# Patient Record
Sex: Female | Born: 1955 | Race: White | Hispanic: No | Marital: Married | Smoking: Never smoker
Health system: Southern US, Community
[De-identification: ages and names within clinical notes are randomized; demographics above are authoritative.]

## PROBLEM LIST (undated history)

## (undated) DIAGNOSIS — Z460 Encounter for fitting and adjustment of spectacles and contact lenses: Secondary | ICD-10-CM

## (undated) DIAGNOSIS — E785 Hyperlipidemia, unspecified: Secondary | ICD-10-CM

## (undated) DIAGNOSIS — H3530 Unspecified macular degeneration: Secondary | ICD-10-CM

## (undated) HISTORY — PX: COLONOSCOPY: SHX174

## (undated) HISTORY — DX: Unspecified macular degeneration: H35.30

## (undated) HISTORY — DX: Hyperlipidemia, unspecified: E78.5

---

## 1997-06-16 HISTORY — PX: GANGLION CYST EXCISION: SHX1691

## 1997-06-30 ENCOUNTER — Other Ambulatory Visit: Admission: RE | Admit: 1997-06-30 | Discharge: 1997-06-30 | Payer: Self-pay | Admitting: Obstetrics and Gynecology

## 1997-07-01 ENCOUNTER — Other Ambulatory Visit: Admission: RE | Admit: 1997-07-01 | Discharge: 1997-07-01 | Payer: Self-pay | Admitting: Orthopedic Surgery

## 1999-02-22 ENCOUNTER — Other Ambulatory Visit: Admission: RE | Admit: 1999-02-22 | Discharge: 1999-02-22 | Payer: Self-pay | Admitting: Obstetrics and Gynecology

## 2000-03-07 ENCOUNTER — Other Ambulatory Visit: Admission: RE | Admit: 2000-03-07 | Discharge: 2000-03-07 | Payer: Self-pay | Admitting: Obstetrics and Gynecology

## 2001-05-01 ENCOUNTER — Other Ambulatory Visit: Admission: RE | Admit: 2001-05-01 | Discharge: 2001-05-01 | Payer: Self-pay | Admitting: Obstetrics and Gynecology

## 2002-05-13 ENCOUNTER — Other Ambulatory Visit: Admission: RE | Admit: 2002-05-13 | Discharge: 2002-05-13 | Payer: Self-pay | Admitting: Obstetrics and Gynecology

## 2003-05-18 ENCOUNTER — Other Ambulatory Visit: Admission: RE | Admit: 2003-05-18 | Discharge: 2003-05-18 | Payer: Self-pay | Admitting: Obstetrics and Gynecology

## 2004-04-22 ENCOUNTER — Ambulatory Visit (HOSPITAL_COMMUNITY): Admission: RE | Admit: 2004-04-22 | Discharge: 2004-04-22 | Payer: Self-pay | Admitting: Gastroenterology

## 2004-04-22 ENCOUNTER — Encounter (INDEPENDENT_AMBULATORY_CARE_PROVIDER_SITE_OTHER): Payer: Self-pay | Admitting: Specialist

## 2004-05-18 ENCOUNTER — Other Ambulatory Visit: Admission: RE | Admit: 2004-05-18 | Discharge: 2004-05-18 | Payer: Self-pay | Admitting: Obstetrics and Gynecology

## 2004-09-13 ENCOUNTER — Ambulatory Visit: Payer: Self-pay | Admitting: Family Medicine

## 2006-08-14 ENCOUNTER — Ambulatory Visit: Payer: Self-pay | Admitting: Family Medicine

## 2006-08-14 DIAGNOSIS — L258 Unspecified contact dermatitis due to other agents: Secondary | ICD-10-CM | POA: Insufficient documentation

## 2006-08-24 ENCOUNTER — Ambulatory Visit: Payer: Self-pay | Admitting: Family Medicine

## 2006-08-28 ENCOUNTER — Telehealth (INDEPENDENT_AMBULATORY_CARE_PROVIDER_SITE_OTHER): Payer: Self-pay | Admitting: *Deleted

## 2006-08-31 ENCOUNTER — Ambulatory Visit: Payer: Self-pay | Admitting: Family Medicine

## 2006-08-31 DIAGNOSIS — R21 Rash and other nonspecific skin eruption: Secondary | ICD-10-CM | POA: Insufficient documentation

## 2006-09-03 ENCOUNTER — Telehealth (INDEPENDENT_AMBULATORY_CARE_PROVIDER_SITE_OTHER): Payer: Self-pay | Admitting: *Deleted

## 2006-09-05 LAB — CONVERTED CEMR LAB: Anti Nuclear Antibody(ANA): NEGATIVE

## 2006-09-07 LAB — CONVERTED CEMR LAB
AST: 19 units/L (ref 0–37)
Albumin: 4.1 g/dL (ref 3.5–5.2)
BUN: 7 mg/dL (ref 6–23)
Basophils Absolute: 0 10*3/uL (ref 0.0–0.1)
Chloride: 111 meq/L (ref 96–112)
Eosinophils Absolute: 0.2 10*3/uL (ref 0.0–0.6)
GFR calc non Af Amer: 93 mL/min
HCT: 42 % (ref 36.0–46.0)
MCHC: 35.6 g/dL (ref 30.0–36.0)
MCV: 94 fL (ref 78.0–100.0)
Monocytes Relative: 7.2 % (ref 3.0–11.0)
Neutrophils Relative %: 71.4 % (ref 43.0–77.0)
Platelets: 292 10*3/uL (ref 150–400)
Potassium: 4 meq/L (ref 3.5–5.1)
RBC: 4.47 M/uL (ref 3.87–5.11)
Sed Rate: 8 mm/hr (ref 0–25)
Sodium: 144 meq/L (ref 135–145)

## 2006-09-24 ENCOUNTER — Encounter: Payer: Self-pay | Admitting: Family Medicine

## 2007-07-17 ENCOUNTER — Emergency Department (HOSPITAL_COMMUNITY): Admission: EM | Admit: 2007-07-17 | Discharge: 2007-07-17 | Payer: Self-pay | Admitting: Family Medicine

## 2007-09-24 ENCOUNTER — Encounter: Payer: Self-pay | Admitting: Family Medicine

## 2007-10-09 ENCOUNTER — Encounter: Payer: Self-pay | Admitting: Family Medicine

## 2007-12-09 ENCOUNTER — Ambulatory Visit: Payer: Self-pay | Admitting: Family Medicine

## 2007-12-09 DIAGNOSIS — E785 Hyperlipidemia, unspecified: Secondary | ICD-10-CM | POA: Insufficient documentation

## 2008-01-01 ENCOUNTER — Telehealth: Payer: Self-pay | Admitting: Family Medicine

## 2008-01-01 ENCOUNTER — Emergency Department (HOSPITAL_COMMUNITY): Admission: EM | Admit: 2008-01-01 | Discharge: 2008-01-01 | Payer: Self-pay | Admitting: Emergency Medicine

## 2008-01-02 ENCOUNTER — Encounter (INDEPENDENT_AMBULATORY_CARE_PROVIDER_SITE_OTHER): Payer: Self-pay | Admitting: *Deleted

## 2008-01-03 ENCOUNTER — Encounter (INDEPENDENT_AMBULATORY_CARE_PROVIDER_SITE_OTHER): Payer: Self-pay | Admitting: *Deleted

## 2008-01-13 ENCOUNTER — Encounter: Payer: Self-pay | Admitting: Family Medicine

## 2008-03-06 ENCOUNTER — Encounter: Payer: Self-pay | Admitting: Family Medicine

## 2008-05-27 LAB — HM MAMMOGRAPHY: HM Mammogram: NORMAL

## 2008-09-30 ENCOUNTER — Ambulatory Visit (HOSPITAL_COMMUNITY): Admission: RE | Admit: 2008-09-30 | Discharge: 2008-09-30 | Payer: Self-pay | Admitting: Obstetrics and Gynecology

## 2009-03-31 ENCOUNTER — Encounter (INDEPENDENT_AMBULATORY_CARE_PROVIDER_SITE_OTHER): Payer: Self-pay | Admitting: *Deleted

## 2009-03-31 ENCOUNTER — Ambulatory Visit: Payer: Self-pay | Admitting: Family Medicine

## 2009-03-31 DIAGNOSIS — M722 Plantar fascial fibromatosis: Secondary | ICD-10-CM | POA: Insufficient documentation

## 2009-03-31 LAB — HM COLONOSCOPY

## 2009-04-09 ENCOUNTER — Ambulatory Visit: Payer: Self-pay | Admitting: Family Medicine

## 2009-04-12 ENCOUNTER — Telehealth (INDEPENDENT_AMBULATORY_CARE_PROVIDER_SITE_OTHER): Payer: Self-pay | Admitting: *Deleted

## 2009-04-12 LAB — CONVERTED CEMR LAB: Fecal Occult Bld: NEGATIVE

## 2009-07-05 ENCOUNTER — Ambulatory Visit: Payer: Self-pay | Admitting: Family Medicine

## 2009-07-06 LAB — CONVERTED CEMR LAB
ALT: 23 units/L (ref 0–35)
AST: 24 units/L (ref 0–37)
Alkaline Phosphatase: 65 units/L (ref 39–117)
Bilirubin, Direct: 0.1 mg/dL (ref 0.0–0.3)
Cholesterol: 170 mg/dL (ref 0–200)
Total Protein: 7.2 g/dL (ref 6.0–8.3)

## 2009-07-26 ENCOUNTER — Telehealth: Payer: Self-pay | Admitting: Family Medicine

## 2009-07-26 ENCOUNTER — Ambulatory Visit: Payer: Self-pay | Admitting: Family Medicine

## 2009-07-26 LAB — CONVERTED CEMR LAB
Ketones, urine, test strip: NEGATIVE
Nitrite: NEGATIVE
Specific Gravity, Urine: 1.015
Urobilinogen, UA: 0.2
pH: 6

## 2009-07-27 ENCOUNTER — Encounter: Payer: Self-pay | Admitting: Family Medicine

## 2009-08-09 ENCOUNTER — Encounter: Payer: Self-pay | Admitting: Family Medicine

## 2009-10-05 ENCOUNTER — Ambulatory Visit: Payer: Self-pay | Admitting: Family Medicine

## 2009-10-06 LAB — CONVERTED CEMR LAB
ALT: 21 units/L (ref 0–35)
Alkaline Phosphatase: 60 units/L (ref 39–117)
Bilirubin, Direct: 0.1 mg/dL (ref 0.0–0.3)
Total Bilirubin: 0.5 mg/dL (ref 0.3–1.2)
VLDL: 24.4 mg/dL (ref 0.0–40.0)

## 2009-10-21 ENCOUNTER — Telehealth (INDEPENDENT_AMBULATORY_CARE_PROVIDER_SITE_OTHER): Payer: Self-pay | Admitting: *Deleted

## 2010-01-04 ENCOUNTER — Ambulatory Visit: Payer: Self-pay | Admitting: Family Medicine

## 2010-01-07 LAB — CONVERTED CEMR LAB
Albumin: 4.2 g/dL (ref 3.5–5.2)
Alkaline Phosphatase: 58 units/L (ref 39–117)
LDL Cholesterol: 80 mg/dL (ref 0–99)
Total CHOL/HDL Ratio: 3
Triglycerides: 73 mg/dL (ref 0.0–149.0)

## 2010-02-13 LAB — CONVERTED CEMR LAB
AST: 34 units/L (ref 0–37)
Albumin: 4.3 g/dL (ref 3.5–5.2)
Albumin: 4.3 g/dL (ref 3.5–5.2)
Alkaline Phosphatase: 57 units/L (ref 39–117)
BUN: 11 mg/dL (ref 6–23)
Basophils Absolute: 0 10*3/uL (ref 0.0–0.1)
Chloride: 105 meq/L (ref 96–112)
Chloride: 106 meq/L (ref 96–112)
Cholesterol: 207 mg/dL — ABNORMAL HIGH (ref 0–200)
Cholesterol: 219 mg/dL (ref 0–200)
Creatinine, Ser: 0.7 mg/dL (ref 0.4–1.2)
Direct LDL: 149.7 mg/dL
Eosinophils Absolute: 0.2 10*3/uL (ref 0.0–0.7)
GFR calc non Af Amer: 92 mL/min (ref >60)
Glucose, Bld: 107 mg/dL — ABNORMAL HIGH (ref 70–99)
HCT: 43.1 % (ref 36.0–46.0)
HCT: 43.8 % (ref 36.0–46.0)
Hemoglobin: 14.3 g/dL (ref 12.0–15.0)
Lymphs Abs: 1.2 10*3/uL (ref 0.7–4.0)
MCHC: 33.2 g/dL (ref 30.0–36.0)
MCV: 96 fL (ref 78.0–100.0)
Neutro Abs: 3.2 10*3/uL (ref 1.4–7.7)
Platelets: 246 10*3/uL (ref 150.0–400.0)
Platelets: 246 10*3/uL (ref 150–400)
Potassium: 4 meq/L (ref 3.5–5.1)
Potassium: 4.2 meq/L (ref 3.5–5.1)
RDW: 11.9 % (ref 11.5–14.6)
RDW: 12 % (ref 11.5–14.6)
TSH: 2.21 microintl units/mL (ref 0.35–5.50)
Total Bilirubin: 0.5 mg/dL (ref 0.3–1.2)
Total CHOL/HDL Ratio: 4
Total Protein: 7.2 g/dL (ref 6.0–8.3)
Triglycerides: 98 mg/dL (ref 0–149)
VLDL: 17.6 mg/dL (ref 0.0–40.0)

## 2010-02-17 NOTE — Progress Notes (Signed)
Summary: Triage: ? Side effect to med  Phone Note Call from Patient Call back at [phone]   Caller: Patient Summary of Call: Message left on Triage VM: Patient with high fever (99.4-102.3), abdominal pain, back pain, dizziness and weakness. Patient was recently started on Trilipix and wonders if sympotoms related to med. Please call patient states she really feels bad.    Chrae Malloy  July 26, 2009 9:23 AM   Follow-up for Phone Call        SPOKE WITH PT ADVISE PT TO D/C MED. OFFER PT OV TODAY  WITH ANOTHER PHYSICIAN PT DECLINE, PT COMING IN AM PT ADVISE MONITOR symptoms AND IF WORSEN ADVISE ED PT OK..............Marland KitchenFelecia Deloach CMA  July 26, 2009 9:39 AM

## 2010-02-17 NOTE — Assessment & Plan Note (Signed)
Summary: temp - body ache/cbs   Vital Signs:  Patient profile:   55 year old female Weight:      152 pounds Temp:     97.6 degrees F oral BP sitting:   130 / 74  (left arm)  Vitals Entered By: Doristine Devoid (July 26, 2009 12:02 PM) CC: fever up to 102 some lower back pain also- no nausea vomiting    History of Present Illness: Rachel Kline here today w/ fever to 102.3.  sxs started Friday afternoon w/ back aches.  fever started Sat and persisted through Sunday.  no fever this AM.  + lightheaded, anorexia, fatigue.  'i just don't feel good'.  no N/V/D.  no abd pain.  no URI sxs.  denies dysuria, frequency, urgency.  Current Medications (verified): 1)  Ambien 10 Mg  Tabs (Zolpidem Tartrate) .... As Needed 2)  Glycopyrrolate 0.2 Mg/ml Soln (Glycopyrrolate) .... As Needed. 3)  Mvi 4)  Vision Formula  Tabs (Multiple Vitamins-Minerals) .... Two Times A Day 5)  Red Yeast Rice Extract  Powd (Red Yeast Rice Extract) .... Two Times A Day 6)  Coq10 30 Mg Caps (Coenzyme Q10) .... Two Times A Day 7)  Omega-3 350 Mg Caps (Omega-3 Fatty Acids) 8)  Aspir-Low 81 Mg Tbec (Aspirin) .... Two Times A Day 9)  Cetirizine Hcl 10 Mg Tabs (Cetirizine Hcl) .... Daily 10)  Miralax  Powd (Polyethylene Glycol 3350) .... As Needed 11)  Pravachol 40 Mg Tabs (Pravastatin Sodium) .Marland Kitchen.. 1 By Mouth At Bedtime. 12)  Trilipix 135 Mg Cpdr (Choline Fenofibrate) .Marland Kitchen.. 1 By Mouth Once Daily.  Allergies (verified): No Known Drug Allergies  Review of Systems      See HPI  Physical Exam  General:  Well-developed,well-nourished,in no acute distress; alert,appropriate and cooperative throughout examination Head:  Normocephalic and atraumatic without obvious abnormalities. No apparent alopecia or balding.  no TTP over sinuses Eyes:  no injxn or inflammation Ears:  External ear exam shows no significant lesions or deformities.  Otoscopic examination reveals clear canals, tympanic membranes are intact bilaterally without  bulging, retraction, inflammation or discharge. Hearing is grossly normal bilaterally. Neck:  No deformities, masses, or tenderness noted Lungs:  Normal respiratory effort, chest expands symmetrically. Lungs are clear to auscultation, no crackles or wheezes. Heart:  Normal rate and regular rhythm. S1 and S2 normal without gallop, murmur, click, rub or other extra sounds. Abdomen:  soft, NT/ND, +BS.  + CVA tenderness   Impression & Recommendations:  Problem # 1:  FEVER (ICD-780.60) Assessment New apparent source is urine given + leuks.  also w/ CVA tenderness.  start cipro.  send urine for cx.  reviewed supportive care and red flags that should prompt return.  Pt expresses understanding and is in agreement w/ this plan. Orders: Specimen Handling (04540) T-Culture, Urine ([id number]) UA Dipstick w/o Micro (manual) (81002)  Complete Medication List: 1)  Ambien 10 Mg Tabs (Zolpidem tartrate) .... As needed 2)  Glycopyrrolate 0.2 Mg/ml Soln (Glycopyrrolate) .... As needed. 3)  Mvi  4)  Vision Formula Tabs (Multiple vitamins-minerals) .... Two times a day 5)  Red Yeast Rice Extract Powd (Red yeast rice extract) .... Two times a day 6)  Coq10 30 Mg Caps (Coenzyme q10) .... Two times a day 7)  Omega-3 350 Mg Caps (Omega-3 fatty acids) 8)  Aspir-low 81 Mg Tbec (Aspirin) .... Two times a day 9)  Cetirizine Hcl 10 Mg Tabs (Cetirizine hcl) .... Daily 10)  Miralax Powd (Polyethylene glycol 3350) .Marland KitchenMarland KitchenMarland Kitchen  As needed 11)  Pravachol 40 Mg Tabs (Pravastatin sodium) .Marland Kitchen.. 1 by mouth at bedtime. 12)  Trilipix 135 Mg Cpdr (Choline fenofibrate) .Marland Kitchen.. 1 by mouth once daily. 13)  Cipro 500 Mg Tabs (Ciprofloxacin hcl) .Marland Kitchen.. 1 by mouth 2 times daily x5 days  Patient Instructions: 1)  Please call if symptoms continue or are worsening 2)  Start the Cipro two times a day as directed- take w/ food. 3)  Drink plenty of fluids 4)  Tylenol/Ibuprofen as needed for pain or fever 5)  Stop the Trilipix as this may be  causing muscle aches 6)  We'll notify you of your urine culture 7)  Call with any questions or concerns 8)  Hang in there! Prescriptions: CIPRO 500 MG TABS (CIPROFLOXACIN HCL) 1 by mouth 2 times daily x5 days  #10 x 0   Entered and Authorized by:   Neena Rhymes MD   Signed by:   Neena Rhymes MD on 07/26/2009   Method used:   Electronically to        [facility]* (retail)       [address]       [address] Shipping/mailing       [place], [place]  [address]       Ph: [phone]       Fax: [phone]   RxID:   [id number]   Laboratory Results   Urine Tests    Routine Urinalysis   Glucose: negative   (Normal Range: Negative) Bilirubin: negative   (Normal Range: Negative) Ketone: negative   (Normal Range: Negative) Spec. Gravity: 1.015   (Normal Range: 1.003-1.035) Blood: negative   (Normal Range: Negative) pH: 6.0   (Normal Range: 5.0-8.0) Protein: negative   (Normal Range: Negative) Urobilinogen: 0.2   (Normal Range: 0-1) Nitrite: negative   (Normal Range: Negative) Leukocyte Esterace: small   (Normal Range: Negative)

## 2010-02-17 NOTE — Assessment & Plan Note (Signed)
Summary: CPX,FASTING,UMR INS/RH....   Vital Signs:  Patient profile:   55 year old female Height:      65 inches Weight:      160 pounds BMI:     26.72 Temp:     97.7 degrees F oral Pulse rate:   82 / minute Pulse rhythm:   regular BP sitting:   122 / 84  (left arm) Cuff size:   large  Vitals Entered By: Army Fossa CMA (March 31, 2009 8:34 AM) CC: cpx, no pap.    History of Present Illness: Pt here for cpe, labs.  No pap--pt has gyn.    Preventive Screening-Counseling & Management  Alcohol-Tobacco     Alcohol drinks/day: <1     Smoking Status: never     Passive Smoke Exposure: no  Caffeine-Diet-Exercise     Caffeine use/day: 1     Does Patient Exercise: yes     Type of exercise: treadmill     Exercise (avg: min/session): 30-60     Times/week: <3     Exercise Counseling: to improve exercise regimen  Hep-HIV-STD-Contraception     HIV Risk: no     Dental Visit-last 6 months yes     Dental Care Counseling: not indicated; dental care within six months     SBE monthly: yes  Safety-Violence-Falls     Seat Belt Use: 100      Sexual History:  currently monogamous.    Current Medications (verified): 1)  Ambien 10 Mg  Tabs (Zolpidem Tartrate) .... As Needed 2)  Glycopyrrolate 0.2 Mg/ml Soln (Glycopyrrolate) .... As Needed. 3)  Mvi 4)  Vision Formula  Tabs (Multiple Vitamins-Minerals) .... Two Times A Day 5)  Red Yeast Rice Extract  Powd (Red Yeast Rice Extract) .... Two Times A Day 6)  Coq10 30 Mg Caps (Coenzyme Q10) .... Two Times A Day 7)  Omega-3 350 Mg Caps (Omega-3 Fatty Acids) 8)  Aspir-Low 81 Mg Tbec (Aspirin) .... Two Times A Day 9)  Cetirizine Hcl 10 Mg Tabs (Cetirizine Hcl) .... Daily 10)  Miralax  Powd (Polyethylene Glycol 3350) .... 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 Family History of CAD Female 1st degree relative <50 Family History Diabetes 1st degree relative Family History High cholesterol Family History Hypertension M=pancreatic cancer F--CHF, end stage renal failure  Social History: Reviewed history from 12/09/2007 and no changes required. 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 Eyes:  Denies blurring, discharge, double vision, eye irritation, eye pain, halos, itching,  light sensitivity, red eye, vision loss-1 eye, and vision loss-both eyes; optho--q1y. ENT:  Denies decreased hearing, difficulty swallowing, ear discharge, earache, hoarseness, nasal congestion, nosebleeds, postnasal drainage, ringing in ears, sinus pressure, and sore throat. CV:  Denies bluish discoloration of lips or nails, chest pain or discomfort, difficulty breathing at night, difficulty breathing while lying down, fainting, fatigue, leg cramps with exertion,  lightheadness, near fainting, palpitations, shortness of breath with exertion, swelling of feet, swelling of hands, and weight gain. Resp:  Denies chest discomfort, chest pain with inspiration, cough, coughing up blood, excessive snoring, hypersomnolence, morning headaches, pleuritic, shortness of breath, sputum productive, and wheezing. GI:  Denies abdominal pain, bloody stools, change in bowel habits, constipation, dark tarry stools, diarrhea, excessive appetite, gas, hemorrhoids, indigestion, loss of appetite, nausea, vomiting, vomiting blood, and yellowish skin color. GU:  Denies abnormal vaginal bleeding, decreased libido, discharge, dysuria, genital sores, hematuria, incontinence, nocturia, urinary frequency, and urinary hesitancy. MS:  Denies joint pain, joint redness, joint swelling, loss of strength, low back pain, mid back pain, muscle aches, muscle , cramps, muscle weakness, stiffness, and thoracic pain. Derm:  Denies changes in color of skin, changes in nail beds, dryness, excessive perspiration, flushing, hair loss, insect bite(s), itching, lesion(s), poor wound healing, and rash. Neuro:  Denies brief paralysis, difficulty with concentration, disturbances in coordination, falling down, headaches, inability to speak, memory loss, numbness, poor balance, seizures, sensation of room spinning, tingling, tremors, visual disturbances, and weakness. Psych:  Denies alternate hallucination ( auditory/visual), anxiety, depression, easily angered, easily tearful, irritability, mental problems, panic attacks, sense of great danger, suicidal thoughts/plans, thoughts of violence, unusual visions or sounds, and thoughts /plans of harming others. Endo:  Denies cold intolerance, excessive hunger, excessive thirst, excessive urination, heat intolerance, polyuria, and weight change. Heme:  Denies abnormal bruising, bleeding, enlarge lymph nodes, fevers, pallor, and skin discoloration. Allergy:  Denies hives or  rash, itching eyes, persistent infections, seasonal allergies, and sneezing.  Physical Exam  General:  Well-developed,well-nourished,in no acute distress; alert,appropriate and cooperative throughout examination Head:  Normocephalic and atraumatic without obvious abnormalities. No apparent alopecia or balding. Eyes:  pupils equal, pupils round, pupils reactive to light, and no injection.   Ears:  External ear exam shows no significant lesions or deformities.  Otoscopic examination reveals clear canals, tympanic membranes are intact bilaterally without bulging, retraction, inflammation or discharge. Hearing is grossly normal bilaterally. Nose:  External nasal examination shows no deformity or inflammation. Nasal mucosa are pink and moist without lesions or exudates. Mouth:  Oral mucosa and oropharynx without lesions or exudates.  Teeth in good repair. Neck:  No deformities, masses, or tenderness noted.no carotid bruits.   Chest Wall:  No deformities, masses, or tenderness noted. Breasts:  gyn Lungs:  Normal respiratory effort, chest expands symmetrically. Lungs are clear to auscultation, no crackles or wheezes. Heart:  Normal rate and regular rhythm. S1 and S2 normal without gallop, murmur, click, rub or other extra sounds. Abdomen:  Bowel sounds positive,abdomen soft and non-tender without masses, organomegaly or hernias noted. Rectal:  gyn Genitalia:  gyn Msk:  No deformity or scoliosis noted of thoracic or lumbar spine.   Pulses:  R posterior tibial normal, R dorsalis pedis normal, R carotid normal, L posterior tibial normal, L dorsalis pedis normal, and L carotid normal.   Extremities:  No clubbing, cyanosis, edema, or deformity noted with normal full range of motion of all joints.   Neurologic:  No cranial nerve deficits noted. Station and gait are normal. Plantar reflexes  are down-going bilaterally. DTRs are symmetrical throughout. Sensory, motor and coordinative functions appear  intact. Skin:  Intact without suspicious lesions or rashes Cervical Nodes:  No lymphadenopathy noted Axillary Nodes:  No palpable lymphadenopathy Psych:  Cognition and judgment appear intact. Alert and cooperative with normal attention span and concentration. No apparent delusions, illusions, hallucinations   Impression & Recommendations:  Problem # 1:  PREVENTIVE HEALTH CARE (ICD-V70.0)  Orders: Venipuncture ([address]) TLB-Lipid Panel (80061-LIPID) TLB-BMP (Basic Metabolic Panel-BMET) (80048-METABOL) TLB-CBC Platelet - w/Differential (85025-CBCD) TLB-Hepatic/Liver Function Pnl (80076-HEPATIC) TLB-TSH (Thyroid Stimulating Hormone) (84443-TSH) EKG w/ Interpretation (93000)  Problem # 2:  HYPERLIPIDEMIA (ICD-272.4)  Orders: Venipuncture ([address]) TLB-Lipid Panel (80061-LIPID) TLB-BMP (Basic Metabolic Panel-BMET) (80048-METABOL) TLB-CBC Platelet - w/Differential (85025-CBCD) TLB-Hepatic/Liver Function Pnl (80076-HEPATIC) TLB-TSH (Thyroid Stimulating Hormone) (84443-TSH) EKG w/ Interpretation (93000)  Labs Reviewed: SGOT: 34 (12/09/2007)   SGPT: 29 (12/09/2007)   HDL:47.2 (12/09/2007)  LDL:DEL (12/09/2007)  Chol:219 (12/09/2007)  Trig:98 (12/09/2007)  Problem # 3:  FASCIITIS, PLANTAR (ICD-728.71)  Discussed use of gel inserts, ice massage, and stretching exercises.   Orders: EKG w/ Interpretation (93000)  Complete Medication List: 1)  Ambien 10 Mg Tabs (Zolpidem tartrate) .... As needed 2)  Glycopyrrolate 0.2 Mg/ml Soln (Glycopyrrolate) .... As needed. 3)  Mvi  4)  Vision Formula Tabs (Multiple vitamins-minerals) .... Two times a day 5)  Red Yeast Rice Extract Powd (Red yeast rice extract) .... Two times a day 6)  Coq10 30 Mg Caps (Coenzyme q10) .... Two times a day 7)  Omega-3 350 Mg Caps (Omega-3 fatty acids) 8)  Aspir-low 81 Mg Tbec (Aspirin) .... Two times a day 9)  Cetirizine Hcl 10 Mg Tabs (Cetirizine hcl) .... Daily 10)  Miralax Powd (Polyethylene glycol 3350) ....  As needed   EKG  Procedure date:  03/31/2009  Findings:      Normal sinus rhythm with rate of:  69 bpm    Last Flu Vaccine:  Fluvax 3+ (10/30/2007 9:59:56 AM) Flu Vaccine Result Date:  10/28/2008 Flu Vaccine Result:  given Flu Vaccine Next Due:  1 yr Last PAP:  Normal (06/05/2007 9:57:56 AM) PAP Result Date:  05/26/2008 PAP Result:  normal PAP Next Due:  1 yr Last Mammogram:  Normal Bilateral (05/29/2007 9:57:56 AM) Mammogram Result Date:  05/27/2008 Mammogram Result:  normal Mammogram Next Due:  1 yr  Appended Document: CPX,FASTING,UMR INS/RH....  Laboratory Results   Urine Tests   Date/Time Reported: March 31, 2009 10:52 AM   Routine Urinalysis   Color: yellow Appearance: Clear Glucose: negative   (Normal Range: Negative) Bilirubin: negative   (Normal Range: Negative) Ketone: negative   (Normal Range: Negative) Spec. Gravity: <1.005   (Normal Range: 1.003-1.035) Blood: negative   (Normal Range: Negative) pH: 6.0   (Normal Range: 5.0-8.0) Protein: negative   (Normal Range: Negative) Urobilinogen: negative   (Normal Range: 0-1) Nitrite: negative   (Normal Range: Negative) Leukocyte Esterace: negative   (Normal Range: Negative)    Comments: Floydene Flock  March 31, 2009 10:52 AM

## 2010-02-17 NOTE — Procedures (Signed)
Summary: Colonoscopy/[facility]  Colonoscopy/[facility]   Imported By: Lanelle Bal 08/25/2009 10:03:45  _____________________________________________________________________  External Attachment:    Type:   Image     Comment:   External Document

## 2010-02-17 NOTE — Letter (Signed)
Summary: [facility]: Immunoassay Fecal Occult Blood (iFOB) Order Form  [facility] [place]  [address] [place], [place] [address]   Phone: [phone]  Fax: [phone]      [facility]: Immunoassay Fecal Occult Blood (iFOB) Order Form   March 31, 2009 MRN: [id number]   [name] 11-15-1955   Physicican Name:____Yvonne Lowne,DO_____________________  Diagnosis Code:_____v76.51_____________________      Army Fossa CMA

## 2010-02-17 NOTE — Progress Notes (Signed)
Summary: Med Concerns  Phone Note Call from Patient Call back at [phone]   Summary of Call: Message left on triage VM: Patient would like to know if she is to continue crestor and if yes she needs a rx   Shonna Chock CMA  October 21, 2009 2:19 PM   Follow-up for Phone Call        I spoke with patient and informed her that based on labs from 10/05/09 she is to continue Crestor for 3 months then recheck labs. Patient aware rx sent to pharmacy and schdeuled appointment for 12/2009 to recheck labs Follow-up by: Shonna Chock CMA,  October 21, 2009 3:46 PM    Prescriptions: CRESTOR 10 MG TABS (ROSUVASTATIN CALCIUM) take one tablet at bedtime  #90 x 0   Entered by:   Shonna Chock CMA   Authorized by:   Loreen Freud DO   Signed by:   Shonna Chock CMA on 10/21/2009   Method used:   Electronically to        [facility]* (retail)       [address]       [address] Shipping/mailing       [place], [place]  [address]       Ph: [phone]       Fax: [phone]   RxID:   [id number]

## 2010-02-17 NOTE — Progress Notes (Signed)
Summary: Lab Results   Phone Note Outgoing Call   Call placed by: Army Fossa CMA,  April 12, 2009 11:16 AM Reason for Call: Discuss lab or test results Summary of Call: Regarding lab results, LMTCB:  Ideally your LDL (bad cholesterol) should be <_100___, your HDL (good cholesterol) should be >__40_ and your triglycerides should be< 150.  Diet and exercise will increase HDL and decrease the LDL and triglycerides. Read Dr. Danice Goltz book--Eat Drink and Be Healthy.  Start pravachol 40mg  #30,  1 by mouth at bedtime.2  refills We will recheck labs in __3_ months.  lipid, hep 272.4 Signed by Loreen Freud DO on 04/10/2009 at 1:58 PM  Follow-up for Phone Call        Pt is aware. Army Fossa CMA  April 13, 2009 10:32 AM     New/Updated Medications: PRAVACHOL 40 MG TABS (PRAVASTATIN SODIUM) 1 by mouth at bedtime. Prescriptions: PRAVACHOL 40 MG TABS (PRAVASTATIN SODIUM) 1 by mouth at bedtime.  #30 x 2   Entered by:   Army Fossa CMA   Authorized by:   Loreen Freud DO   Signed by:   Army Fossa CMA on 04/13/2009   Method used:   Electronically to        [facility]* (retail)       [address]       [address] Shipping/mailing       [place], [place]  [address]       Ph: [phone]       Fax: [phone]   RxID:   [id number]

## 2010-02-24 ENCOUNTER — Encounter: Payer: Self-pay | Admitting: Family Medicine

## 2010-03-09 NOTE — Consult Note (Signed)
Summary: [facility]  [facility]   Imported By: Maryln Gottron 03/01/2010 10:44:46  _____________________________________________________________________  External Attachment:    Type:   Image     Comment:   External Document

## 2010-03-26 ENCOUNTER — Encounter: Payer: Self-pay | Admitting: Family Medicine

## 2010-04-06 ENCOUNTER — Encounter: Payer: Self-pay | Admitting: Family Medicine

## 2010-05-17 DEATH — deceased

## 2010-05-30 ENCOUNTER — Other Ambulatory Visit: Payer: Self-pay | Admitting: Obstetrics and Gynecology

## 2010-06-03 NOTE — Op Note (Signed)
Kline, Rachel              ACCOUNT NO.:  [id number]   MEDICAL RECORD NO.:  [id number]          PATIENT TYPE:  AMB   LOCATION:  ENDO                         FACILITY:  [facility]   PHYSICIAN:  Petra Kuba, M.D.    DATE OF BIRTH:  1955/08/05   DATE OF PROCEDURE:  04/22/2004  DATE OF DISCHARGE:                                 OPERATIVE REPORT   PROCEDURE:  Colonoscopy with polypectomy.   INDICATION:  Family history of colon polyps, due for colonic screening.  Consent was signed after risks, benefits, methods, options thoroughly  discussed in the office.   MEDICINES USED:  1.  Demerol 100.  2.  Versed 10.   PROCEDURE:  Rectal inspection is pertinent for external hemorrhoids.  Digital exam was negative.  Video pediatric adjustable colonoscope was  inserted and with abdominal pressure, advanced to the cecum.  No obvious  abnormality was seen on insertion.  The cecum was identified by the  appendiceal orifice and the ileocecal valve.  Prep was adequate.  There was  some liquid stool that required washing and suctioning.  The scope was  slowly withdrawn.  The cecum was normal.  In the more proximal ascending,  questionable small sessile polyp was seen and was hot biopsied x 1.  The  scope was slowly withdrawn.  In the mid transverse, a moderate size,  pedunculated polyp was seen, snared, electrocautery applied, and the polyp  was suctioned onto the head of the scope after removal, and the scope was  removed and recovered put in a second container.  The scope was reinserted.  Again with some mild difficulty due to some looping and tortuosity, the  polypectomy site was brought into view.  It had a nice white coagulum  without any residual polypoid tissue.  The scope was further withdrawn.  No  additional findings were seen as we slowly withdrew back to the rectum.  Anorectal pull-through and retroflexion confirmed some small hemorrhoids.  The scope was straightened and readvanced a  short ways up the left side of  the colon.  Air was suctioned, scope removed.  The patient tolerated the  procedure well.  There was no obvious immediate complication.   ENDOSCOPIC DIAGNOSES:  1.  Internal-external small hemorrhoids.  2.  Tortuous looping colon.  3.  Medium pedunculated mid transverse polyp, snared.  4.  Ascending proximal questionable small polyp, hot biopsied.  5.  Otherwise within normal limits to the cecum.   PLAN:  Await pathology to determine future colonic screening.  Happy to see  back p.r.n.  Otherwise, return care to Drs. Dareen Piano and Shumway for the  customary health care maintenance to include yearly rectals and guaiacs.      MEM/MEDQ  D:  04/22/2004  T:  04/22/2004  Job:  [id number]   cc:   Malva Limes, M.D.  [address], [address]  [place]  [place] [address]  Fax: [phone]   Titus Dubin. Alwyn Ren, M.D. [facility]

## 2010-06-25 ENCOUNTER — Encounter: Payer: Self-pay | Admitting: Family Medicine

## 2010-06-29 ENCOUNTER — Encounter: Payer: Self-pay | Admitting: Family Medicine

## 2010-06-29 ENCOUNTER — Ambulatory Visit (INDEPENDENT_AMBULATORY_CARE_PROVIDER_SITE_OTHER): Payer: 59 | Admitting: Family Medicine

## 2010-06-29 VITALS — BP 128/80 | HR 62 | Temp 97.0°F | Ht 64.0 in | Wt 150.6 lb

## 2010-06-29 DIAGNOSIS — Z0000 Encounter for general adult medical examination without abnormal findings: Secondary | ICD-10-CM

## 2010-06-29 DIAGNOSIS — E785 Hyperlipidemia, unspecified: Secondary | ICD-10-CM

## 2010-06-29 LAB — HEPATIC FUNCTION PANEL
AST: 31 U/L (ref 0–37)
Albumin: 4.7 g/dL (ref 3.5–5.2)
Alkaline Phosphatase: 63 U/L (ref 39–117)
Total Protein: 7.3 g/dL (ref 6.0–8.3)

## 2010-06-29 LAB — BASIC METABOLIC PANEL
CO2: 28 mEq/L (ref 19–32)
Calcium: 9.3 mg/dL (ref 8.4–10.5)
GFR: 109.43 mL/min (ref >60.00)
Sodium: 139 mEq/L (ref 135–145)

## 2010-06-29 LAB — CBC WITH DIFFERENTIAL/PLATELET
Basophils Relative: 1 % (ref 0.0–3.0)
Hemoglobin: 14.1 g/dL (ref 12.0–15.0)
Lymphocytes Relative: 29.4 % (ref 12.0–46.0)
Monocytes Relative: 7 % (ref 3.0–12.0)
Neutro Abs: 3 10*3/uL (ref 1.4–7.7)
RBC: 4.3 Mil/uL (ref 3.87–5.11)

## 2010-06-29 LAB — POCT URINALYSIS DIPSTICK
Bilirubin, UA: NEGATIVE
Ketones, UA: NEGATIVE
Leukocytes, UA: NEGATIVE
Nitrite, UA: NEGATIVE
Protein, UA: NEGATIVE

## 2010-06-29 LAB — TSH: TSH: 1.29 u[IU]/mL (ref 0.35–5.50)

## 2010-06-29 LAB — LIPID PANEL: VLDL: 12.6 mg/dL (ref 0.0–40.0)

## 2010-06-29 NOTE — Patient Instructions (Signed)

## 2010-06-29 NOTE — Progress Notes (Signed)
  Subjective:     Rachel Kline is a 55 y.o. female and is here for a comprehensive physical exam. The patient reports no problems.  History   Social History  . Marital Status: Married    Spouse Name: N/A    Number of Children: N/A  . 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 Smoking status: Never Smoker   . Smokeless tobacco: Not on file  . Alcohol Use: Yes     rare  . Drug Use: No  . Sexually Active: Yes -- Female partner(s)   Other Topics Concern  . Not on file   Social History Narrative  . No narrative on file   Health Maintenance  Topic Date Due  . Mammogram  05/28/2010  . Influenza Vaccine  10/17/2010  . Pap Smear  05/29/2013  . Tetanus/tdap  02/22/2016  . Colonoscopy  04/01/2019    The following portions of the patient's history were reviewed and updated as appropriate: allergies, current medications, past family history, past medical history, past social history, past surgical history and problem list.  Review of Systems Review of Systems  Constitutional: Negative for activity change, appetite change and fatigue.  HENT: Negative for hearing loss, congestion, tinnitus and ear discharge.  dentist q53m Eyes: Negative for visual disturbance (see optho q1y -- vision corrected to 20/20 with glasses).  Respiratory: Negative for cough, chest tightness and shortness of breath.   Cardiovascular: Negative for chest pain, palpitations and leg swelling.  Gastrointestinal: Negative for abdominal pain, diarrhea, constipation and abdominal distention.  Genitourinary: Negative for urgency, frequency, decreased urine volume and difficulty urinating.  Musculoskeletal: Negative for back pain, arthralgias and gait problem.  Skin: Negative for color change, pallor and rash.  Neurological: Negative for dizziness, light-headedness, numbness and headaches.  Hematological: Negative for  adenopathy. Does not bruise/bleed easily.  Psychiatric/Behavioral: Negative for suicidal ideas, confusion, sleep disturbance, self-injury, dysphoric mood, decreased concentration and agitation.       Objective:   BP 128/80  Pulse 62  Temp(Src) 97 F (36.1 C) (Oral)  Ht 5\' 4"  (1.626 m)  Wt 150 lb 9.6 oz (68.312 kg)  BMI 25.85 kg/m2  SpO2 97% General appearance: alert, cooperative, appears stated age and no distress Head: Normocephalic, without obvious abnormality, atraumatic Eyes: conjunctivae/corneas clear. PERRL, EOM's intact. Fundi benign. Ears: normal TM's and external ear canals both ears Nose: Nares normal. Septum midline. Mucosa normal. No drainage or sinus tenderness. Throat: lips, mucosa, and tongue normal; teeth and gums normal Neck: no adenopathy, no carotid bruit, no JVD, supple, symmetrical, trachea midline and thyroid not enlarged, symmetric, no tenderness/mass/nodules Lungs: clear to auscultation bilaterally Breasts: gyn Heart: regular rate and rhythm, S1, S2 normal, no murmur, click, rub or gallop Abdomen: soft, non-tender; bowel sounds normal; no masses,  no organomegaly Pelvic: gyn Extremities: extremities normal, atraumatic, no cyanosis or edema Pulses: 2+ and symmetric Skin: Skin color, texture, turgor normal. No rashes or lesions Lymph nodes: Cervical, supraclavicular, and axillary nodes normal. Neurologic: Grossly normal psych--no depression, anxiety, not suicidal    Assessment:    Healthy female exam.     Hyperlipidemia Plan:    ghm utd Pap and mammo per gyn Check fasting labs See After Visit Summary for Counseling Recommendations

## 2010-07-15 ENCOUNTER — Other Ambulatory Visit: Payer: Self-pay | Admitting: Family Medicine

## 2010-12-21 ENCOUNTER — Other Ambulatory Visit: Payer: Self-pay | Admitting: Family Medicine

## 2010-12-21 DIAGNOSIS — E785 Hyperlipidemia, unspecified: Secondary | ICD-10-CM

## 2010-12-22 ENCOUNTER — Other Ambulatory Visit (INDEPENDENT_AMBULATORY_CARE_PROVIDER_SITE_OTHER): Payer: 59

## 2010-12-22 DIAGNOSIS — E785 Hyperlipidemia, unspecified: Secondary | ICD-10-CM

## 2010-12-22 LAB — LIPID PANEL
LDL Cholesterol: 77 mg/dL (ref 0–99)
Total CHOL/HDL Ratio: 3
Triglycerides: 87 mg/dL (ref 0.0–149.0)

## 2010-12-22 LAB — BASIC METABOLIC PANEL
CO2: 28 mEq/L (ref 19–32)
Calcium: 9.3 mg/dL (ref 8.4–10.5)
Chloride: 102 mEq/L (ref 96–112)
Creatinine, Ser: 0.6 mg/dL (ref 0.4–1.2)
Glucose, Bld: 93 mg/dL (ref 70–99)

## 2010-12-22 LAB — HEPATIC FUNCTION PANEL
AST: 40 U/L — ABNORMAL HIGH (ref 0–37)
Albumin: 4.5 g/dL (ref 3.5–5.2)
Alkaline Phosphatase: 65 U/L (ref 39–117)
Bilirubin, Direct: 0 mg/dL (ref 0.0–0.3)
Total Bilirubin: 0.4 mg/dL (ref 0.3–1.2)

## 2010-12-22 NOTE — Progress Notes (Signed)
2

## 2011-01-18 ENCOUNTER — Other Ambulatory Visit: Payer: Self-pay | Admitting: Family Medicine

## 2011-03-17 ENCOUNTER — Telehealth: Payer: Self-pay | Admitting: Family Medicine

## 2011-03-17 NOTE — Telephone Encounter (Signed)
Order with codes was on last labs 272.4  Lipid, hep

## 2011-03-17 NOTE — Telephone Encounter (Signed)
Patient called & would like a lab appointment to get her cholesterol checked. Is that ok & can you give me a code please? Thanks [employer]

## 2011-03-17 NOTE — Telephone Encounter (Signed)
272.4 lipid, hep 

## 2011-03-30 ENCOUNTER — Other Ambulatory Visit (INDEPENDENT_AMBULATORY_CARE_PROVIDER_SITE_OTHER): Payer: 59

## 2011-03-30 DIAGNOSIS — E785 Hyperlipidemia, unspecified: Secondary | ICD-10-CM

## 2011-03-30 LAB — CBC WITH DIFFERENTIAL/PLATELET
Basophils Absolute: 0 10*3/uL (ref 0.0–0.1)
Eosinophils Relative: 3.4 % (ref 0.0–5.0)
Hemoglobin: 13.4 g/dL (ref 12.0–15.0)
Lymphocytes Relative: 35.3 % (ref 12.0–46.0)
Monocytes Relative: 7.8 % (ref 3.0–12.0)
Neutro Abs: 2.1 10*3/uL (ref 1.4–7.7)
Platelets: 249 10*3/uL (ref 150.0–400.0)
RDW: 12.3 % (ref 11.5–14.6)
WBC: 4.1 10*3/uL — ABNORMAL LOW (ref 4.5–10.5)

## 2011-03-30 LAB — LIPID PANEL
LDL Cholesterol: 76 mg/dL (ref 0–99)
Total CHOL/HDL Ratio: 2
Triglycerides: 68 mg/dL (ref 0.0–149.0)

## 2011-03-30 LAB — IBC PANEL
Iron: 64 ug/dL (ref 42–145)
Saturation Ratios: 14.6 % — ABNORMAL LOW (ref 20.0–50.0)

## 2011-03-30 LAB — VITAMIN B12: Vitamin B-12: 857 pg/mL (ref 211–911)

## 2011-04-19 ENCOUNTER — Other Ambulatory Visit (INDEPENDENT_AMBULATORY_CARE_PROVIDER_SITE_OTHER): Payer: 59

## 2011-04-19 DIAGNOSIS — E785 Hyperlipidemia, unspecified: Secondary | ICD-10-CM

## 2011-04-19 LAB — HEPATIC FUNCTION PANEL
Albumin: 4.2 g/dL (ref 3.5–5.2)
Total Bilirubin: 0.5 mg/dL (ref 0.3–1.2)

## 2011-06-30 ENCOUNTER — Ambulatory Visit (INDEPENDENT_AMBULATORY_CARE_PROVIDER_SITE_OTHER): Payer: 59 | Admitting: Family Medicine

## 2011-06-30 ENCOUNTER — Encounter: Payer: Self-pay | Admitting: Family Medicine

## 2011-06-30 VITALS — BP 118/66 | HR 77 | Temp 98.3°F | Ht 64.0 in | Wt 155.0 lb

## 2011-06-30 DIAGNOSIS — Z780 Asymptomatic menopausal state: Secondary | ICD-10-CM

## 2011-06-30 DIAGNOSIS — E785 Hyperlipidemia, unspecified: Secondary | ICD-10-CM

## 2011-06-30 DIAGNOSIS — N390 Urinary tract infection, site not specified: Secondary | ICD-10-CM

## 2011-06-30 DIAGNOSIS — Z0000 Encounter for general adult medical examination without abnormal findings: Secondary | ICD-10-CM

## 2011-06-30 LAB — BASIC METABOLIC PANEL
BUN: 12 mg/dL (ref 6–23)
CO2: 28 mEq/L (ref 19–32)
Calcium: 9.4 mg/dL (ref 8.4–10.5)
Creatinine, Ser: 0.7 mg/dL (ref 0.4–1.2)

## 2011-06-30 LAB — LIPID PANEL
HDL: 59.9 mg/dL (ref >39.00)
LDL Cholesterol: 68 mg/dL (ref 0–99)
Total CHOL/HDL Ratio: 2
Triglycerides: 60 mg/dL (ref 0.0–149.0)
VLDL: 12 mg/dL (ref 0.0–40.0)

## 2011-06-30 LAB — CBC WITH DIFFERENTIAL/PLATELET
Basophils Absolute: 0 10*3/uL (ref 0.0–0.1)
Basophils Relative: 0.9 % (ref 0.0–3.0)
Eosinophils Absolute: 0.1 10*3/uL (ref 0.0–0.7)
Lymphocytes Relative: 31.6 % (ref 12.0–46.0)
MCHC: 33.2 g/dL (ref 30.0–36.0)
Monocytes Absolute: 0.4 10*3/uL (ref 0.1–1.0)
Neutrophils Relative %: 57.6 % (ref 43.0–77.0)
Platelets: 238 10*3/uL (ref 150.0–400.0)
RBC: 4.64 Mil/uL (ref 3.87–5.11)

## 2011-06-30 LAB — POCT URINALYSIS DIPSTICK
Blood, UA: NEGATIVE
Nitrite, UA: NEGATIVE
Protein, UA: NEGATIVE
pH, UA: 7.5

## 2011-06-30 LAB — HEPATIC FUNCTION PANEL
Alkaline Phosphatase: 57 U/L (ref 39–117)
Bilirubin, Direct: 0.1 mg/dL (ref 0.0–0.3)
Total Bilirubin: 0.5 mg/dL (ref 0.3–1.2)

## 2011-06-30 MED ORDER — TAB-A-VITE/IRON PO TABS: 1.0000 | ORAL_TABLET | Freq: Every day | ORAL | Status: DC

## 2011-06-30 MED ORDER — CALCIUM-VITAMIN D-VITAMIN K 500-500-40 MG-UNT-MCG PO CHEW: CHEWABLE_TABLET | ORAL | Status: DC

## 2011-06-30 NOTE — Assessment & Plan Note (Signed)
Check labs con't meds 

## 2011-06-30 NOTE — Addendum Note (Signed)
Addended by: Silvio Pate D on: 06/30/2011 10:42 AM   Modules accepted: Orders

## 2011-06-30 NOTE — Progress Notes (Signed)
  Subjective:     Rachel Kline is a 56 y.o. female and is here for a comprehensive physical exam. The patient reports no problems.  History   Social History  . Marital Status: Married    Spouse Name: N/A    Number of Children: N/A  . 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 Smoking status: Never Smoker   . Smokeless tobacco: Not on file  . Alcohol Use: Yes     rare  . Drug Use: No  . Sexually Active: Yes -- Female partner(s)   Other Topics Concern  . Not on file   Social History Narrative   Exercise-- no   Health Maintenance  Topic Date Due  . Influenza Vaccine  10/17/2011  . Mammogram  06/15/2013  . Pap Smear  06/16/2014  . Tetanus/tdap  02/22/2016  . Colonoscopy  04/01/2019    The following portions of the patient's history were reviewed and updated as appropriate: allergies, current medications, past family history, past medical history, past social history, past surgical history and problem list.  Review of Systems Review of Systems  Constitutional: Negative for activity change, appetite change and fatigue.  HENT: Negative for hearing loss, congestion, tinnitus and ear discharge.  dentist q52m Eyes: Negative for visual disturbance (see optho q1y -- vision corrected to 20/20 with glasses).  Respiratory: Negative for cough, chest tightness and shortness of breath.   Cardiovascular: Negative for chest pain, palpitations and leg swelling.  Gastrointestinal: Negative for abdominal pain, diarrhea, constipation and abdominal distention.  Genitourinary: Negative for urgency, frequency, decreased urine volume and difficulty urinating.  Musculoskeletal: Negative for back pain, arthralgias and gait problem.  Skin: Negative for color change, pallor and rash.  Neurological: Negative for dizziness, light-headedness, numbness and headaches.  Hematological: Negative for  adenopathy. Does not bruise/bleed easily.  Psychiatric/Behavioral: Negative for suicidal ideas, confusion, sleep disturbance, self-injury, dysphoric mood, decreased concentration and agitation.       Objective:    BP 118/66  Pulse 77  Temp 98.3 F (36.8 C) (Oral)  Ht 5\' 4"  (1.626 m)  Wt 155 lb (70.308 kg)  BMI 26.61 kg/m2  SpO2 96% General appearance: alert, cooperative, appears stated age and no distress Head: Normocephalic, without obvious abnormality, atraumatic Eyes: conjunctivae/corneas clear. PERRL, EOM's intact. Fundi benign. Ears: normal TM's and external ear canals both ears Nose: Nares normal. Septum midline. Mucosa normal. No drainage or sinus tenderness. Throat: lips, mucosa, and tongue normal; teeth and gums normal Neck: no adenopathy, no carotid bruit, no JVD, supple, symmetrical, trachea midline and thyroid not enlarged, symmetric, no tenderness/mass/nodules Back: symmetric, no curvature. ROM normal. No CVA tenderness. Lungs: clear to auscultation bilaterally Breasts: gyn Heart: regular rate and rhythm, S1, S2 normal, no murmur, click, rub or gallop Abdomen: soft, non-tender; bowel sounds normal; no masses,  no organomegaly Pelvic: gyn Extremities: extremities normal, atraumatic, no cyanosis or edema Pulses: 2+ and symmetric Skin: Skin color, texture, turgor normal. No rashes or lesions Lymph nodes: Cervical, supraclavicular, and axillary nodes normal. Neurologic: Alert and oriented X 3, normal strength and tone. Normal symmetric reflexes. Normal coordination and gait psych-- no depression, no anxiety    Assessment:    Healthy female exam.      Plan:  ghm utd Check labs   See After Visit Summary for Counseling Recommendations

## 2011-06-30 NOTE — Patient Instructions (Addendum)
Preventive Care for Adults, Female A healthy lifestyle and preventive care can promote health and wellness. Preventive health guidelines for women include the following key practices.  A routine yearly physical is a good way to check with your caregiver about your health and preventive screening. It is a chance to share any concerns and updates on your health, and to receive a thorough exam.   Visit your dentist for a routine exam and preventive care every 6 months. Brush your teeth twice a day and floss once a day. Good oral hygiene prevents tooth decay and gum disease.   The frequency of eye exams is based on your age, health, family medical history, use of contact lenses, and other factors. Follow your caregiver's recommendations for frequency of eye exams.   Eat a healthy diet. Foods like vegetables, fruits, whole grains, low-fat dairy products, and lean protein foods contain the nutrients you need without too many calories. Decrease your intake of foods high in solid fats, added sugars, and salt. Eat the right amount of calories for you.Get information about a proper diet from your caregiver, if necessary.   Regular physical exercise is one of the most important things you can do for your health. Most adults should get at least 150 minutes of moderate-intensity exercise (any activity that increases your heart rate and causes you to sweat) each week. In addition, most adults need muscle-strengthening exercises on 2 or more days a week.   Maintain a healthy weight. The body mass index (BMI) is a screening tool to identify possible weight problems. It provides an estimate of body fat based on height and weight. Your caregiver can help determine your BMI, and can help you achieve or maintain a healthy weight.For adults 20 years and older:   A BMI below 18.5 is considered underweight.   A BMI of 18.5 to 24.9 is normal.   A BMI of 25 to 29.9 is considered overweight.   A BMI of 30 and above is  considered obese.   Maintain normal blood lipids and cholesterol levels by exercising and minimizing your intake of saturated fat. Eat a balanced diet with plenty of fruit and vegetables. Blood tests for lipids and cholesterol should begin at age 20 and be repeated every 5 years. If your lipid or cholesterol levels are high, you are over 50, or you are at high risk for heart disease, you may need your cholesterol levels checked more frequently.Ongoing high lipid and cholesterol levels should be treated with medicines if diet and exercise are not effective.   If you smoke, find out from your caregiver how to quit. If you do not use tobacco, do not start.   If you are pregnant, do not drink alcohol. If you are breastfeeding, be very cautious about drinking alcohol. If you are not pregnant and choose to drink alcohol, do not exceed 1 drink per day. One drink is considered to be 12 ounces (355 mL) of beer, 5 ounces (148 mL) of wine, or 1.5 ounces (44 mL) of liquor.   Avoid use of street drugs. Do not share needles with anyone. Ask for help if you need support or instructions about stopping the use of drugs.   High blood pressure causes heart disease and increases the risk of stroke. Your blood pressure should be checked at least every 1 to 2 years. Ongoing high blood pressure should be treated with medicines if weight loss and exercise are not effective.   If you are 55 to 58   years old, ask your caregiver if you should take aspirin to prevent strokes.   Diabetes screening involves taking a blood sample to check your fasting blood sugar level. This should be done once every 3 years, after age 45, if you are within normal weight and without risk factors for diabetes. Testing should be considered at a younger age or be carried out more frequently if you are overweight and have at least 1 risk factor for diabetes.   Breast cancer screening is essential preventive care for women. You should practice "breast  self-awareness." This means understanding the normal appearance and feel of your breasts and may include breast self-examination. Any changes detected, no matter how small, should be reported to a caregiver. Women in their 20s and 30s should have a clinical breast exam (CBE) by a caregiver as part of a regular health exam every 1 to 3 years. After age 40, women should have a CBE every year. Starting at age 40, women should consider having a mammography (breast X-ray test) every year. Women who have a family history of breast cancer should talk to their caregiver about genetic screening. Women at a high risk of breast cancer should talk to their caregivers about having magnetic resonance imaging (MRI) and a mammography every year.   The Pap test is a screening test for cervical cancer. A Pap test can show cell changes on the cervix that might become cervical cancer if left untreated. A Pap test is a procedure in which cells are obtained and examined from the lower end of the uterus (cervix).   Women should have a Pap test starting at age 21.   Between ages 21 and 29, Pap tests should be repeated every 2 years.   Beginning at age 30, you should have a Pap test every 3 years as long as the past 3 Pap tests have been normal.   Some women have medical problems that increase the chance of getting cervical cancer. Talk to your caregiver about these problems. It is especially important to talk to your caregiver if a new problem develops soon after your last Pap test. In these cases, your caregiver may recommend more frequent screening and Pap tests.   The above recommendations are the same for women who have or have not gotten the vaccine for human papillomavirus (HPV).   If you had a hysterectomy for a problem that was not cancer or a condition that could lead to cancer, then you no longer need Pap tests. Even if you no longer need a Pap test, a regular exam is a good idea to make sure no other problems are  starting.   If you are between ages 65 and 70, and you have had normal Pap tests going back 10 years, you no longer need Pap tests. Even if you no longer need a Pap test, a regular exam is a good idea to make sure no other problems are starting.   If you have had past treatment for cervical cancer or a condition that could lead to cancer, you need Pap tests and screening for cancer for at least 20 years after your treatment.   If Pap tests have been discontinued, risk factors (such as a new sexual partner) need to be reassessed to determine if screening should be resumed.   The HPV test is an additional test that may be used for cervical cancer screening. The HPV test looks for the virus that can cause the cell changes on the cervix.   The cells collected during the Pap test can be tested for HPV. The HPV test could be used to screen women aged 30 years and older, and should be used in women of any age who have unclear Pap test results. After the age of 30, women should have HPV testing at the same frequency as a Pap test.   Colorectal cancer can be detected and often prevented. Most routine colorectal cancer screening begins at the age of 50 and continues through age 75. However, your caregiver may recommend screening at an earlier age if you have risk factors for colon cancer. On a yearly basis, your caregiver may provide home test kits to check for hidden blood in the stool. Use of a small camera at the end of a tube, to directly examine the colon (sigmoidoscopy or colonoscopy), can detect the earliest forms of colorectal cancer. Talk to your caregiver about this at age 50, when routine screening begins. Direct examination of the colon should be repeated every 5 to 10 years through age 75, unless early forms of pre-cancerous polyps or small growths are found.   Hepatitis C blood testing is recommended for all people born from 1945 through 1965 and any individual with known risks for hepatitis C.    Practice safe sex. Use condoms and avoid high-risk sexual practices to reduce the spread of sexually transmitted infections (STIs). STIs include gonorrhea, chlamydia, syphilis, trichomonas, herpes, HPV, and human immunodeficiency virus (HIV). Herpes, HIV, and HPV are viral illnesses that have no cure. They can result in disability, cancer, and death. Sexually active women aged 25 and younger should be checked for chlamydia. Older women with new or multiple partners should also be tested for chlamydia. Testing for other STIs is recommended if you are sexually active and at increased risk.   Osteoporosis is a disease in which the bones lose minerals and strength with aging. This can result in serious bone fractures. The risk of osteoporosis can be identified using a bone density scan. Women ages 65 and over and women at risk for fractures or osteoporosis should discuss screening with their caregivers. Ask your caregiver whether you should take a calcium supplement or vitamin D to reduce the rate of osteoporosis.   Menopause can be associated with physical symptoms and risks. Hormone replacement therapy is available to decrease symptoms and risks. You should talk to your caregiver about whether hormone replacement therapy is right for you.   Use sunscreen with sun protection factor (SPF) of 30 or more. Apply sunscreen liberally and repeatedly throughout the day. You should seek shade when your shadow is shorter than you. Protect yourself by wearing long sleeves, pants, a wide-brimmed hat, and sunglasses year round, whenever you are outdoors.   Once a month, do a whole body skin exam, using a mirror to look at the skin on your back. Notify your caregiver of new moles, moles that have irregular borders, moles that are larger than a pencil eraser, or moles that have changed in shape or color.   Stay current with required immunizations.   Influenza. You need a dose every fall (or winter). The composition of  the flu vaccine changes each year, so being vaccinated once is not enough.   Pneumococcal polysaccharide. You need 1 to 2 doses if you smoke cigarettes or if you have certain chronic medical conditions. You need 1 dose at age 65 (or older) if you have never been vaccinated.   Tetanus, diphtheria, pertussis (Tdap, Td). Get 1 dose of   Tdap vaccine if you are younger than age 65, are over 65 and have contact with an infant, are a healthcare worker, are pregnant, or simply want to be protected from whooping cough. After that, you need a Td booster dose every 10 years. Consult your caregiver if you have not had at least 3 tetanus and diphtheria-containing shots sometime in your life or have a deep or dirty wound.   HPV. You need this vaccine if you are a woman age 26 or younger. The vaccine is given in 3 doses over 6 months.   Measles, mumps, rubella (MMR). You need at least 1 dose of MMR if you were born in 1957 or later. You may also need a second dose.   Meningococcal. If you are age 19 to 21 and a first-year college student living in a residence hall, or have one of several medical conditions, you need to get vaccinated against meningococcal disease. You may also need additional booster doses.   Zoster (shingles). If you are age 60 or older, you should get this vaccine.   Varicella (chickenpox). If you have never had chickenpox or you were vaccinated but received only 1 dose, talk to your caregiver to find out if you need this vaccine.   Hepatitis A. You need this vaccine if you have a specific risk factor for hepatitis A virus infection or you simply wish to be protected from this disease. The vaccine is usually given as 2 doses, 6 to 18 months apart.   Hepatitis B. You need this vaccine if you have a specific risk factor for hepatitis B virus infection or you simply wish to be protected from this disease. The vaccine is given in 3 doses, usually over 6 months.  Preventive Services /  Frequency Ages 19 to 39  Blood pressure check.** / Every 1 to 2 years.   Lipid and cholesterol check.** / Every 5 years beginning at age 20.   Clinical breast exam.** / Every 3 years for women in their 20s and 30s.   Pap test.** / Every 2 years from ages 21 through 29. Every 3 years starting at age 30 through age 65 or 70 with a history of 3 consecutive normal Pap tests.   HPV screening.** / Every 3 years from ages 30 through ages 65 to 70 with a history of 3 consecutive normal Pap tests.   Hepatitis C blood test.** / For any individual with known risks for hepatitis C.   Skin self-exam. / Monthly.   Influenza immunization.** / Every year.   Pneumococcal polysaccharide immunization.** / 1 to 2 doses if you smoke cigarettes or if you have certain chronic medical conditions.   Tetanus, diphtheria, pertussis (Tdap, Td) immunization. / A one-time dose of Tdap vaccine. After that, you need a Td booster dose every 10 years.   HPV immunization. / 3 doses over 6 months, if you are 26 and younger.   Measles, mumps, rubella (MMR) immunization. / You need at least 1 dose of MMR if you were born in 1957 or later. You may also need a second dose.   Meningococcal immunization. / 1 dose if you are age 19 to 21 and a first-year college student living in a residence hall, or have one of several medical conditions, you need to get vaccinated against meningococcal disease. You may also need additional booster doses.   Varicella immunization.** / Consult your caregiver.   Hepatitis A immunization.** / Consult your caregiver. 2 doses, 6 to 18 months   apart.   Hepatitis B immunization.** / Consult your caregiver. 3 doses usually over 6 months.  Ages 40 to 64  Blood pressure check.** / Every 1 to 2 years.   Lipid and cholesterol check.** / Every 5 years beginning at age 20.   Clinical breast exam.** / Every year after age 40.   Mammogram.** / Every year beginning at age 40 and continuing for as  long as you are in good health. Consult with your caregiver.   Pap test.** / Every 3 years starting at age 30 through age 65 or 70 with a history of 3 consecutive normal Pap tests.   HPV screening.** / Every 3 years from ages 30 through ages 65 to 70 with a history of 3 consecutive normal Pap tests.   Fecal occult blood test (FOBT) of stool. / Every year beginning at age 50 and continuing until age 75. You may not need to do this test if you get a colonoscopy every 10 years.   Flexible sigmoidoscopy or colonoscopy.** / Every 5 years for a flexible sigmoidoscopy or every 10 years for a colonoscopy beginning at age 50 and continuing until age 75.   Hepatitis C blood test.** / For all people born from 1945 through 1965 and any individual with known risks for hepatitis C.   Skin self-exam. / Monthly.   Influenza immunization.** / Every year.   Pneumococcal polysaccharide immunization.** / 1 to 2 doses if you smoke cigarettes or if you have certain chronic medical conditions.   Tetanus, diphtheria, pertussis (Tdap, Td) immunization.** / A one-time dose of Tdap vaccine. After that, you need a Td booster dose every 10 years.   Measles, mumps, rubella (MMR) immunization. / You need at least 1 dose of MMR if you were born in 1957 or later. You may also need a second dose.   Varicella immunization.** / Consult your caregiver.   Meningococcal immunization.** / Consult your caregiver.   Hepatitis A immunization.** / Consult your caregiver. 2 doses, 6 to 18 months apart.   Hepatitis B immunization.** / Consult your caregiver. 3 doses, usually over 6 months.  Ages 65 and over  Blood pressure check.** / Every 1 to 2 years.   Lipid and cholesterol check.** / Every 5 years beginning at age 20.   Clinical breast exam.** / Every year after age 40.   Mammogram.** / Every year beginning at age 40 and continuing for as long as you are in good health. Consult with your caregiver.   Pap test.** /  Every 3 years starting at age 30 through age 65 or 70 with a 3 consecutive normal Pap tests. Testing can be stopped between 65 and 70 with 3 consecutive normal Pap tests and no abnormal Pap or HPV tests in the past 10 years.   HPV screening.** / Every 3 years from ages 30 through ages 65 or 70 with a history of 3 consecutive normal Pap tests. Testing can be stopped between 65 and 70 with 3 consecutive normal Pap tests and no abnormal Pap or HPV tests in the past 10 years.   Fecal occult blood test (FOBT) of stool. / Every year beginning at age 50 and continuing until age 75. You may not need to do this test if you get a colonoscopy every 10 years.   Flexible sigmoidoscopy or colonoscopy.** / Every 5 years for a flexible sigmoidoscopy or every 10 years for a colonoscopy beginning at age 50 and continuing until age 75.   Hepatitis   C blood test.** / For all people born from 1945 through 1965 and any individual with known risks for hepatitis C.   Osteoporosis screening.** / A one-time screening for women ages 65 and over and women at risk for fractures or osteoporosis.   Skin self-exam. / Monthly.   Influenza immunization.** / Every year.   Pneumococcal polysaccharide immunization.** / 1 dose at age 65 (or older) if you have never been vaccinated.   Tetanus, diphtheria, pertussis (Tdap, Td) immunization. / A one-time dose of Tdap vaccine if you are over 65 and have contact with an infant, are a healthcare worker, or simply want to be protected from whooping cough. After that, you need a Td booster dose every 10 years.   Varicella immunization.** / Consult your caregiver.   Meningococcal immunization.** / Consult your caregiver.   Hepatitis A immunization.** / Consult your caregiver. 2 doses, 6 to 18 months apart.   Hepatitis B immunization.** / Check with your caregiver. 3 doses, usually over 6 months.  ** Family history and personal history of risk and conditions may change your caregiver's  recommendations. Document Released: 02/28/2001 Document Revised: 12/22/2010 Document Reviewed: 05/30/2010 [employer] Patient Information 2012 [employer]. 

## 2011-07-17 ENCOUNTER — Other Ambulatory Visit: Payer: Self-pay | Admitting: Family Medicine

## 2012-01-01 ENCOUNTER — Ambulatory Visit: Payer: 59

## 2012-01-01 DIAGNOSIS — E785 Hyperlipidemia, unspecified: Secondary | ICD-10-CM

## 2012-01-01 LAB — LIPID PANEL
Cholesterol: 149 mg/dL (ref 0–200)
VLDL: 13.2 mg/dL (ref 0.0–40.0)

## 2012-01-01 LAB — HEPATIC FUNCTION PANEL
ALT: 20 U/L (ref 0–35)
AST: 26 U/L (ref 0–37)
Albumin: 4.4 g/dL (ref 3.5–5.2)
Alkaline Phosphatase: 52 U/L (ref 39–117)
Total Protein: 7.2 g/dL (ref 6.0–8.3)

## 2012-01-12 ENCOUNTER — Other Ambulatory Visit: Payer: Self-pay | Admitting: Family Medicine

## 2012-01-12 DIAGNOSIS — E7800 Pure hypercholesterolemia, unspecified: Secondary | ICD-10-CM

## 2012-01-12 NOTE — Telephone Encounter (Signed)
Refill for crestor sent to [facility]

## 2012-03-02 ENCOUNTER — Other Ambulatory Visit: Payer: Self-pay

## 2012-03-05 ENCOUNTER — Encounter: Payer: Self-pay | Admitting: Family Medicine

## 2012-06-24 ENCOUNTER — Other Ambulatory Visit: Payer: Self-pay | Admitting: Obstetrics and Gynecology

## 2012-07-02 DIAGNOSIS — H3530 Unspecified macular degeneration: Secondary | ICD-10-CM

## 2012-07-02 HISTORY — DX: Unspecified macular degeneration: H35.30

## 2012-07-15 ENCOUNTER — Encounter: Payer: Self-pay | Admitting: Family Medicine

## 2012-07-15 ENCOUNTER — Ambulatory Visit (INDEPENDENT_AMBULATORY_CARE_PROVIDER_SITE_OTHER): Payer: 59 | Admitting: Family Medicine

## 2012-07-15 VITALS — BP 120/68 | HR 66 | Temp 98.5°F | Ht 64.5 in | Wt 164.2 lb

## 2012-07-15 DIAGNOSIS — E785 Hyperlipidemia, unspecified: Secondary | ICD-10-CM

## 2012-07-15 DIAGNOSIS — Z0000 Encounter for general adult medical examination without abnormal findings: Secondary | ICD-10-CM

## 2012-07-15 DIAGNOSIS — H3530 Unspecified macular degeneration: Secondary | ICD-10-CM

## 2012-07-15 LAB — CBC WITH DIFFERENTIAL/PLATELET
Basophils Absolute: 0 10*3/uL (ref 0.0–0.1)
Eosinophils Relative: 3 % (ref 0.0–5.0)
HCT: 43.8 % (ref 36.0–46.0)
Hemoglobin: 14.6 g/dL (ref 12.0–15.0)
Lymphs Abs: 1.4 10*3/uL (ref 0.7–4.0)
MCV: 95.8 fl (ref 78.0–100.0)
Monocytes Absolute: 0.4 10*3/uL (ref 0.1–1.0)
Monocytes Relative: 6.9 % (ref 3.0–12.0)
Neutro Abs: 3.6 10*3/uL (ref 1.4–7.7)
Platelets: 238 10*3/uL (ref 150.0–400.0)
RDW: 13.1 % (ref 11.5–14.6)

## 2012-07-15 LAB — POCT URINALYSIS DIPSTICK
Blood, UA: NEGATIVE
Glucose, UA: NEGATIVE
Ketones, UA: NEGATIVE
Spec Grav, UA: <=1.005

## 2012-07-15 LAB — TSH: TSH: 1.51 u[IU]/mL (ref 0.35–5.50)

## 2012-07-15 LAB — BASIC METABOLIC PANEL
BUN: 11 mg/dL (ref 6–23)
Chloride: 102 mEq/L (ref 96–112)
GFR: 112.98 mL/min (ref >60.00)
Glucose, Bld: 99 mg/dL (ref 70–99)
Potassium: 4 mEq/L (ref 3.5–5.1)
Sodium: 137 mEq/L (ref 135–145)

## 2012-07-15 LAB — LIPID PANEL
Cholesterol: 154 mg/dL (ref 0–200)
Triglycerides: 101 mg/dL (ref 0.0–149.0)

## 2012-07-15 LAB — MICROALBUMIN / CREATININE URINE RATIO
Creatinine,U: 67 mg/dL
Microalb Creat Ratio: 0.3 mg/g (ref 0.0–30.0)
Microalb, Ur: 0.2 mg/dL (ref 0.0–1.9)

## 2012-07-15 LAB — HEPATIC FUNCTION PANEL
ALT: 32 U/L (ref 0–35)
AST: 31 U/L (ref 0–37)
Albumin: 4.5 g/dL (ref 3.5–5.2)

## 2012-07-15 MED ORDER — ATORVASTATIN CALCIUM 20 MG PO TABS: 20.0000 mg | ORAL_TABLET | Freq: Every day | ORAL | Status: DC

## 2012-07-15 NOTE — Patient Instructions (Signed)
Preventive Care for Adults, Female A healthy lifestyle and preventive care can promote health and wellness. Preventive health guidelines for women include the following key practices.  A routine yearly physical is a good way to check with your caregiver about your health and preventive screening. It is a chance to share any concerns and updates on your health, and to receive a thorough exam.  Visit your dentist for a routine exam and preventive care every 6 months. Brush your teeth twice a day and floss once a day. Good oral hygiene prevents tooth decay and gum disease.  The frequency of eye exams is based on your age, health, family medical history, use of contact lenses, and other factors. Follow your caregiver's recommendations for frequency of eye exams.  Eat a healthy diet. Foods like vegetables, fruits, whole grains, low-fat dairy products, and lean protein foods contain the nutrients you need without too many calories. Decrease your intake of foods high in solid fats, added sugars, and salt. Eat the right amount of calories for you.Get information about a proper diet from your caregiver, if necessary.  Regular physical exercise is one of the most important things you can do for your health. Most adults should get at least 150 minutes of moderate-intensity exercise (any activity that increases your heart rate and causes you to sweat) each week. In addition, most adults need muscle-strengthening exercises on 2 or more days a week.  Maintain a healthy weight. The body mass index (BMI) is a screening tool to identify possible weight problems. It provides an estimate of body fat based on height and weight. Your caregiver can help determine your BMI, and can help you achieve or maintain a healthy weight.For adults 20 years and older:  A BMI below 18.5 is considered underweight.  A BMI of 18.5 to 24.9 is normal.  A BMI of 25 to 29.9 is considered overweight.  A BMI of 30 and above is  considered obese.  Maintain normal blood lipids and cholesterol levels by exercising and minimizing your intake of saturated fat. Eat a balanced diet with plenty of fruit and vegetables. Blood tests for lipids and cholesterol should begin at age 20 and be repeated every 5 years. If your lipid or cholesterol levels are high, you are over 50, or you are at high risk for heart disease, you may need your cholesterol levels checked more frequently.Ongoing high lipid and cholesterol levels should be treated with medicines if diet and exercise are not effective.  If you smoke, find out from your caregiver how to quit. If you do not use tobacco, do not start.  If you are pregnant, do not drink alcohol. If you are breastfeeding, be very cautious about drinking alcohol. If you are not pregnant and choose to drink alcohol, do not exceed 1 drink per day. One drink is considered to be 12 ounces (355 mL) of beer, 5 ounces (148 mL) of wine, or 1.5 ounces (44 mL) of liquor.  Avoid use of street drugs. Do not share needles with anyone. Ask for help if you need support or instructions about stopping the use of drugs.  High blood pressure causes heart disease and increases the risk of stroke. Your blood pressure should be checked at least every 1 to 2 years. Ongoing high blood pressure should be treated with medicines if weight loss and exercise are not effective.  If you are 55 to 59 years old, ask your caregiver if you should take aspirin to prevent strokes.  Diabetes   screening involves taking a blood sample to check your fasting blood sugar level. This should be done once every 3 years, after age 45, if you are within normal weight and without risk factors for diabetes. Testing should be considered at a younger age or be carried out more frequently if you are overweight and have at least 1 risk factor for diabetes.  Breast cancer screening is essential preventive care for women. You should practice "breast  self-awareness." This means understanding the normal appearance and feel of your breasts and may include breast self-examination. Any changes detected, no matter how small, should be reported to a caregiver. Women in their 20s and 30s should have a clinical breast exam (CBE) by a caregiver as part of a regular health exam every 1 to 3 years. After age 40, women should have a CBE every year. Starting at age 40, women should consider having a mammography (breast X-ray test) every year. Women who have a family history of breast cancer should talk to their caregiver about genetic screening. Women at a high risk of breast cancer should talk to their caregivers about having magnetic resonance imaging (MRI) and a mammography every year.  The Pap test is a screening test for cervical cancer. A Pap test can show cell changes on the cervix that might become cervical cancer if left untreated. A Pap test is a procedure in which cells are obtained and examined from the lower end of the uterus (cervix).  Women should have a Pap test starting at age 21.  Between ages 21 and 29, Pap tests should be repeated every 2 years.  Beginning at age 30, you should have a Pap test every 3 years as long as the past 3 Pap tests have been normal.  Some women have medical problems that increase the chance of getting cervical cancer. Talk to your caregiver about these problems. It is especially important to talk to your caregiver if a new problem develops soon after your last Pap test. In these cases, your caregiver may recommend more frequent screening and Pap tests.  The above recommendations are the same for women who have or have not gotten the vaccine for human papillomavirus (HPV).  If you had a hysterectomy for a problem that was not cancer or a condition that could lead to cancer, then you no longer need Pap tests. Even if you no longer need a Pap test, a regular exam is a good idea to make sure no other problems are  starting.  If you are between ages 65 and 70, and you have had normal Pap tests going back 10 years, you no longer need Pap tests. Even if you no longer need a Pap test, a regular exam is a good idea to make sure no other problems are starting.  If you have had past treatment for cervical cancer or a condition that could lead to cancer, you need Pap tests and screening for cancer for at least 20 years after your treatment.  If Pap tests have been discontinued, risk factors (such as a new sexual partner) need to be reassessed to determine if screening should be resumed.  The HPV test is an additional test that may be used for cervical cancer screening. The HPV test looks for the virus that can cause the cell changes on the cervix. The cells collected during the Pap test can be tested for HPV. The HPV test could be used to screen women aged 30 years and older, and should   be used in women of any age who have unclear Pap test results. After the age of 30, women should have HPV testing at the same frequency as a Pap test.  Colorectal cancer can be detected and often prevented. Most routine colorectal cancer screening begins at the age of 50 and continues through age 75. However, your caregiver may recommend screening at an earlier age if you have risk factors for colon cancer. On a yearly basis, your caregiver may provide home test kits to check for hidden blood in the stool. Use of a small camera at the end of a tube, to directly examine the colon (sigmoidoscopy or colonoscopy), can detect the earliest forms of colorectal cancer. Talk to your caregiver about this at age 50, when routine screening begins. Direct examination of the colon should be repeated every 5 to 10 years through age 75, unless early forms of pre-cancerous polyps or small growths are found.  Hepatitis C blood testing is recommended for all people born from 1945 through 1965 and any individual with known risks for hepatitis C.  Practice  safe sex. Use condoms and avoid high-risk sexual practices to reduce the spread of sexually transmitted infections (STIs). STIs include gonorrhea, chlamydia, syphilis, trichomonas, herpes, HPV, and human immunodeficiency virus (HIV). Herpes, HIV, and HPV are viral illnesses that have no cure. They can result in disability, cancer, and death. Sexually active women aged 25 and younger should be checked for chlamydia. Older women with new or multiple partners should also be tested for chlamydia. Testing for other STIs is recommended if you are sexually active and at increased risk.  Osteoporosis is a disease in which the bones lose minerals and strength with aging. This can result in serious bone fractures. The risk of osteoporosis can be identified using a bone density scan. Women ages 65 and over and women at risk for fractures or osteoporosis should discuss screening with their caregivers. Ask your caregiver whether you should take a calcium supplement or vitamin D to reduce the rate of osteoporosis.  Menopause can be associated with physical symptoms and risks. Hormone replacement therapy is available to decrease symptoms and risks. You should talk to your caregiver about whether hormone replacement therapy is right for you.  Use sunscreen with sun protection factor (SPF) of 30 or more. Apply sunscreen liberally and repeatedly throughout the day. You should seek shade when your shadow is shorter than you. Protect yourself by wearing long sleeves, pants, a wide-brimmed hat, and sunglasses year round, whenever you are outdoors.  Once a month, do a whole body skin exam, using a mirror to look at the skin on your back. Notify your caregiver of new moles, moles that have irregular borders, moles that are larger than a pencil eraser, or moles that have changed in shape or color.  Stay current with required immunizations.  Influenza. You need a dose every fall (or winter). The composition of the flu vaccine  changes each year, so being vaccinated once is not enough.  Pneumococcal polysaccharide. You need 1 to 2 doses if you smoke cigarettes or if you have certain chronic medical conditions. You need 1 dose at age 65 (or older) if you have never been vaccinated.  Tetanus, diphtheria, pertussis (Tdap, Td). Get 1 dose of Tdap vaccine if you are younger than age 65, are over 65 and have contact with an infant, are a healthcare worker, are pregnant, or simply want to be protected from whooping cough. After that, you need a Td   booster dose every 10 years. Consult your caregiver if you have not had at least 3 tetanus and diphtheria-containing shots sometime in your life or have a deep or dirty wound.  HPV. You need this vaccine if you are a woman age 26 or younger. The vaccine is given in 3 doses over 6 months.  Measles, mumps, rubella (MMR). You need at least 1 dose of MMR if you were born in 1957 or later. You may also need a second dose.  Meningococcal. If you are age 19 to 21 and a first-year college student living in a residence hall, or have one of several medical conditions, you need to get vaccinated against meningococcal disease. You may also need additional booster doses.  Zoster (shingles). If you are age 60 or older, you should get this vaccine.  Varicella (chickenpox). If you have never had chickenpox or you were vaccinated but received only 1 dose, talk to your caregiver to find out if you need this vaccine.  Hepatitis A. You need this vaccine if you have a specific risk factor for hepatitis A virus infection or you simply wish to be protected from this disease. The vaccine is usually given as 2 doses, 6 to 18 months apart.  Hepatitis B. You need this vaccine if you have a specific risk factor for hepatitis B virus infection or you simply wish to be protected from this disease. The vaccine is given in 3 doses, usually over 6 months. Preventive Services / Frequency Ages 19 to 39  Blood  pressure check.** / Every 1 to 2 years.  Lipid and cholesterol check.** / Every 5 years beginning at age 20.  Clinical breast exam.** / Every 3 years for women in their 20s and 30s.  Pap test.** / Every 2 years from ages 21 through 29. Every 3 years starting at age 30 through age 65 or 70 with a history of 3 consecutive normal Pap tests.  HPV screening.** / Every 3 years from ages 30 through ages 65 to 70 with a history of 3 consecutive normal Pap tests.  Hepatitis C blood test.** / For any individual with known risks for hepatitis C.  Skin self-exam. / Monthly.  Influenza immunization.** / Every year.  Pneumococcal polysaccharide immunization.** / 1 to 2 doses if you smoke cigarettes or if you have certain chronic medical conditions.  Tetanus, diphtheria, pertussis (Tdap, Td) immunization. / A one-time dose of Tdap vaccine. After that, you need a Td booster dose every 10 years.  HPV immunization. / 3 doses over 6 months, if you are 26 and younger.  Measles, mumps, rubella (MMR) immunization. / You need at least 1 dose of MMR if you were born in 1957 or later. You may also need a second dose.  Meningococcal immunization. / 1 dose if you are age 19 to 21 and a first-year college student living in a residence hall, or have one of several medical conditions, you need to get vaccinated against meningococcal disease. You may also need additional booster doses.  Varicella immunization.** / Consult your caregiver.  Hepatitis A immunization.** / Consult your caregiver. 2 doses, 6 to 18 months apart.  Hepatitis B immunization.** / Consult your caregiver. 3 doses usually over 6 months. Ages 40 to 64  Blood pressure check.** / Every 1 to 2 years.  Lipid and cholesterol check.** / Every 5 years beginning at age 20.  Clinical breast exam.** / Every year after age 40.  Mammogram.** / Every year beginning at age 40   and continuing for as long as you are in good health. Consult with your  caregiver.  Pap test.** / Every 3 years starting at age 30 through age 65 or 70 with a history of 3 consecutive normal Pap tests.  HPV screening.** / Every 3 years from ages 30 through ages 65 to 70 with a history of 3 consecutive normal Pap tests.  Fecal occult blood test (FOBT) of stool. / Every year beginning at age 50 and continuing until age 75. You may not need to do this test if you get a colonoscopy every 10 years.  Flexible sigmoidoscopy or colonoscopy.** / Every 5 years for a flexible sigmoidoscopy or every 10 years for a colonoscopy beginning at age 50 and continuing until age 75.  Hepatitis C blood test.** / For all people born from 1945 through 1965 and any individual with known risks for hepatitis C.  Skin self-exam. / Monthly.  Influenza immunization.** / Every year.  Pneumococcal polysaccharide immunization.** / 1 to 2 doses if you smoke cigarettes or if you have certain chronic medical conditions.  Tetanus, diphtheria, pertussis (Tdap, Td) immunization.** / A one-time dose of Tdap vaccine. After that, you need a Td booster dose every 10 years.  Measles, mumps, rubella (MMR) immunization. / You need at least 1 dose of MMR if you were born in 1957 or later. You may also need a second dose.  Varicella immunization.** / Consult your caregiver.  Meningococcal immunization.** / Consult your caregiver.  Hepatitis A immunization.** / Consult your caregiver. 2 doses, 6 to 18 months apart.  Hepatitis B immunization.** / Consult your caregiver. 3 doses, usually over 6 months. Ages 65 and over  Blood pressure check.** / Every 1 to 2 years.  Lipid and cholesterol check.** / Every 5 years beginning at age 20.  Clinical breast exam.** / Every year after age 40.  Mammogram.** / Every year beginning at age 40 and continuing for as long as you are in good health. Consult with your caregiver.  Pap test.** / Every 3 years starting at age 30 through age 65 or 70 with a 3  consecutive normal Pap tests. Testing can be stopped between 65 and 70 with 3 consecutive normal Pap tests and no abnormal Pap or HPV tests in the past 10 years.  HPV screening.** / Every 3 years from ages 30 through ages 65 or 70 with a history of 3 consecutive normal Pap tests. Testing can be stopped between 65 and 70 with 3 consecutive normal Pap tests and no abnormal Pap or HPV tests in the past 10 years.  Fecal occult blood test (FOBT) of stool. / Every year beginning at age 50 and continuing until age 75. You may not need to do this test if you get a colonoscopy every 10 years.  Flexible sigmoidoscopy or colonoscopy.** / Every 5 years for a flexible sigmoidoscopy or every 10 years for a colonoscopy beginning at age 50 and continuing until age 75.  Hepatitis C blood test.** / For all people born from 1945 through 1965 and any individual with known risks for hepatitis C.  Osteoporosis screening.** / A one-time screening for women ages 65 and over and women at risk for fractures or osteoporosis.  Skin self-exam. / Monthly.  Influenza immunization.** / Every year.  Pneumococcal polysaccharide immunization.** / 1 dose at age 65 (or older) if you have never been vaccinated.  Tetanus, diphtheria, pertussis (Tdap, Td) immunization. / A one-time dose of Tdap vaccine if you are over   65 and have contact with an infant, are a healthcare worker, or simply want to be protected from whooping cough. After that, you need a Td booster dose every 10 years.  Varicella immunization.** / Consult your caregiver.  Meningococcal immunization.** / Consult your caregiver.  Hepatitis A immunization.** / Consult your caregiver. 2 doses, 6 to 18 months apart.  Hepatitis B immunization.** / Check with your caregiver. 3 doses, usually over 6 months. ** Family history and personal history of risk and conditions may change your caregiver's recommendations. Document Released: 02/28/2001 Document Revised: 03/27/2011  Document Reviewed: 05/30/2010 [employer] Patient Information 2014 [employer].  

## 2012-07-15 NOTE — Progress Notes (Signed)
Subjective:     Rachel Kline is a 57 y.o. female and is here for a comprehensive physical exam. The patient reports no problems.  History   Social History  . Marital Status: Married    Spouse Name: N/A    Number of Children: N/A  . 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 Smoking status: Never Smoker   . Smokeless tobacco: Not on file  . Alcohol Use: Yes     Comment: rare  . Drug Use: No  . Sexually Active: Yes -- Female partner(s)   Other Topics Concern  . Not on file   Social History Narrative   Exercise-- very little   Health Maintenance  Topic Date Due  . Influenza Vaccine  09/16/2012  . Mammogram  06/15/2013  . Pap Smear  06/25/2015  . Tetanus/tdap  02/22/2016  . Colonoscopy  04/01/2019    The following portions of the patient's history were reviewed and updated as appropriate:  She  has a past medical history of Hyperlipidemia; Hypertension; and Macular degeneration (07/02/2012). She  does not have any pertinent problems on file. She  has no past surgical history on file. Her family history includes Coronary artery disease in an unspecified family member; Diabetes in her mother; Heart disease in her father; Heart failure in her father; Hyperlipidemia in her brother; Hypertension in her brother and father; Kidney failure in her father; and Pancreatic cancer in her mother. She  reports that she has never smoked. She does not have any smokeless tobacco history on file. She reports that  drinks alcohol. She reports that she does not use illicit drugs. She has a current medication list which includes the following prescription(s): aspirin, calcium-vitamin d-vitamin k, cetirizine, coq10, multivitamins with iron, omega-3, polyethylene glycol powder, zolpidem, and atorvastatin. Current Outpatient Prescriptions on File Prior to Visit  Medication Sig Dispense Refill   . aspirin 81 MG EC tablet Take 81 mg by mouth daily.       . Calcium-Vitamin D-Vitamin K 500-500-40 MG-UNT-MCG CHEW 1 po tid  60 tablet    . cetirizine (ZYRTEC) 10 MG tablet Take 10 mg by mouth daily.        . Coenzyme Q10 (COQ10) 30 MG CAPS Take by mouth 2 (two) times daily.        . Multiple Vitamins-Iron (MULTIVITAMINS WITH IRON) TABS Take 1 tablet by mouth daily.    0  . Omega-3 350 MG CAPS Take by mouth.        . polyethylene glycol (MIRALAX) powder Take 17 g by mouth as needed.        . zolpidem (AMBIEN) 10 MG tablet Take 5 mg by mouth as needed.        No current facility-administered medications on file prior to visit.   She has No Known Allergies..  Review of Systems Review of Systems  Constitutional: Negative for activity change, appetite change and fatigue.  HENT: Negative for hearing loss, congestion, tinnitus and ear discharge.  dentist q77m Eyes: Negative for visual disturbance (see optho q1y -- vision corrected to 20/20 with glasses).  Respiratory: Negative for cough, chest tightness and shortness of breath.   Cardiovascular: Negative for chest pain, palpitations and leg swelling.  Gastrointestinal: Negative for abdominal pain, diarrhea, constipation and abdominal distention.  Genitourinary: Negative for urgency, frequency, decreased urine volume and difficulty urinating.  Musculoskeletal: Negative for back pain, arthralgias and gait problem.  Skin: Negative for color change, pallor and rash.  Neurological: Negative for dizziness, light-headedness, numbness and headaches.  Hematological: Negative for adenopathy. Does not bruise/bleed easily.  Psychiatric/Behavioral: Negative for suicidal ideas, confusion, sleep disturbance, self-injury, dysphoric mood, decreased concentration and agitation.       Objective:    BP 120/68  Pulse 66  Temp(Src) 98.5 F (36.9 C) (Oral)  Ht 5' 4.5" (1.638 m)  Wt 164 lb 3.2 oz (74.481 kg)  BMI 27.76 kg/m2  SpO2 97% General  appearance: alert, cooperative, appears stated age and no distress Head: Normocephalic, without obvious abnormality, atraumatic Eyes: conjunctivae/corneas clear. PERRL, EOM's intact. Fundi benign. Ears: normal TM's and external ear canals both ears Nose: Nares normal. Septum midline. Mucosa normal. No drainage or sinus tenderness. Throat: lips, mucosa, and tongue normal; teeth and gums normal Neck: no adenopathy, no carotid bruit, no JVD, supple, symmetrical, trachea midline and thyroid not enlarged, symmetric, no tenderness/mass/nodules Back: symmetric, no curvature. ROM normal. No CVA tenderness. Lungs: clear to auscultation bilaterally Breasts: gyn Heart: regular rate and rhythm, S1, S2 normal, no murmur, click, rub or gallop Abdomen: soft, non-tender; bowel sounds normal; no masses,  no organomegaly Pelvic: deferred--gyn Extremities: extremities normal, atraumatic, no cyanosis or edema Pulses: 2+ and symmetric Skin: Skin color, texture, turgor normal. No rashes or lesions Lymph nodes: Cervical, supraclavicular, and axillary nodes normal. Neurologic: Alert and oriented X 3, normal strength and tone. Normal symmetric reflexes. Normal coordination and gait Psych-- no depression, no anxiety      Assessment:    Healthy female exam.      Plan:    ghm utd Check labs See After Visit Summary for Counseling Recommendations

## 2012-07-15 NOTE — Assessment & Plan Note (Signed)
Check labs con't meds 

## 2012-08-19 ENCOUNTER — Other Ambulatory Visit: Payer: Self-pay | Admitting: Gastroenterology

## 2012-10-16 ENCOUNTER — Other Ambulatory Visit (INDEPENDENT_AMBULATORY_CARE_PROVIDER_SITE_OTHER): Payer: 59

## 2012-10-16 DIAGNOSIS — E785 Hyperlipidemia, unspecified: Secondary | ICD-10-CM

## 2012-10-16 LAB — BASIC METABOLIC PANEL
CO2: 28 mEq/L (ref 19–32)
Calcium: 9.3 mg/dL (ref 8.4–10.5)
GFR: 100.76 mL/min (ref >60.00)
Glucose, Bld: 92 mg/dL (ref 70–99)
Potassium: 4.2 mEq/L (ref 3.5–5.1)
Sodium: 137 mEq/L (ref 135–145)

## 2012-10-16 LAB — HEPATIC FUNCTION PANEL
ALT: 25 U/L (ref 0–35)
AST: 27 U/L (ref 0–37)
Albumin: 4.3 g/dL (ref 3.5–5.2)
Total Bilirubin: 0.6 mg/dL (ref 0.3–1.2)

## 2012-10-16 LAB — LIPID PANEL
HDL: 53.1 mg/dL (ref >39.00)
Triglycerides: 89 mg/dL (ref 0.0–149.0)
VLDL: 17.8 mg/dL (ref 0.0–40.0)

## 2012-10-25 ENCOUNTER — Encounter: Payer: Self-pay | Admitting: Family Medicine

## 2012-11-18 ENCOUNTER — Encounter (HOSPITAL_BASED_OUTPATIENT_CLINIC_OR_DEPARTMENT_OTHER): Payer: Self-pay | Admitting: *Deleted

## 2012-11-18 NOTE — Progress Notes (Signed)
No labs needed

## 2012-11-21 NOTE — H&P (Signed)
PREOPERATIVE H&P  Chief Complaint: problems with tonsils  HPI: Rachel Kline is a 57 y.o. female who presents for evaluation of chronic tonsil infections. She frquently has tonsil stones and sore throats. She has large cryptic tonsils and long uvula. She's taken to the OR for tonsillectomy.  Past Medical History  Diagnosis Date  . Hyperlipidemia   . Hypertension   . Macular degeneration 07/02/2012  . Contact lens/glasses fitting     wears contacts or glasses   Past Surgical History  Procedure Laterality Date  . Ganglion cyst excision  6/99    rt wrist  . Colonoscopy     History   Social History  . Marital Status: Married    Spouse Name: N/A    Number of Children: N/A  . 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 Smoking status: Never Smoker   . Smokeless tobacco: None  . Alcohol Use: Yes     Comment: rare  . Drug Use: No  . Sexual Activity: Yes    Partners: Male   Other Topics Concern  . None   Social History Narrative   Exercise-- very little   Family History  Problem Relation Age of Onset  . Coronary artery disease      female 1st degree relative <50  . Pancreatic cancer Mother   . Diabetes Mother   . Heart failure Father   . Kidney failure Father     end stage  . Heart disease Father     chf, cabg  . Hypertension Father   . Hypertension Brother   . Hyperlipidemia Brother    No Known Allergies Prior to Admission medications   Medication Sig Start Date End Date Taking? Authorizing Provider  aspirin 81 MG EC tablet Take 81 mg by mouth daily.     Historical Provider, MD  atorvastatin (LIPITOR) 20 MG tablet Take 1 tablet (20 mg total) by mouth daily. 07/15/12   Lelon Perla, DO  Calcium-Vitamin D-Vitamin K 500-500-40 MG-UNT-MCG CHEW 1 po tid 06/30/11   Lelon Perla, DO  cetirizine (ZYRTEC) 10 MG tablet Take 10 mg by mouth daily.      Historical  Provider, MD  Coenzyme Q10 (COQ10) 30 MG CAPS Take by mouth 2 (two) times daily.      Historical Provider, MD  Multiple Vitamins-Iron (MULTIVITAMINS WITH IRON) TABS Take 1 tablet by mouth daily. 06/30/11   Lelon Perla, DO  Omega-3 350 MG CAPS Take by mouth.      Historical Provider, MD  polyethylene glycol (MIRALAX) powder Take 17 g by mouth as needed.      Historical Provider, MD  zolpidem (AMBIEN) 10 MG tablet Take 5 mg by mouth as needed.     Historical Provider, MD     Positive ROS: sore throats  All other systems have been reviewed and were otherwise negative with the exception of those mentioned in the HPI and as above.  Physical Exam: There were no vitals filed for this visit.  General: Alert, no acute distress Oral: Normal oral mucosa and 2+ cryptic tonsils, elongated uvula Nasal: Clear nasal passages Neck: No palpable adenopathy or thyroid nodules Ear: Ear canal is clear with normal appearing TMs Cardiovascular: Regular rate and rhythm, no murmur.  Respiratory: Clear to auscultation Neurologic: Alert and oriented x 3   Assessment/Plan: Chronic Tonsilitis Plan for  Procedure(s): TONSILLECTOMY   Dillard Cannon, MD 11/21/2012 3:56 PM

## 2012-11-22 ENCOUNTER — Encounter (HOSPITAL_BASED_OUTPATIENT_CLINIC_OR_DEPARTMENT_OTHER): Payer: Self-pay | Admitting: *Deleted

## 2012-11-22 ENCOUNTER — Ambulatory Visit (HOSPITAL_BASED_OUTPATIENT_CLINIC_OR_DEPARTMENT_OTHER)
Admission: RE | Admit: 2012-11-22 | Discharge: 2012-11-22 | Disposition: A | Discharge status: Home / Self Care | Payer: 59 | Source: Ambulatory Visit | Attending: Otolaryngology | Admitting: Otolaryngology

## 2012-11-22 ENCOUNTER — Ambulatory Visit (HOSPITAL_BASED_OUTPATIENT_CLINIC_OR_DEPARTMENT_OTHER): Payer: 59 | Admitting: Anesthesiology

## 2012-11-22 ENCOUNTER — Encounter (HOSPITAL_BASED_OUTPATIENT_CLINIC_OR_DEPARTMENT_OTHER): Payer: 59 | Admitting: Anesthesiology

## 2012-11-22 ENCOUNTER — Encounter (HOSPITAL_BASED_OUTPATIENT_CLINIC_OR_DEPARTMENT_OTHER)
Admission: RE | Disposition: A | Discharge status: Home / Self Care | Payer: Self-pay | Source: Ambulatory Visit | Attending: Otolaryngology

## 2012-11-22 DIAGNOSIS — H3530 Unspecified macular degeneration: Secondary | ICD-10-CM | POA: Insufficient documentation

## 2012-11-22 DIAGNOSIS — I10 Essential (primary) hypertension: Secondary | ICD-10-CM | POA: Insufficient documentation

## 2012-11-22 DIAGNOSIS — R0609 Other forms of dyspnea: Secondary | ICD-10-CM | POA: Insufficient documentation

## 2012-11-22 DIAGNOSIS — R599 Enlarged lymph nodes, unspecified: Secondary | ICD-10-CM | POA: Insufficient documentation

## 2012-11-22 DIAGNOSIS — E785 Hyperlipidemia, unspecified: Secondary | ICD-10-CM | POA: Insufficient documentation

## 2012-11-22 DIAGNOSIS — J3501 Chronic tonsillitis: Secondary | ICD-10-CM | POA: Insufficient documentation

## 2012-11-22 DIAGNOSIS — R0989 Other specified symptoms and signs involving the circulatory and respiratory systems: Secondary | ICD-10-CM | POA: Insufficient documentation

## 2012-11-22 HISTORY — DX: Encounter for fitting and adjustment of spectacles and contact lenses: Z46.0

## 2012-11-22 HISTORY — PX: TONSILLECTOMY: SHX5217

## 2012-11-22 LAB — POCT HEMOGLOBIN-HEMACUE: Hemoglobin: 14.3 g/dL (ref 12.0–15.0)

## 2012-11-22 SURGERY — TONSILLECTOMY
Anesthesia: General | Site: Throat | Wound class: Clean Contaminated

## 2012-11-22 MED ORDER — HYDROCODONE-ACETAMINOPHEN 7.5-325 MG/15ML PO SOLN: 10.0000 mL | Freq: Four times a day (QID) | ORAL | Status: DC | PRN

## 2012-11-22 MED ORDER — FENTANYL CITRATE 0.05 MG/ML IJ SOLN
INTRAMUSCULAR | Status: AC
  Filled 2012-11-22: qty 4

## 2012-11-22 MED ORDER — LIDOCAINE HCL (CARDIAC) 20 MG/ML IV SOLN
INTRAVENOUS | Status: DC | PRN
  Administered 2012-11-22 (×2): 50 mg via INTRAVENOUS

## 2012-11-22 MED ORDER — OXYMETAZOLINE HCL 0.05 % NA SOLN
NASAL | Status: AC
  Filled 2012-11-22: qty 15

## 2012-11-22 MED ORDER — SUCCINYLCHOLINE CHLORIDE 20 MG/ML IJ SOLN
INTRAMUSCULAR | Status: AC
  Filled 2012-11-22: qty 1

## 2012-11-22 MED ORDER — HYDROMORPHONE HCL PF 1 MG/ML IJ SOLN
0.2500 mg | INTRAMUSCULAR | Status: DC | PRN
  Administered 2012-11-22 (×2): 0.5 mg via INTRAVENOUS

## 2012-11-22 MED ORDER — OXYCODONE HCL 5 MG PO TABS: 5.0000 mg | ORAL_TABLET | Freq: Once | ORAL | Status: DC | PRN

## 2012-11-22 MED ORDER — LACTATED RINGERS IV SOLN
INTRAVENOUS | Status: DC
  Administered 2012-11-22: 08:00:00 via INTRAVENOUS

## 2012-11-22 MED ORDER — PROPOFOL 10 MG/ML IV BOLUS
INTRAVENOUS | Status: DC | PRN
  Administered 2012-11-22: 200 mg via INTRAVENOUS

## 2012-11-22 MED ORDER — AMOXICILLIN 250 MG/5ML PO SUSR: 500.0000 mg | Freq: Two times a day (BID) | ORAL | Status: AC

## 2012-11-22 MED ORDER — DEXAMETHASONE SODIUM PHOSPHATE 4 MG/ML IJ SOLN
INTRAMUSCULAR | Status: DC | PRN
  Administered 2012-11-22: 10 mg via INTRAVENOUS

## 2012-11-22 MED ORDER — CEFAZOLIN SODIUM-DEXTROSE 2-3 GM-% IV SOLR
INTRAVENOUS | Status: DC | PRN
  Administered 2012-11-22: 2 g via INTRAVENOUS

## 2012-11-22 MED ORDER — MIDAZOLAM HCL 5 MG/5ML IJ SOLN
INTRAMUSCULAR | Status: DC | PRN
  Administered 2012-11-22: 2 mg via INTRAVENOUS

## 2012-11-22 MED ORDER — ONDANSETRON HCL 4 MG/2ML IJ SOLN
INTRAMUSCULAR | Status: DC | PRN
  Administered 2012-11-22: 4 mg via INTRAVENOUS

## 2012-11-22 MED ORDER — SUCCINYLCHOLINE CHLORIDE 20 MG/ML IJ SOLN
INTRAMUSCULAR | Status: DC | PRN
  Administered 2012-11-22: 100 mg via INTRAVENOUS

## 2012-11-22 MED ORDER — PROPOFOL 10 MG/ML IV EMUL
INTRAVENOUS | Status: AC
  Filled 2012-11-22: qty 50

## 2012-11-22 MED ORDER — MIDAZOLAM HCL 2 MG/2ML IJ SOLN
INTRAMUSCULAR | Status: AC
  Filled 2012-11-22: qty 2

## 2012-11-22 MED ORDER — ACETAMINOPHEN 10 MG/ML IV SOLN
1000.0000 mg | Freq: Once | INTRAVENOUS | Status: AC
  Administered 2012-11-22: 1000 mg via INTRAVENOUS

## 2012-11-22 MED ORDER — ACETAMINOPHEN 10 MG/ML IV SOLN
INTRAVENOUS | Status: AC
  Filled 2012-11-22: qty 100

## 2012-11-22 MED ORDER — FENTANYL CITRATE 0.05 MG/ML IJ SOLN
INTRAMUSCULAR | Status: DC | PRN
  Administered 2012-11-22: 100 ug via INTRAVENOUS

## 2012-11-22 MED ORDER — HYDROMORPHONE HCL PF 1 MG/ML IJ SOLN
INTRAMUSCULAR | Status: AC
  Filled 2012-11-22: qty 1

## 2012-11-22 MED ORDER — OXYCODONE HCL 5 MG/5ML PO SOLN: 5.0000 mg | Freq: Once | ORAL | Status: DC | PRN

## 2012-11-22 MED ORDER — PROMETHAZINE HCL 25 MG/ML IJ SOLN: 6.2500 mg | INTRAMUSCULAR | Status: DC | PRN

## 2012-11-22 MED ORDER — ONDANSETRON HCL 4 MG/5ML PO SOLN: 4.0000 mg | Freq: Three times a day (TID) | ORAL | Status: DC | PRN

## 2012-11-22 SURGICAL SUPPLY — 26 items
BANDAGE COBAN STERILE 2 (GAUZE/BANDAGES/DRESSINGS) IMPLANT
CANISTER SUCT 1200ML W/VALVE (MISCELLANEOUS) ×2 IMPLANT
CATH ROBINSON RED A/P 12FR (CATHETERS) IMPLANT
CATH ROBINSON RED A/P 14FR (CATHETERS) ×1 IMPLANT
COAGULATOR SUCT SWTCH 10FR 6 (ELECTROSURGICAL) IMPLANT
COVER MAYO STAND STRL (DRAPES) ×2 IMPLANT
ELECT COATED BLADE 2.86 ST (ELECTRODE) ×2 IMPLANT
ELECT REM PT RETURN 9FT ADLT (ELECTROSURGICAL) ×2
ELECT REM PT RETURN 9FT PED (ELECTROSURGICAL)
ELECTRODE REM PT RETRN 9FT PED (ELECTROSURGICAL) IMPLANT
ELECTRODE REM PT RTRN 9FT ADLT (ELECTROSURGICAL) IMPLANT
GAUZE SPONGE 4X4 12PLY STRL LF (GAUZE/BANDAGES/DRESSINGS) ×2 IMPLANT
GLOVE SS BIOGEL STRL SZ 7.5 (GLOVE) ×1 IMPLANT
GLOVE SUPERSENSE BIOGEL SZ 7.5 (GLOVE) ×1
GLOVE SURG SS PI 7.0 STRL IVOR (GLOVE) ×1 IMPLANT
GOWN PREVENTION PLUS XLARGE (GOWN DISPOSABLE) ×1 IMPLANT
MARKER SKIN DUAL TIP RULER LAB (MISCELLANEOUS) IMPLANT
NS IRRIG 1000ML POUR BTL (IV SOLUTION) ×2 IMPLANT
PENCIL FOOT CONTROL (ELECTRODE) ×2 IMPLANT
SHEET MEDIUM DRAPE 40X70 STRL (DRAPES) ×2 IMPLANT
SOLUTION BUTLER CLEAR DIP (MISCELLANEOUS) IMPLANT
SPONGE TONSIL 1 RF SGL (DISPOSABLE) IMPLANT
SPONGE TONSIL 1.25 RF SGL STRG (GAUZE/BANDAGES/DRESSINGS) ×1 IMPLANT
SYR BULB 3OZ (MISCELLANEOUS) ×2 IMPLANT
TOWEL OR 17X24 6PK STRL BLUE (TOWEL DISPOSABLE) ×2 IMPLANT
TUBE CONNECTING 20X1/4 (TUBING) ×2 IMPLANT

## 2012-11-22 NOTE — Transfer of Care (Signed)
Immediate Anesthesia Transfer of Care Note  Patient: Rachel Kline  Procedure(s) Performed: Procedure(s): TONSILLECTOMY (N/A)  Patient Location: PACU  Anesthesia Type:General  Level of Consciousness: awake, alert  and oriented  Airway & Oxygen Therapy: Patient Spontanous Breathing and Patient connected to face mask oxygen  Post-op Assessment: Report given to PACU RN and Post -op Vital signs reviewed and stable  Post vital signs: Reviewed and stable  Complications: No apparent anesthesia complications

## 2012-11-22 NOTE — Brief Op Note (Signed)
11/22/2012  8:25 AM  PATIENT:  Kateri Mc  57 y.o. female  PRE-OPERATIVE DIAGNOSIS:  Chronic Tonsilitis  POST-OPERATIVE DIAGNOSIS:  Chronic Tonsilitis  PROCEDURE:  Procedure(s): TONSILLECTOMY (N/A)  SURGEON:  Surgeon(s) and Role:    * [name], MD - Primary  PHYSICIAN ASSISTANT:   ASSISTANTS: none   ANESTHESIA:   general  EBL:  Total I/O In: 500 [I.V.:500] Out: -   BLOOD ADMINISTERED:none  DRAINS: none   LOCAL MEDICATIONS USED:  NONE  SPECIMEN:  Source of Specimen:  tonsils  DISPOSITION OF SPECIMEN:  PATHOLOGY  COUNTS:  YES  TOURNIQUET:  * No tourniquets in log *  DICTATION: .Other Dictation: Dictation Number [id number]  PLAN OF CARE: Discharge to home after PACU  PATIENT DISPOSITION:  PACU - hemodynamically stable.   Delay start of Pharmacological VTE agent (>24hrs) due to surgical blood loss or risk of bleeding: yes

## 2012-11-22 NOTE — Anesthesia Procedure Notes (Signed)
Procedure Name: Intubation Date/Time: 11/22/2012 7:50 AM Performed by: Zenia Resides D Pre-anesthesia Checklist: Patient identified, Emergency Drugs available, Suction available and Patient being monitored Patient Re-evaluated:Patient Re-evaluated prior to inductionOxygen Delivery Method: Circle System Utilized Preoxygenation: Pre-oxygenation with 100% oxygen Intubation Type: IV induction Ventilation: Mask ventilation without difficulty Grade View: Grade II Tube type: Oral Tube size: 7.0 mm Number of attempts: 1 Airway Equipment and Method: stylet,  oral airway and Video-laryngoscopy Placement Confirmation: ETT inserted through vocal cords under direct vision,  positive ETCO2 and breath sounds checked- equal and bilateral Secured at: 22 cm Tube secured with: Tape Dental Injury: Teeth and Oropharynx as per pre-operative assessment  Difficulty Due To: Difficulty was anticipated, Difficult Airway- due to anterior larynx and Difficult Airway- due to limited oral opening

## 2012-11-22 NOTE — Interval H&P Note (Signed)
History and Physical Interval Note:  11/22/2012 7:34 AM  Rachel Kline  has presented today for surgery, with the diagnosis of Chronic Tonsilitis  The various methods of treatment have been discussed with the patient and family. After consideration of risks, benefits and other options for treatment, the patient has consented to  Procedure(s): TONSILLECTOMY (N/A) as a surgical intervention .  The patient's history has been reviewed, patient examined, no change in status, stable for surgery.  I have reviewed the patient's chart and labs.  Questions were answered to the patient's satisfaction.     NEWMAN, CHRISTOPHER

## 2012-11-22 NOTE — Anesthesia Postprocedure Evaluation (Signed)
  Anesthesia Post-op Note  Patient: Rachel Kline  Procedure(s) Performed: Procedure(s): TONSILLECTOMY (N/A)  Patient Location: PACU  Anesthesia Type:General  Level of Consciousness: awake  Airway and Oxygen Therapy: Patient Spontanous Breathing  Post-op Pain: mild  Post-op Assessment: Post-op Vital signs reviewed  Post-op Vital Signs: stable  Complications: No apparent anesthesia complications

## 2012-11-22 NOTE — Anesthesia Preprocedure Evaluation (Addendum)
Anesthesia Evaluation  Patient identified by MRN, date of birth, ID band Patient awake    Reviewed: Allergy & Precautions, H&P , NPO status , Patient's Chart, lab work & pertinent test results  History of Anesthesia Complications Negative for: history of anesthetic complications  Airway Mallampati: II  Neck ROM: full  Mouth opening: Limited Mouth Opening  Dental no notable dental hx. (+) Teeth Intact   Pulmonary neg pulmonary ROS,  breath sounds clear to auscultation  Pulmonary exam normal       Cardiovascular negative cardio ROS  IRhythm:regular Rate:Normal     Neuro/Psych negative neurological ROS  negative psych ROS   GI/Hepatic negative GI ROS, Neg liver ROS,   Endo/Other  negative endocrine ROS  Renal/GU negative Renal ROS  negative genitourinary   Musculoskeletal   Abdominal   Peds  Hematology negative hematology ROS (+)   Anesthesia Other Findings   Reproductive/Obstetrics negative OB ROS                          Anesthesia Physical Anesthesia Plan  ASA: I  Anesthesia Plan: General   Post-op Pain Management:    Induction: Intravenous  Airway Management Planned: Oral ETT  Additional Equipment:   Intra-op Plan:   Post-operative Plan: Extubation in OR  Informed Consent: I have reviewed the patients History and Physical, chart, labs and discussed the procedure including the risks, benefits and alternatives for the proposed anesthesia with the patient or authorized representative who has indicated his/her understanding and acceptance.     Plan Discussed with: CRNA and Surgeon  Anesthesia Plan Comments:         Anesthesia Quick Evaluation

## 2012-11-25 ENCOUNTER — Encounter (HOSPITAL_BASED_OUTPATIENT_CLINIC_OR_DEPARTMENT_OTHER): Payer: Self-pay | Admitting: Otolaryngology

## 2012-11-25 NOTE — Op Note (Signed)
NAMESHALANE, FLORENDO              ACCOUNT NO.:  [id number]  MEDICAL RECORD NO.:  [id number]  LOCATION:                               FACILITY:  [facility]  PHYSICIAN:  Kristine Garbe. Ezzard Standing, M.D.DATE OF BIRTH:  Nov 26, 1955  DATE OF PROCEDURE:  11/22/2012 DATE OF DISCHARGE:  11/22/2012                              OPERATIVE REPORT   PREOPERATIVE DIAGNOSIS:  Chronic cryptic tonsillitis.  POSTOPERATIVE DIAGNOSIS:  Chronic cryptic tonsillitis.  OPERATION:  Tonsillectomy.  SURGEON:  Kristine Garbe. Ezzard Standing, M.D.  ANESTHESIA:  General endotracheal.  COMPLICATIONS:  None.  BRIEF CLINICAL NOTE:  Rachel Kline is a 57 year old female who has had chronic recurrent sore throats as well as frequent tonsil stones.  On exam, she has moderate-sized cryptic tonsils bilaterally.  She does have history of snoring.  She is taken to operating room at this time for tonsillectomy.  DESCRIPTION OF PROCEDURE:  After adequate endotracheal anesthesia, the patient received 1 g Ancef IV preoperatively as well as 10 mg of Decadron.  A mouth gag was used to expose the oropharynx.  The left and right tonsils were resected from tonsillar fossa using the cautery. Hemostasis was obtained with cautery.  After obtaining adequate hemostasis, the oropharynx was irrigated with saline.  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  This completed the procedure.  Rachel Kline was awoken from anesthesia and transferred to the recovery room postop doing well.  DISPOSITION:  Rachel Kline was discharged home this morning on amoxicillin suspension 500 mg b.i.d. for 1 week, Tylenol, hydrocodone elixir 2-3 teaspoons q.4 hours p.r.n. pain, Zofran for nausea.  She will follow up in office in 10-14 days for recheck.          ______________________________ Kristine Garbe. Ezzard Standing, M.D.     CEN/MEDQ  D:  11/22/2012  T:  11/23/2012  Job:  [id number]

## 2013-03-28 ENCOUNTER — Other Ambulatory Visit (INDEPENDENT_AMBULATORY_CARE_PROVIDER_SITE_OTHER): Payer: 59

## 2013-03-28 DIAGNOSIS — E785 Hyperlipidemia, unspecified: Secondary | ICD-10-CM

## 2013-03-28 LAB — LIPID PANEL
Cholesterol: 159 mg/dL (ref 0–200)
HDL: 53.6 mg/dL (ref >39.00)
LDL Cholesterol: 84 mg/dL (ref 0–99)
Total CHOL/HDL Ratio: 3
Triglycerides: 108 mg/dL (ref 0.0–149.0)
VLDL: 21.6 mg/dL (ref 0.0–40.0)

## 2013-03-28 LAB — HEPATIC FUNCTION PANEL
ALT: 29 U/L (ref 0–35)
AST: 31 U/L (ref 0–37)
Albumin: 4.5 g/dL (ref 3.5–5.2)
Alkaline Phosphatase: 61 U/L (ref 39–117)
BILIRUBIN DIRECT: 0 mg/dL (ref 0.0–0.3)
BILIRUBIN TOTAL: 0.5 mg/dL (ref 0.3–1.2)
Total Protein: 7.1 g/dL (ref 6.0–8.3)

## 2013-06-18 ENCOUNTER — Telehealth: Payer: Self-pay | Admitting: Family Medicine

## 2013-06-18 DIAGNOSIS — E785 Hyperlipidemia, unspecified: Secondary | ICD-10-CM

## 2013-06-18 MED ORDER — ATORVASTATIN CALCIUM 20 MG PO TABS: 20.0000 mg | ORAL_TABLET | Freq: Every day | ORAL | Status: DC

## 2013-06-18 NOTE — Telephone Encounter (Signed)
Rx sent, MSG left making the patient aware.     KP

## 2013-06-18 NOTE — Telephone Encounter (Signed)
Caller name:Cheyeanne Tapanes Relation to [id number] Call back number: [phone] Pharmacy:[facility], [place].   Reason for call: patient called and stated that she was not sure if she needs an additional apt to have her Lipitor refilled. Patient does have an upcoming apt on 08/06/2013 for a physical. Please advise.

## 2013-07-08 ENCOUNTER — Other Ambulatory Visit: Payer: Self-pay | Admitting: Obstetrics and Gynecology

## 2013-07-09 LAB — CYTOLOGY - PAP

## 2013-08-06 ENCOUNTER — Encounter: Payer: 59 | Admitting: Family Medicine

## 2013-08-26 ENCOUNTER — Ambulatory Visit (INDEPENDENT_AMBULATORY_CARE_PROVIDER_SITE_OTHER): Payer: 59 | Admitting: Family Medicine

## 2013-08-26 ENCOUNTER — Encounter: Payer: Self-pay | Admitting: Family Medicine

## 2013-08-26 VITALS — BP 114/68 | HR 87 | Temp 98.0°F | Ht 64.0 in | Wt 170.8 lb

## 2013-08-26 DIAGNOSIS — E785 Hyperlipidemia, unspecified: Secondary | ICD-10-CM

## 2013-08-26 DIAGNOSIS — Z2911 Encounter for prophylactic immunotherapy for respiratory syncytial virus (RSV): Secondary | ICD-10-CM

## 2013-08-26 DIAGNOSIS — Z23 Encounter for immunization: Secondary | ICD-10-CM | POA: Diagnosis not present

## 2013-08-26 DIAGNOSIS — Z0000 Encounter for general adult medical examination without abnormal findings: Secondary | ICD-10-CM | POA: Diagnosis not present

## 2013-08-26 LAB — BASIC METABOLIC PANEL
BUN: 11 mg/dL (ref 6–23)
CALCIUM: 9.5 mg/dL (ref 8.4–10.5)
CHLORIDE: 99 meq/L (ref 96–112)
CO2: 25 mEq/L (ref 19–32)
CREATININE: 0.6 mg/dL (ref 0.4–1.2)
GFR: 102.31 mL/min (ref >60.00)
GLUCOSE: 89 mg/dL (ref 70–99)
Potassium: 4.4 mEq/L (ref 3.5–5.1)
SODIUM: 136 meq/L (ref 135–145)

## 2013-08-26 LAB — POCT URINALYSIS DIPSTICK
Bilirubin, UA: NEGATIVE
Blood, UA: NEGATIVE
Glucose, UA: NEGATIVE
KETONES UA: NEGATIVE
Leukocytes, UA: NEGATIVE
Nitrite, UA: NEGATIVE
PH UA: 7.5
Protein, UA: NEGATIVE
Spec Grav, UA: <=1.005
UROBILINOGEN UA: 0.2

## 2013-08-26 LAB — CBC WITH DIFFERENTIAL/PLATELET
Basophils Absolute: 0 10*3/uL (ref 0.0–0.1)
Basophils Relative: 0.6 % (ref 0.0–3.0)
Eosinophils Absolute: 0.1 10*3/uL (ref 0.0–0.7)
Eosinophils Relative: 2.9 % (ref 0.0–5.0)
HCT: 43.7 % (ref 36.0–46.0)
HEMOGLOBIN: 14.6 g/dL (ref 12.0–15.0)
LYMPHS PCT: 27 % (ref 12.0–46.0)
Lymphs Abs: 1.4 10*3/uL (ref 0.7–4.0)
MCHC: 33.3 g/dL (ref 30.0–36.0)
MCV: 95.8 fl (ref 78.0–100.0)
MONO ABS: 0.3 10*3/uL (ref 0.1–1.0)
Monocytes Relative: 6.6 % (ref 3.0–12.0)
NEUTROS ABS: 3.2 10*3/uL (ref 1.4–7.7)
Neutrophils Relative %: 62.9 % (ref 43.0–77.0)
Platelets: 253 10*3/uL (ref 150.0–400.0)
RBC: 4.56 Mil/uL (ref 3.87–5.11)
RDW: 12.9 % (ref 11.5–15.5)
WBC: 5.1 10*3/uL (ref 4.0–10.5)

## 2013-08-26 LAB — HEPATIC FUNCTION PANEL
ALBUMIN: 4.4 g/dL (ref 3.5–5.2)
ALT: 34 U/L (ref 0–35)
AST: 32 U/L (ref 0–37)
Alkaline Phosphatase: 67 U/L (ref 39–117)
Bilirubin, Direct: 0.1 mg/dL (ref 0.0–0.3)
TOTAL PROTEIN: 7.4 g/dL (ref 6.0–8.3)
Total Bilirubin: 0.9 mg/dL (ref 0.2–1.2)

## 2013-08-26 LAB — LIPID PANEL
CHOL/HDL RATIO: 3
CHOLESTEROL: 175 mg/dL (ref 0–200)
HDL: 54.2 mg/dL (ref >39.00)
LDL CALC: 99 mg/dL (ref 0–99)
NonHDL: 120.8
Triglycerides: 108 mg/dL (ref 0.0–149.0)
VLDL: 21.6 mg/dL (ref 0.0–40.0)

## 2013-08-26 LAB — TSH: TSH: 1.19 u[IU]/mL (ref 0.35–4.50)

## 2013-08-26 NOTE — Patient Instructions (Signed)
Preventive Care for Adults A healthy lifestyle and preventive care can promote health and wellness. Preventive health guidelines for women include the following key practices.  A routine yearly physical is a good way to check with your health care provider about your health and preventive screening. It is a chance to share any concerns and updates on your health and to receive a thorough exam.  Visit your dentist for a routine exam and preventive care every 6 months. Brush your teeth twice a day and floss once a day. Good oral hygiene prevents tooth decay and gum disease.  The frequency of eye exams is based on your age, health, family medical history, use of contact lenses, and other factors. Follow your health care provider's recommendations for frequency of eye exams.  Eat a healthy diet. Foods like vegetables, fruits, whole grains, low-fat dairy products, and lean protein foods contain the nutrients you need without too many calories. Decrease your intake of foods high in solid fats, added sugars, and salt. Eat the right amount of calories for you.Get information about a proper diet from your health care provider, if necessary.  Regular physical exercise is one of the most important things you can do for your health. Most adults should get at least 150 minutes of moderate-intensity exercise (any activity that increases your heart rate and causes you to sweat) each week. In addition, most adults need muscle-strengthening exercises on 2 or more days a week.  Maintain a healthy weight. The body mass index (BMI) is a screening tool to identify possible weight problems. It provides an estimate of body fat based on height and weight. Your health care provider can find your BMI and can help you achieve or maintain a healthy weight.For adults 20 years and older:  A BMI below 18.5 is considered underweight.  A BMI of 18.5 to 24.9 is normal.  A BMI of 25 to 29.9 is considered overweight.  A BMI of  30 and above is considered obese.  Maintain normal blood lipids and cholesterol levels by exercising and minimizing your intake of saturated fat. Eat a balanced diet with plenty of fruit and vegetables. Blood tests for lipids and cholesterol should begin at age 76 and be repeated every 5 years. If your lipid or cholesterol levels are high, you are over 50, or you are at high risk for heart disease, you may need your cholesterol levels checked more frequently.Ongoing high lipid and cholesterol levels should be treated with medicines if diet and exercise are not working.  If you smoke, find out from your health care provider how to quit. If you do not use tobacco, do not start.  Lung cancer screening is recommended for adults aged 22-80 years who are at high risk for developing lung cancer because of a history of smoking. A yearly low-dose CT scan of the lungs is recommended for people who have at least a 30-pack-year history of smoking and are a current smoker or have quit within the past 15 years. A pack year of smoking is smoking an average of 1 pack of cigarettes a day for 1 year (for example: 1 pack a day for 30 years or 2 packs a day for 15 years). Yearly screening should continue until the smoker has stopped smoking for at least 15 years. Yearly screening should be stopped for people who develop a health problem that would prevent them from having lung cancer treatment.  If you are pregnant, do not drink alcohol. If you are breastfeeding,  be very cautious about drinking alcohol. If you are not pregnant and choose to drink alcohol, do not have more than 1 drink per day. One drink is considered to be 12 ounces (355 mL) of beer, 5 ounces (148 mL) of wine, or 1.5 ounces (44 mL) of liquor.  Avoid use of street drugs. Do not share needles with anyone. Ask for help if you need support or instructions about stopping the use of drugs.  High blood pressure causes heart disease and increases the risk of  stroke. Your blood pressure should be checked at least every 1 to 2 years. Ongoing high blood pressure should be treated with medicines if weight loss and exercise do not work.  If you are 3-86 years old, ask your health care provider if you should take aspirin to prevent strokes.  Diabetes screening involves taking a blood sample to check your fasting blood sugar level. This should be done once every 3 years, after age 67, if you are within normal weight and without risk factors for diabetes. Testing should be considered at a younger age or be carried out more frequently if you are overweight and have at least 1 risk factor for diabetes.  Breast cancer screening is essential preventive care for women. You should practice "breast self-awareness." This means understanding the normal appearance and feel of your breasts and may include breast self-examination. Any changes detected, no matter how small, should be reported to a health care provider. Women in their 8s and 30s should have a clinical breast exam (CBE) by a health care provider as part of a regular health exam every 1 to 3 years. After age 70, women should have a CBE every year. Starting at age 25, women should consider having a mammogram (breast X-ray test) every year. Women who have a family history of breast cancer should talk to their health care provider about genetic screening. Women at a high risk of breast cancer should talk to their health care providers about having an MRI and a mammogram every year.  Breast cancer gene (BRCA)-related cancer risk assessment is recommended for women who have family members with BRCA-related cancers. BRCA-related cancers include breast, ovarian, tubal, and peritoneal cancers. Having family members with these cancers may be associated with an increased risk for harmful changes (mutations) in the breast cancer genes BRCA1 and BRCA2. Results of the assessment will determine the need for genetic counseling and  BRCA1 and BRCA2 testing.  Routine pelvic exams to screen for cancer are no longer recommended for nonpregnant women who are considered low risk for cancer of the pelvic organs (ovaries, uterus, and vagina) and who do not have symptoms. Ask your health care provider if a screening pelvic exam is right for you.  If you have had past treatment for cervical cancer or a condition that could lead to cancer, you need Pap tests and screening for cancer for at least 20 years after your treatment. If Pap tests have been discontinued, your risk factors (such as having a new sexual partner) need to be reassessed to determine if screening should be resumed. Some women have medical problems that increase the chance of getting cervical cancer. In these cases, your health care provider may recommend more frequent screening and Pap tests.  The HPV test is an additional test that may be used for cervical cancer screening. The HPV test looks for the virus that can cause the cell changes on the cervix. The cells collected during the Pap test can be  tested for HPV. The HPV test could be used to screen women aged 30 years and older, and should be used in women of any age who have unclear Pap test results. After the age of 30, women should have HPV testing at the same frequency as a Pap test.  Colorectal cancer can be detected and often prevented. Most routine colorectal cancer screening begins at the age of 50 years and continues through age 75 years. However, your health care provider may recommend screening at an earlier age if you have risk factors for colon cancer. On a yearly basis, your health care provider may provide home test kits to check for hidden blood in the stool. Use of a small camera at the end of a tube, to directly examine the colon (sigmoidoscopy or colonoscopy), can detect the earliest forms of colorectal cancer. Talk to your health care provider about this at age 50, when routine screening begins. Direct  exam of the colon should be repeated every 5-10 years through age 75 years, unless early forms of pre-cancerous polyps or small growths are found.  People who are at an increased risk for hepatitis B should be screened for this virus. You are considered at high risk for hepatitis B if:  You were born in a country where hepatitis B occurs often. Talk with your health care provider about which countries are considered high risk.  Your parents were born in a high-risk country and you have not received a shot to protect against hepatitis B (hepatitis B vaccine).  You have HIV or AIDS.  You use needles to inject street drugs.  You live with, or have sex with, someone who has hepatitis B.  You get hemodialysis treatment.  You take certain medicines for conditions like cancer, organ transplantation, and autoimmune conditions.  Hepatitis C blood testing is recommended for all people born from 1945 through 1965 and any individual with known risks for hepatitis C.  Practice safe sex. Use condoms and avoid high-risk sexual practices to reduce the spread of sexually transmitted infections (STIs). STIs include gonorrhea, chlamydia, syphilis, trichomonas, herpes, HPV, and human immunodeficiency virus (HIV). Herpes, HIV, and HPV are viral illnesses that have no cure. They can result in disability, cancer, and death.  You should be screened for sexually transmitted illnesses (STIs) including gonorrhea and chlamydia if:  You are sexually active and are younger than 24 years.  You are older than 24 years and your health care provider tells you that you are at risk for this type of infection.  Your sexual activity has changed since you were last screened and you are at an increased risk for chlamydia or gonorrhea. Ask your health care provider if you are at risk.  If you are at risk of being infected with HIV, it is recommended that you take a prescription medicine daily to prevent HIV infection. This is  called preexposure prophylaxis (PrEP). You are considered at risk if:  You are a heterosexual woman, are sexually active, and are at increased risk for HIV infection.  You take drugs by injection.  You are sexually active with a partner who has HIV.  Talk with your health care provider about whether you are at high risk of being infected with HIV. If you choose to begin PrEP, you should first be tested for HIV. You should then be tested every 3 months for as long as you are taking PrEP.  Osteoporosis is a disease in which the bones lose minerals and strength   with aging. This can result in serious bone fractures or breaks. The risk of osteoporosis can be identified using a bone density scan. Women ages 65 years and over and women at risk for fractures or osteoporosis should discuss screening with their health care providers. Ask your health care provider whether you should take a calcium supplement or vitamin D to reduce the rate of osteoporosis.  Menopause can be associated with physical symptoms and risks. Hormone replacement therapy is available to decrease symptoms and risks. You should talk to your health care provider about whether hormone replacement therapy is right for you.  Use sunscreen. Apply sunscreen liberally and repeatedly throughout the day. You should seek shade when your shadow is shorter than you. Protect yourself by wearing long sleeves, pants, a wide-brimmed hat, and sunglasses year round, whenever you are outdoors.  Once a month, do a whole body skin exam, using a mirror to look at the skin on your back. Tell your health care provider of new moles, moles that have irregular borders, moles that are larger than a pencil eraser, or moles that have changed in shape or color.  Stay current with required vaccines (immunizations).  Influenza vaccine. All adults should be immunized every year.  Tetanus, diphtheria, and acellular pertussis (Td, Tdap) vaccine. Pregnant women should  receive 1 dose of Tdap vaccine during each pregnancy. The dose should be obtained regardless of the length of time since the last dose. Immunization is preferred during the 27th-36th week of gestation. An adult who has not previously received Tdap or who does not know her vaccine status should receive 1 dose of Tdap. This initial dose should be followed by tetanus and diphtheria toxoids (Td) booster doses every 10 years. Adults with an unknown or incomplete history of completing a 3-dose immunization series with Td-containing vaccines should begin or complete a primary immunization series including a Tdap dose. Adults should receive a Td booster every 10 years.  Varicella vaccine. An adult without evidence of immunity to varicella should receive 2 doses or a second dose if she has previously received 1 dose. Pregnant females who do not have evidence of immunity should receive the first dose after pregnancy. This first dose should be obtained before leaving the health care facility. The second dose should be obtained 4-8 weeks after the first dose.  Human papillomavirus (HPV) vaccine. Females aged 13-26 years who have not received the vaccine previously should obtain the 3-dose series. The vaccine is not recommended for use in pregnant females. However, pregnancy testing is not needed before receiving a dose. If a female is found to be pregnant after receiving a dose, no treatment is needed. In that case, the remaining doses should be delayed until after the pregnancy. Immunization is recommended for any person with an immunocompromised condition through the age of 26 years if she did not get any or all doses earlier. During the 3-dose series, the second dose should be obtained 4-8 weeks after the first dose. The third dose should be obtained 24 weeks after the first dose and 16 weeks after the second dose.  Zoster vaccine. One dose is recommended for adults aged 60 years or older unless certain conditions are  present.  Measles, mumps, and rubella (MMR) vaccine. Adults born before 1957 generally are considered immune to measles and mumps. Adults born in 1957 or later should have 1 or more doses of MMR vaccine unless there is a contraindication to the vaccine or there is laboratory evidence of immunity to   each of the three diseases. A routine second dose of MMR vaccine should be obtained at least 28 days after the first dose for students attending postsecondary schools, health care workers, or international travelers. People who received inactivated measles vaccine or an unknown type of measles vaccine during 1963-1967 should receive 2 doses of MMR vaccine. People who received inactivated mumps vaccine or an unknown type of mumps vaccine before 1979 and are at high risk for mumps infection should consider immunization with 2 doses of MMR vaccine. For females of childbearing age, rubella immunity should be determined. If there is no evidence of immunity, females who are not pregnant should be vaccinated. If there is no evidence of immunity, females who are pregnant should delay immunization until after pregnancy. Unvaccinated health care workers born before 1957 who lack laboratory evidence of measles, mumps, or rubella immunity or laboratory confirmation of disease should consider measles and mumps immunization with 2 doses of MMR vaccine or rubella immunization with 1 dose of MMR vaccine.  Pneumococcal 13-valent conjugate (PCV13) vaccine. When indicated, a person who is uncertain of her immunization history and has no record of immunization should receive the PCV13 vaccine. An adult aged 19 years or older who has certain medical conditions and has not been previously immunized should receive 1 dose of PCV13 vaccine. This PCV13 should be followed with a dose of pneumococcal polysaccharide (PPSV23) vaccine. The PPSV23 vaccine dose should be obtained at least 8 weeks after the dose of PCV13 vaccine. An adult aged 19  years or older who has certain medical conditions and previously received 1 or more doses of PPSV23 vaccine should receive 1 dose of PCV13. The PCV13 vaccine dose should be obtained 1 or more years after the last PPSV23 vaccine dose.  Pneumococcal polysaccharide (PPSV23) vaccine. When PCV13 is also indicated, PCV13 should be obtained first. All adults aged 65 years and older should be immunized. An adult younger than age 65 years who has certain medical conditions should be immunized. Any person who resides in a nursing home or long-term care facility should be immunized. An adult smoker should be immunized. People with an immunocompromised condition and certain other conditions should receive both PCV13 and PPSV23 vaccines. People with human immunodeficiency virus (HIV) infection should be immunized as soon as possible after diagnosis. Immunization during chemotherapy or radiation therapy should be avoided. Routine use of PPSV23 vaccine is not recommended for American Indians, [place] Natives, or people younger than 65 years unless there are medical conditions that require PPSV23 vaccine. When indicated, people who have unknown immunization and have no record of immunization should receive PPSV23 vaccine. One-time revaccination 5 years after the first dose of PPSV23 is recommended for people aged 19-64 years who have chronic kidney failure, nephrotic syndrome, asplenia, or immunocompromised conditions. People who received 1-2 doses of PPSV23 before age 65 years should receive another dose of PPSV23 vaccine at age 65 years or later if at least 5 years have passed since the previous dose. Doses of PPSV23 are not needed for people immunized with PPSV23 at or after age 65 years.  Meningococcal vaccine. Adults with asplenia or persistent complement component deficiencies should receive 2 doses of quadrivalent meningococcal conjugate (MenACWY-D) vaccine. The doses should be obtained at least 2 months apart.  Microbiologists working with certain meningococcal bacteria, military recruits, people at risk during an outbreak, and people who travel to or live in countries with a high rate of meningitis should be immunized. A first-year college student up through age   21 years who is living in a residence hall should receive a dose if she did not receive a dose on or after her 16th birthday. Adults who have certain high-risk conditions should receive one or more doses of vaccine.  Hepatitis A vaccine. Adults who wish to be protected from this disease, have certain high-risk conditions, work with hepatitis A-infected animals, work in hepatitis A research labs, or travel to or work in countries with a high rate of hepatitis A should be immunized. Adults who were previously unvaccinated and who anticipate close contact with an international adoptee during the first 60 days after arrival in the [place] from a country with a high rate of hepatitis A should be immunized.  Hepatitis B vaccine. Adults who wish to be protected from this disease, have certain high-risk conditions, may be exposed to blood or other infectious body fluids, are household contacts or sex partners of hepatitis B positive people, are clients or workers in certain care facilities, or travel to or work in countries with a high rate of hepatitis B should be immunized.  Haemophilus influenzae type b (Hib) vaccine. A previously unvaccinated person with asplenia or sickle cell disease or having a scheduled splenectomy should receive 1 dose of Hib vaccine. Regardless of previous immunization, a recipient of a hematopoietic stem cell transplant should receive a 3-dose series 6-12 months after her successful transplant. Hib vaccine is not recommended for adults with HIV infection. Preventive Services / Frequency Ages 64 to 68 years  Blood pressure check.** / Every 1 to 2 years.  Lipid and cholesterol check.** / Every 5 years beginning at age  22.  Clinical breast exam.** / Every 3 years for women in their 88s and 53s.  BRCA-related cancer risk assessment.** / For women who have family members with a BRCA-related cancer (breast, ovarian, tubal, or peritoneal cancers).  Pap test.** / Every 2 years from ages 90 through 51. Every 3 years starting at age 21 through age 56 or 3 with a history of 3 consecutive normal Pap tests.  HPV screening.** / Every 3 years from ages 24 through ages 1 to 46 with a history of 3 consecutive normal Pap tests.  Hepatitis C blood test.** / For any individual with known risks for hepatitis C.  Skin self-exam. / Monthly.  Influenza vaccine. / Every year.  Tetanus, diphtheria, and acellular pertussis (Tdap, Td) vaccine.** / Consult your health care provider. Pregnant women should receive 1 dose of Tdap vaccine during each pregnancy. 1 dose of Td every 10 years.  Varicella vaccine.** / Consult your health care provider. Pregnant females who do not have evidence of immunity should receive the first dose after pregnancy.  HPV vaccine. / 3 doses over 6 months, if 72 and younger. The vaccine is not recommended for use in pregnant females. However, pregnancy testing is not needed before receiving a dose.  Measles, mumps, rubella (MMR) vaccine.** / You need at least 1 dose of MMR if you were born in 1957 or later. You may also need a 2nd dose. For females of childbearing age, rubella immunity should be determined. If there is no evidence of immunity, females who are not pregnant should be vaccinated. If there is no evidence of immunity, females who are pregnant should delay immunization until after pregnancy.  Pneumococcal 13-valent conjugate (PCV13) vaccine.** / Consult your health care provider.  Pneumococcal polysaccharide (PPSV23) vaccine.** / 1 to 2 doses if you smoke cigarettes or if you have certain conditions.  Meningococcal vaccine.** /  1 dose if you are age 19 to 21 years and a first-year college  student living in a residence hall, or have one of several medical conditions, you need to get vaccinated against meningococcal disease. You may also need additional booster doses.  Hepatitis A vaccine.** / Consult your health care provider.  Hepatitis B vaccine.** / Consult your health care provider.  Haemophilus influenzae type b (Hib) vaccine.** / Consult your health care provider. Ages 40 to 64 years  Blood pressure check.** / Every 1 to 2 years.  Lipid and cholesterol check.** / Every 5 years beginning at age 20 years.  Lung cancer screening. / Every year if you are aged 55-80 years and have a 30-pack-year history of smoking and currently smoke or have quit within the past 15 years. Yearly screening is stopped once you have quit smoking for at least 15 years or develop a health problem that would prevent you from having lung cancer treatment.  Clinical breast exam.** / Every year after age 40 years.  BRCA-related cancer risk assessment.** / For women who have family members with a BRCA-related cancer (breast, ovarian, tubal, or peritoneal cancers).  Mammogram.** / Every year beginning at age 40 years and continuing for as long as you are in good health. Consult with your health care provider.  Pap test.** / Every 3 years starting at age 30 years through age 65 or 70 years with a history of 3 consecutive normal Pap tests.  HPV screening.** / Every 3 years from ages 30 years through ages 65 to 70 years with a history of 3 consecutive normal Pap tests.  Fecal occult blood test (FOBT) of stool. / Every year beginning at age 50 years and continuing until age 75 years. You may not need to do this test if you get a colonoscopy every 10 years.  Flexible sigmoidoscopy or colonoscopy.** / Every 5 years for a flexible sigmoidoscopy or every 10 years for a colonoscopy beginning at age 50 years and continuing until age 75 years.  Hepatitis C blood test.** / For all people born from 1945 through  1965 and any individual with known risks for hepatitis C.  Skin self-exam. / Monthly.  Influenza vaccine. / Every year.  Tetanus, diphtheria, and acellular pertussis (Tdap/Td) vaccine.** / Consult your health care provider. Pregnant women should receive 1 dose of Tdap vaccine during each pregnancy. 1 dose of Td every 10 years.  Varicella vaccine.** / Consult your health care provider. Pregnant females who do not have evidence of immunity should receive the first dose after pregnancy.  Zoster vaccine.** / 1 dose for adults aged 60 years or older.  Measles, mumps, rubella (MMR) vaccine.** / You need at least 1 dose of MMR if you were born in 1957 or later. You may also need a 2nd dose. For females of childbearing age, rubella immunity should be determined. If there is no evidence of immunity, females who are not pregnant should be vaccinated. If there is no evidence of immunity, females who are pregnant should delay immunization until after pregnancy.  Pneumococcal 13-valent conjugate (PCV13) vaccine.** / Consult your health care provider.  Pneumococcal polysaccharide (PPSV23) vaccine.** / 1 to 2 doses if you smoke cigarettes or if you have certain conditions.  Meningococcal vaccine.** / Consult your health care provider.  Hepatitis A vaccine.** / Consult your health care provider.  Hepatitis B vaccine.** / Consult your health care provider.  Haemophilus influenzae type b (Hib) vaccine.** / Consult your health care provider. Ages 65   years and over  Blood pressure check.** / Every 1 to 2 years.  Lipid and cholesterol check.** / Every 5 years beginning at age 22 years.  Lung cancer screening. / Every year if you are aged 73-80 years and have a 30-pack-year history of smoking and currently smoke or have quit within the past 15 years. Yearly screening is stopped once you have quit smoking for at least 15 years or develop a health problem that would prevent you from having lung cancer  treatment.  Clinical breast exam.** / Every year after age 4 years.  BRCA-related cancer risk assessment.** / For women who have family members with a BRCA-related cancer (breast, ovarian, tubal, or peritoneal cancers).  Mammogram.** / Every year beginning at age 40 years and continuing for as long as you are in good health. Consult with your health care provider.  Pap test.** / Every 3 years starting at age 9 years through age 34 or 91 years with 3 consecutive normal Pap tests. Testing can be stopped between 65 and 70 years with 3 consecutive normal Pap tests and no abnormal Pap or HPV tests in the past 10 years.  HPV screening.** / Every 3 years from ages 57 years through ages 64 or 45 years with a history of 3 consecutive normal Pap tests. Testing can be stopped between 65 and 70 years with 3 consecutive normal Pap tests and no abnormal Pap or HPV tests in the past 10 years.  Fecal occult blood test (FOBT) of stool. / Every year beginning at age 15 years and continuing until age 17 years. You may not need to do this test if you get a colonoscopy every 10 years.  Flexible sigmoidoscopy or colonoscopy.** / Every 5 years for a flexible sigmoidoscopy or every 10 years for a colonoscopy beginning at age 86 years and continuing until age 71 years.  Hepatitis C blood test.** / For all people born from [birth year] through 1967 and any individual with known risks for hepatitis C.  Osteoporosis screening.** / A one-time screening for women ages 83 years and over and women at risk for fractures or osteoporosis.  Skin self-exam. / Monthly.  Influenza vaccine. / Every year.  Tetanus, diphtheria, and acellular pertussis (Tdap/Td) vaccine.** / 1 dose of Td every 10 years.  Varicella vaccine.** / Consult your health care provider.  Zoster vaccine.** / 1 dose for adults aged 61 years or older.  Pneumococcal 13-valent conjugate (PCV13) vaccine.** / Consult your health care provider.  Pneumococcal  polysaccharide (PPSV23) vaccine.** / 1 dose for all adults aged 28 years and older.  Meningococcal vaccine.** / Consult your health care provider.  Hepatitis A vaccine.** / Consult your health care provider.  Hepatitis B vaccine.** / Consult your health care provider.  Haemophilus influenzae type b (Hib) vaccine.** / Consult your health care provider. ** Family history and personal history of risk and conditions may change your health care provider's recommendations. Document Released: 02/28/2001 Document Revised: 05/19/2013 Document Reviewed: 05/30/2010 [facility] Patient Information 2015 [place], [place]. This information is not intended to replace advice given to you by your health care provider. Make sure you discuss any questions you have with your health care provider.

## 2013-08-26 NOTE — Progress Notes (Signed)
Pre visit review using our clinic review tool, if applicable. No additional management support is needed unless otherwise documented below in the visit note. 

## 2013-08-26 NOTE — Progress Notes (Signed)
Subjective:     Rachel Kline is a 58 y.o. female and is here for a comprehensive physical exam. The patient reports no new problems.  History   Social History  . Marital Status: Married    Spouse Name: N/A    Number of Children: N/A  . 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 Smoking status: Never Smoker   . Smokeless tobacco: Not on file  . Alcohol Use: Yes     Comment: rare  . Drug Use: No  . Sexual Activity: Yes    Partners: Male   Other Topics Concern  . Not on file   Social History Narrative   Exercise-- very little   Health Maintenance  Topic Date Due  . Zostavax  04/02/2013  . Influenza Vaccine  10/26/2013 (Originally 08/16/2013)  . Mammogram  07/09/2015  . Tetanus/tdap  02/22/2016  . Pap Smear  07/08/2016  . Colonoscopy  06/20/2022    The following portions of the patient's history were reviewed and updated as appropriate:  She  has a past medical history of Hyperlipidemia; Macular degeneration (07/02/2012); and Contact lens/glasses fitting. She  does not have any pertinent problems on file. She  has past surgical history that includes Ganglion cyst excision (6/99); Colonoscopy; and Tonsillectomy (N/A, 11/22/2012). Her family history includes Coronary artery disease in an other family member; Diabetes in her mother; Heart disease in her father; Heart failure in her father; Hyperlipidemia in her brother; Hypertension in her brother and father; Kidney failure in her father; Pancreatic cancer in her mother. She  reports that she has never smoked. She does not have any smokeless tobacco history on file. She reports that she drinks alcohol. She reports that she does not use illicit drugs. She has a current medication list which includes the following prescription(s): aspirin, atorvastatin, calcium-vitamin d-vitamin k, cetirizine, coq10, multivitamins with iron,  omega-3, polyethylene glycol powder, and zolpidem. Current Outpatient Prescriptions on File Prior to Visit  Medication Sig Dispense Refill  . aspirin 81 MG EC tablet Take 81 mg by mouth daily.       Marland Kitchen atorvastatin (LIPITOR) 20 MG tablet Take 1 tablet (20 mg total) by mouth daily.  90 tablet  0  . Calcium-Vitamin D-Vitamin K 500-500-40 MG-UNT-MCG CHEW 1 po tid  60 tablet    . cetirizine (ZYRTEC) 10 MG tablet Take 10 mg by mouth daily.        . Coenzyme Q10 (COQ10) 30 MG CAPS Take by mouth 2 (two) times daily.        . Multiple Vitamins-Iron (MULTIVITAMINS WITH IRON) TABS Take 1 tablet by mouth daily.    0  . Omega-3 350 MG CAPS Take by mouth.        . polyethylene glycol (MIRALAX) powder Take 17 g by mouth as needed.        . zolpidem (AMBIEN) 10 MG tablet Take 5 mg by mouth as needed.        No current facility-administered medications on file prior to visit.   She has No Known Allergies..  Review of Systems Review of Systems  Constitutional: Negative for activity change, appetite change and fatigue.  HENT: Negative for hearing loss, congestion, tinnitus and ear discharge.  dentist q84m Eyes: Negative for visual disturbance (see optho q1y -- vision corrected to 20/20 with glasses).  Respiratory: Negative for cough,  chest tightness and shortness of breath.   Cardiovascular: Negative for chest pain, palpitations and leg swelling.  Gastrointestinal: Negative for abdominal pain, diarrhea, constipation and abdominal distention.  Genitourinary: Negative for urgency, frequency, decreased urine volume and difficulty urinating.  Musculoskeletal: Negative for back pain, arthralgias and gait problem.  Skin: Negative for color change, pallor and rash.  Neurological: Negative for dizziness, light-headedness, numbness and headaches.  Hematological: Negative for adenopathy. Does not bruise/bleed easily.  Psychiatric/Behavioral: Negative for suicidal ideas, confusion, sleep disturbance, self-injury,  dysphoric mood, decreased concentration and agitation.       Objective:    BP 114/68  Pulse 87  Temp(Src) 98 F (36.7 C) (Oral)  Ht 5\' 4"  (1.626 m)  Wt 170 lb 12.8 oz (77.474 kg)  BMI 29.30 kg/m2  SpO2 98% General appearance: alert, cooperative, appears stated age and no distress Head: Normocephalic, without obvious abnormality, atraumatic Eyes: negative findings: lids and lashes normal, conjunctivae and sclerae normal and pupils equal, round, reactive to light and accomodation Ears: normal TM's and external ear canals both ears Nose: Nares normal. Septum midline. Mucosa normal. No drainage or sinus tenderness. Throat: lips, mucosa, and tongue normal; teeth and gums normal Neck: no adenopathy, no carotid bruit, no JVD, supple, symmetrical, trachea midline and thyroid not enlarged, symmetric, no tenderness/mass/nodules Back: symmetric, no curvature. ROM normal. No CVA tenderness. Lungs: clear to auscultation bilaterally Breasts: gyn Heart: regular rate and rhythm, S1, S2 normal, no murmur, click, rub or gallop Abdomen: soft, non-tender; bowel sounds normal; no masses,  no organomegaly Pelvic: deferred--gyn Extremities: extremities normal, atraumatic, no cyanosis or edema Pulses: 2+ and symmetric Skin: Skin color, texture, turgor normal. No rashes or lesions Lymph nodes: Cervical, supraclavicular, and axillary nodes normal. Neurologic: Alert and oriented X 3, normal strength and tone. Normal symmetric reflexes. Normal coordination and gait Psych-- no depression, no anxiety      Assessment:    Healthy female exam.       Plan:    ghm utd Check labs See After Visit Summary for Counseling Recommendations   1. Preventative health care   - Basic metabolic panel - CBC with Differential - Hepatic function panel - Lipid panel - POCT urinalysis dipstick - TSH  2. Other and unspecified hyperlipidemia Check labs, con't meds - Basic metabolic panel - CBC with  Differential - Hepatic function panel - Lipid panel - POCT urinalysis dipstick - TSH  3. Need for shingles vaccine   - Varicella-zoster vaccine subcutaneous

## 2013-09-17 ENCOUNTER — Other Ambulatory Visit: Payer: Self-pay | Admitting: Family Medicine

## 2013-10-22 ENCOUNTER — Encounter: Payer: Self-pay | Admitting: Family Medicine

## 2013-12-04 ENCOUNTER — Encounter: Payer: Self-pay | Admitting: Podiatry

## 2013-12-04 ENCOUNTER — Ambulatory Visit (INDEPENDENT_AMBULATORY_CARE_PROVIDER_SITE_OTHER): Payer: 59 | Admitting: Podiatry

## 2013-12-04 VITALS — BP 144/82 | HR 75 | Resp 16

## 2013-12-04 DIAGNOSIS — M722 Plantar fascial fibromatosis: Secondary | ICD-10-CM

## 2013-12-04 DIAGNOSIS — L600 Ingrowing nail: Secondary | ICD-10-CM

## 2013-12-04 NOTE — Progress Notes (Signed)
Subjective:     Patient ID: Rachel Kline, female   DOB: 02-14-1955, 58 y.o.   MRN: [id number]  HPI patient presents with ingrown toenail left big toe that is sore and incurvated and she's tried to trim and soak without relief of symptoms   Review of Systems  All other systems reviewed and are negative.      Objective:   Physical Exam  Constitutional: She is oriented to person, place, and time.  Cardiovascular: Intact distal pulses.   Musculoskeletal: Normal range of motion.  Neurological: She is oriented to person, place, and time.  Skin: Skin is warm.  Vitals reviewed.  neurovascular status intact muscle strength adequate and range of motion within normal limits. Patient's found to have incurvated left hallux nail medial lateral borders with pain and also complains of moderate plantar fascial symptoms of both feet off and on in its duration. States that her toe is increasingly tender and I did note good digital perfusion and she's well oriented 3     Assessment:     Ingrown toenail left hallux medial lateral border with pain    Plan:     H&P and condition discussed. I've recommended removal of the corners and I explained the procedure to patient and risk. Patient wants surgery and today I infiltrated 60 mg Xylocaine Marcaine mixture remove the medial and lateral borders exposed matrix and apply chemical phenol 3 applications 30 seconds to each border followed by alcohol lavaged and sterile dressing. Instructed on soaks and reappoint

## 2013-12-04 NOTE — Progress Notes (Signed)
   Subjective:    Patient ID: Rachel Kline, female    DOB: 03/15/1955, 58 y.o.   MRN: [id number]  HPI Comments: "I have an ingrown"  Patient c/o tender 1st toe left, medial border for several months. Initially the toenail started lifting. Saw dermatologist. Gave cortisone cream-never helped. Now area is red and swollen.   1st toe right nail is bruised but not tender, just concerned.  Toe Pain       Review of Systems  All other systems reviewed and are negative.      Objective:   Physical Exam        Assessment & Plan:

## 2013-12-04 NOTE — Patient Instructions (Addendum)
ANTIBACTERIAL SOAP INSTRUCTIONS  THE DAY AFTER PROCEDURE  Please follow the instructions your doctor has marked.   Shower as usual. Before getting out, place a drop of antibacterial liquid soap (Dial) on a wet, clean washcloth.  Gently wipe washcloth over affected area.  Afterward, rinse the area with warm water.  Blot the area dry with a soft cloth and cover with antibiotic ointment (neosporin, polysporin, bacitracin) and band aid or gauze and tape  OR   Place 3-4 drops of antibacterial liquid soap in a quart of warm tap water.  Submerge foot into water for 20 minutes.  If bandage was applied after your procedure, leave on to allow for easy lift off, then remove and continue with soak for the remaining time.  Next, blot area dry with a soft cloth and cover with a bandage.  Apply other medications as directed by your doctor, such as cortisporin otic solution (eardrops) or neosporin antibiotic ointment   Long Term Care Instructions-Post Nail Surgery  You have had your ingrown toenail and root treated with a chemical.  This chemical causes a burn that will drain and ooze like a blister.  This can drain for 6-8 weeks or longer.  It is important to keep this area clean, covered, and follow the soaking instructions dispensed at the time of your surgery.  This area will eventually dry and form a scab.  Once the scab forms you no longer need to soak or apply a dressing.  If at any time you experience an increase in pain, redness,    swelling, or drainage, you should contact the office as soon as possible.   Plantar Fasciitis (Heel Spur Syndrome) with Rehab The plantar fascia is a fibrous, ligament-like, soft-tissue structure that spans the bottom of the foot. Plantar fasciitis is a condition that causes pain in the foot due to inflammation of the tissue. SYMPTOMS   Pain and tenderness on the underneath side of the foot.  Pain that worsens with standing or walking. CAUSES  Plantar fasciitis  is caused by irritation and injury to the plantar fascia on the underneath side of the foot. Common mechanisms of injury include:  Direct trauma to bottom of the foot.  Damage to a small nerve that runs under the foot where the main fascia attaches to the heel bone.  Stress placed on the plantar fascia due to bone spurs. RISK INCREASES WITH:   Activities that place stress on the plantar fascia (running, jumping, pivoting, or cutting).  Poor strength and flexibility.  Improperly fitted shoes.  Tight calf muscles.  Flat feet.  Failure to warm-up properly before activity.  Obesity. PREVENTION  Warm up and stretch properly before activity.  Allow for adequate recovery between workouts.  Maintain physical fitness:  Strength, flexibility, and endurance.  Cardiovascular fitness.  Maintain a health body weight.  Avoid stress on the plantar fascia.  Wear properly fitted shoes, including arch supports for individuals who have flat feet.  PROGNOSIS  If treated properly, then the symptoms of plantar fasciitis usually resolve without surgery. However, occasionally surgery is necessary.  RELATED COMPLICATIONS   Recurrent symptoms that may result in a chronic condition.  Problems of the lower back that are caused by compensating for the injury, such as limping.  Pain or weakness of the foot during push-off following surgery.  Chronic inflammation, scarring, and partial or complete fascia tear, occurring more often from repeated injections.  TREATMENT  Treatment initially involves the use of ice and medication to help  reduce pain and inflammation. The use of strengthening and stretching exercises may help reduce pain with activity, especially stretches of the Achilles tendon. These exercises may be performed at home or with a therapist. Your caregiver may recommend that you use heel cups of arch supports to help reduce stress on the plantar fascia. Occasionally, corticosteroid  injections are given to reduce inflammation. If symptoms persist for greater than 6 months despite non-surgical (conservative), then surgery may be recommended.   MEDICATION   If pain medication is necessary, then nonsteroidal anti-inflammatory medications, such as aspirin and ibuprofen, or other minor pain relievers, such as acetaminophen, are often recommended.  Do not take pain medication within 7 days before surgery.  Prescription pain relievers may be given if deemed necessary by your caregiver. Use only as directed and only as much as you need.  Corticosteroid injections may be given by your caregiver. These injections should be reserved for the most serious cases, because they may only be given a certain number of times.  HEAT AND COLD  Cold treatment (icing) relieves pain and reduces inflammation. Cold treatment should be applied for 10 to 15 minutes every 2 to 3 hours for inflammation and pain and immediately after any activity that aggravates your symptoms. Use ice packs or massage the area with a piece of ice (ice massage).  Heat treatment may be used prior to performing the stretching and strengthening activities prescribed by your caregiver, physical therapist, or athletic trainer. Use a heat pack or soak the injury in warm water.  SEEK IMMEDIATE MEDICAL CARE IF:  Treatment seems to offer no benefit, or the condition worsens.  Any medications produce adverse side effects.  EXERCISES- RANGE OF MOTION (ROM) AND STRETCHING EXERCISES - Plantar Fasciitis (Heel Spur Syndrome) These exercises may help you when beginning to rehabilitate your injury. Your symptoms may resolve with or without further involvement from your physician, physical therapist or athletic trainer. While completing these exercises, remember:   Restoring tissue flexibility helps normal motion to return to the joints. This allows healthier, less painful movement and activity.  An effective stretch should be held  for at least 30 seconds.  A stretch should never be painful. You should only feel a gentle lengthening or release in the stretched tissue.  RANGE OF MOTION - Toe Extension, Flexion  Sit with your right / left leg crossed over your opposite knee.  Grasp your toes and gently pull them back toward the top of your foot. You should feel a stretch on the bottom of your toes and/or foot.  Hold this stretch for 10 seconds.  Now, gently pull your toes toward the bottom of your foot. You should feel a stretch on the top of your toes and or foot.  Hold this stretch for 10 seconds. Repeat  times. Complete this stretch 3 times per day.   RANGE OF MOTION - Ankle Dorsiflexion, Active Assisted  Remove shoes and sit on a chair that is preferably not on a carpeted surface.  Place right / left foot under knee. Extend your opposite leg for support.  Keeping your heel down, slide your right / left foot back toward the chair until you feel a stretch at your ankle or calf. If you do not feel a stretch, slide your bottom forward to the edge of the chair, while still keeping your heel down.  Hold this stretch for 10 seconds. Repeat 3 times. Complete this stretch 2 times per day.   STRETCH  Gastroc, Standing  Place hands on wall.  Extend right / left leg, keeping the front knee somewhat bent.  Slightly point your toes inward on your back foot.  Keeping your right / left heel on the floor and your knee straight, shift your weight toward the wall, not allowing your back to arch.  You should feel a gentle stretch in the right / left calf. Hold this position for 10 seconds. Repeat 3 times. Complete this stretch 2 times per day.  STRETCH  Soleus, Standing  Place hands on wall.  Extend right / left leg, keeping the other knee somewhat bent.  Slightly point your toes inward on your back foot.  Keep your right / left heel on the floor, bend your back knee, and slightly shift your weight over the back  leg so that you feel a gentle stretch deep in your back calf.  Hold this position for 10 seconds. Repeat 3 times. Complete this stretch 2 times per day.  STRETCH  Gastrocsoleus, Standing  Note: This exercise can place a lot of stress on your foot and ankle. Please complete this exercise only if specifically instructed by your caregiver.   Place the ball of your right / left foot on a step, keeping your other foot firmly on the same step.  Hold on to the wall or a rail for balance.  Slowly lift your other foot, allowing your body weight to press your heel down over the edge of the step.  You should feel a stretch in your right / left calf.  Hold this position for 10 seconds.  Repeat this exercise with a slight bend in your right / left knee. Repeat 3 times. Complete this stretch 2 times per day.   STRENGTHENING EXERCISES - Plantar Fasciitis (Heel Spur Syndrome)  These exercises may help you when beginning to rehabilitate your injury. They may resolve your symptoms with or without further involvement from your physician, physical therapist or athletic trainer. While completing these exercises, remember:   Muscles can gain both the endurance and the strength needed for everyday activities through controlled exercises.  Complete these exercises as instructed by your physician, physical therapist or athletic trainer. Progress the resistance and repetitions only as guided.  STRENGTH - Towel Curls  Sit in a chair positioned on a non-carpeted surface.  Place your foot on a towel, keeping your heel on the floor.  Pull the towel toward your heel by only curling your toes. Keep your heel on the floor. Repeat 3 times. Complete this exercise 2 times per day.  STRENGTH - Ankle Inversion  Secure one end of a rubber exercise band/tubing to a fixed object (table, pole). Loop the other end around your foot just before your toes.  Place your fists between your knees. This will focus your  strengthening at your ankle.  Slowly, pull your big toe up and in, making sure the band/tubing is positioned to resist the entire motion.  Hold this position for 10 seconds.  Have your muscles resist the band/tubing as it slowly pulls your foot back to the starting position. Repeat 3 times. Complete this exercises 2 times per day.  Document Released: 01/02/2005 Document Revised: 03/27/2011 Document Reviewed: 04/16/2008 [facility] Patient Information 2014 [place], [place].

## 2014-02-26 ENCOUNTER — Other Ambulatory Visit: Payer: 59

## 2014-02-26 ENCOUNTER — Other Ambulatory Visit (INDEPENDENT_AMBULATORY_CARE_PROVIDER_SITE_OTHER): Payer: 59

## 2014-02-26 DIAGNOSIS — E785 Hyperlipidemia, unspecified: Secondary | ICD-10-CM

## 2014-02-26 LAB — BASIC METABOLIC PANEL
BUN: 16 mg/dL (ref 6–23)
CALCIUM: 9.8 mg/dL (ref 8.4–10.5)
CO2: 29 mEq/L (ref 19–32)
Chloride: 103 mEq/L (ref 96–112)
Creatinine, Ser: 0.64 mg/dL (ref 0.40–1.20)
GFR: 100.3 mL/min (ref >60.00)
Glucose, Bld: 100 mg/dL — ABNORMAL HIGH (ref 70–99)
POTASSIUM: 4.2 meq/L (ref 3.5–5.1)
Sodium: 137 mEq/L (ref 135–145)

## 2014-02-26 LAB — LIPID PANEL
CHOLESTEROL: 152 mg/dL (ref 0–200)
HDL: 48.7 mg/dL (ref >39.00)
LDL Cholesterol: 81 mg/dL (ref 0–99)
NonHDL: 103.3
Total CHOL/HDL Ratio: 3
Triglycerides: 112 mg/dL (ref 0.0–149.0)
VLDL: 22.4 mg/dL (ref 0.0–40.0)

## 2014-02-26 LAB — HEPATIC FUNCTION PANEL
ALBUMIN: 4.4 g/dL (ref 3.5–5.2)
ALT: 27 U/L (ref 0–35)
AST: 26 U/L (ref 0–37)
Alkaline Phosphatase: 60 U/L (ref 39–117)
Bilirubin, Direct: 0.1 mg/dL (ref 0.0–0.3)
TOTAL PROTEIN: 7 g/dL (ref 6.0–8.3)
Total Bilirubin: 0.5 mg/dL (ref 0.2–1.2)

## 2014-04-08 ENCOUNTER — Other Ambulatory Visit: Payer: Self-pay | Admitting: Family Medicine

## 2014-06-26 ENCOUNTER — Encounter: Payer: Self-pay | Admitting: Podiatry

## 2014-06-26 ENCOUNTER — Ambulatory Visit (INDEPENDENT_AMBULATORY_CARE_PROVIDER_SITE_OTHER): Payer: 59 | Admitting: Podiatry

## 2014-06-26 VITALS — BP 142/74 | HR 64 | Resp 18

## 2014-06-26 DIAGNOSIS — L600 Ingrowing nail: Secondary | ICD-10-CM | POA: Diagnosis not present

## 2014-06-26 NOTE — Patient Instructions (Signed)

## 2014-06-29 NOTE — Progress Notes (Signed)
Subjective:     Patient ID: Rachel Kline, female   DOB: March 07, 1955, 59 y.o.   MRN: [id number]  HPI patient points to left hallux medial side and states that it just continues to bubble up and give her trouble and that she's tried to soak it and trim on it without relief   Review of Systems     Objective:   Physical Exam Her vascular status intact muscle strength adequate with a small irritative area on the left hallux medial border that while the corner the nails been removed at is no more distal pain proximal there continues to be irritation    Assessment:     Probable small ingrown toenail that's incurvated into this medial border with pain    Plan:     Reviewed condition and recommended removal of the corner. I explained is no guarantee that this will get better long-term and may require more extensive procedure but were in a try this simpler procedure at the time. I explained risk to her and at this time I infiltrated the left hallux 60 [place]igan times like Marcaine mixture remove the corner found a small spicule that was buried deep into the tissue and applied phenol 3 applications 30 seconds followed by alcohol lavaged to the area. Gave instructions on soaks and reappoint

## 2014-08-04 ENCOUNTER — Other Ambulatory Visit: Payer: Self-pay | Admitting: Obstetrics and Gynecology

## 2014-08-05 LAB — CYTOLOGY - PAP

## 2014-08-31 ENCOUNTER — Telehealth: Payer: Self-pay | Admitting: Family Medicine

## 2014-08-31 NOTE — Telephone Encounter (Signed)
Caller name: Malaka Ruffner Relationship to patient: self Can be reached: [phone]   Reason for call: Pt called in regarding bill of $265.00. Insurance stated is was disallowed service. She states she spoke to someone a month ago and was told they would call her back in a few days after checking on it. Please call pt back.

## 2014-09-02 ENCOUNTER — Telehealth: Payer: Self-pay | Admitting: Family Medicine

## 2014-09-02 NOTE — Telephone Encounter (Signed)
pre visit letter mailed 09/01/14 °

## 2014-09-18 ENCOUNTER — Telehealth: Payer: Self-pay | Admitting: *Deleted

## 2014-09-18 ENCOUNTER — Encounter: Payer: Self-pay | Admitting: *Deleted

## 2014-09-18 NOTE — Telephone Encounter (Signed)
Pre-Visit Call completed with patient and chart updated.   Pre-Visit Info documented in Specialty Comments under [employer].    

## 2014-09-22 ENCOUNTER — Ambulatory Visit (INDEPENDENT_AMBULATORY_CARE_PROVIDER_SITE_OTHER): Payer: 59 | Admitting: Family Medicine

## 2014-09-22 ENCOUNTER — Encounter: Payer: Self-pay | Admitting: Family Medicine

## 2014-09-22 VITALS — BP 118/74 | HR 68 | Temp 98.3°F | Ht 64.0 in | Wt 170.6 lb

## 2014-09-22 DIAGNOSIS — Z0000 Encounter for general adult medical examination without abnormal findings: Secondary | ICD-10-CM | POA: Diagnosis not present

## 2014-09-22 DIAGNOSIS — Z86010 Personal history of colonic polyps: Secondary | ICD-10-CM | POA: Insufficient documentation

## 2014-09-22 DIAGNOSIS — E785 Hyperlipidemia, unspecified: Secondary | ICD-10-CM | POA: Diagnosis not present

## 2014-09-22 DIAGNOSIS — H3530 Unspecified macular degeneration: Secondary | ICD-10-CM

## 2014-09-22 LAB — COMPREHENSIVE METABOLIC PANEL
ALBUMIN: 4.5 g/dL (ref 3.5–5.2)
ALT: 28 U/L (ref 0–35)
AST: 30 U/L (ref 0–37)
Alkaline Phosphatase: 64 U/L (ref 39–117)
BUN: 11 mg/dL (ref 6–23)
CHLORIDE: 102 meq/L (ref 96–112)
CO2: 30 mEq/L (ref 19–32)
Calcium: 9.7 mg/dL (ref 8.4–10.5)
Creatinine, Ser: 0.65 mg/dL (ref 0.40–1.20)
GFR: 98.34 mL/min (ref >60.00)
Glucose, Bld: 105 mg/dL — ABNORMAL HIGH (ref 70–99)
POTASSIUM: 4.4 meq/L (ref 3.5–5.1)
Sodium: 139 mEq/L (ref 135–145)
Total Bilirubin: 0.4 mg/dL (ref 0.2–1.2)
Total Protein: 7.1 g/dL (ref 6.0–8.3)

## 2014-09-22 LAB — CBC WITH DIFFERENTIAL/PLATELET
BASOS PCT: 0.8 % (ref 0.0–3.0)
Basophils Absolute: 0 10*3/uL (ref 0.0–0.1)
EOS PCT: 3.2 % (ref 0.0–5.0)
Eosinophils Absolute: 0.2 10*3/uL (ref 0.0–0.7)
HEMATOCRIT: 42.8 % (ref 36.0–46.0)
HEMOGLOBIN: 14.5 g/dL (ref 12.0–15.0)
LYMPHS PCT: 26.5 % (ref 12.0–46.0)
Lymphs Abs: 1.5 10*3/uL (ref 0.7–4.0)
MCHC: 33.8 g/dL (ref 30.0–36.0)
MCV: 93.5 fl (ref 78.0–100.0)
MONOS PCT: 6 % (ref 3.0–12.0)
Monocytes Absolute: 0.3 10*3/uL (ref 0.1–1.0)
Neutro Abs: 3.6 10*3/uL (ref 1.4–7.7)
Neutrophils Relative %: 63.5 % (ref 43.0–77.0)
Platelets: 258 10*3/uL (ref 150.0–400.0)
RBC: 4.57 Mil/uL (ref 3.87–5.11)
RDW: 12.6 % (ref 11.5–15.5)
WBC: 5.6 10*3/uL (ref 4.0–10.5)

## 2014-09-22 LAB — LIPID PANEL
CHOL/HDL RATIO: 3
Cholesterol: 162 mg/dL (ref 0–200)
HDL: 56.5 mg/dL (ref >39.00)
LDL CALC: 77 mg/dL (ref 0–99)
NonHDL: 105.82
Triglycerides: 142 mg/dL (ref 0.0–149.0)
VLDL: 28.4 mg/dL (ref 0.0–40.0)

## 2014-09-22 LAB — HEPATITIS C ANTIBODY: HCV Ab: NEGATIVE

## 2014-09-22 LAB — TSH: TSH: 2.16 u[IU]/mL (ref 0.35–4.50)

## 2014-09-22 MED ORDER — ICAPS AREDS 2 PO CAPS: 2.0000 | ORAL_CAPSULE | Freq: Every day | ORAL | Status: DC

## 2014-09-22 NOTE — Progress Notes (Signed)
Subjective:     Rachel Kline is a 59 y.o. female and is here for a comprehensive physical exam. The patient reports no problems.  Social History   Social History  . Marital Status: Married    Spouse Name: N/A  . Number of Children: N/A  . 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 Smoking status: Never Smoker   . Smokeless tobacco: Not on file  . Alcohol Use: Yes     Comment: rare  . Drug Use: No  . Sexual Activity:    Partners: Male   Other Topics Concern  . Not on file   Social History Narrative   Exercise-- very little   Health Maintenance  Topic Date Due  . Hepatitis C Screening  16-Oct-1955  . HIV Screening  04/02/1968  . INFLUENZA VACCINE  09/22/2015 (Originally 08/17/2014)  . MAMMOGRAM  07/09/2015  . TETANUS/TDAP  02/22/2016  . PAP SMEAR  08/03/2017  . COLONOSCOPY  06/20/2022  . ZOSTAVAX  Completed    The following portions of the patient's history were reviewed and updated as appropriate:  She  has a past medical history of Hyperlipidemia; Macular degeneration (07/02/2012); and Contact lens/glasses fitting. She  does not have any pertinent problems on file. She  has past surgical history that includes Ganglion cyst excision (6/99); Colonoscopy; and Tonsillectomy (N/A, 11/22/2012). Her family history includes Coronary artery disease in an other family member; Diabetes in her mother; Heart disease in her father; Heart failure in her father; Hyperlipidemia in her brother; Hypertension in her brother and father; Kidney failure in her father; Pancreatic cancer in her mother. She  reports that she has never smoked. She does not have any smokeless tobacco history on file. She reports that she drinks alcohol. She reports that she does not use illicit drugs. She has a current medication list which includes the following prescription(s): aspirin, atorvastatin,  calcium-vitamin d-vitamin k, cetirizine, coq10, multivitamins with iron, omega-3, and polyethylene glycol powder. Current Outpatient Prescriptions on File Prior to Visit  Medication Sig Dispense Refill  . aspirin 81 MG EC tablet Take 81 mg by mouth daily.     Marland Kitchen atorvastatin (LIPITOR) 20 MG tablet TAKE 1 TABLET BY MOUTH DAILY. 90 tablet 1  . Calcium-Vitamin D-Vitamin K 500-500-40 MG-UNT-MCG CHEW 1 po tid 60 tablet   . cetirizine (ZYRTEC) 10 MG tablet Take 10 mg by mouth daily.      . Coenzyme Q10 (COQ10) 30 MG CAPS Take by mouth 2 (two) times daily.      . Multiple Vitamins-Iron (MULTIVITAMINS WITH IRON) TABS Take 1 tablet by mouth daily.  0  . Omega-3 350 MG CAPS Take by mouth.      . polyethylene glycol (MIRALAX) powder Take 17 g by mouth as needed.       No current facility-administered medications on file prior to visit.   She has No Known Allergies..  Review of Systems Review of Systems  Constitutional: Negative for activity change, appetite change and fatigue.  HENT: Negative for hearing loss, congestion, tinnitus and ear discharge.  dentist [id number]: Negative for visual disturbance (see optho q1y -- z0  Respiratory: Negative for cough, chest tightness and shortness of breath.   Cardiovascular: Negative for chest pain, palpitations and leg swelling.  Gastrointestinal: Negative for abdominal pain, diarrhea, constipation and abdominal distention.  Genitourinary: Negative for urgency,  frequency, decreased urine volume and difficulty urinating.  Musculoskeletal: Negative for back pain, arthralgias and gait problem.  Skin: Negative for color change, pallor and rash.  Neurological: Negative for dizziness, light-headedness, numbness and headaches.  Hematological: Negative for adenopathy. Does not bruise/bleed easily.  Psychiatric/Behavioral: Negative for suicidal ideas, confusion, sleep disturbance, self-injury, dysphoric mood, decreased concentration and agitation.       Objective:     BP 118/74 mmHg  Pulse 68  Temp(Src) 98.3 F (36.8 C) (Oral)  Ht _0  (1.626 m)  Wt 170 lb 9.6 oz (77.384 kg)  BMI 29.27 kg/m2  SpO2 97% General appearance: alert, cooperative, appears stated age and no distress Head: Normocephalic, without obvious abnormality, atraumatic Eyes: conjunctivae/corneas clear. PERRL, EOM's intact. Fundi benign. Ears: normal TM's and external ear canals both ears Nose: Nares normal. Septum midline. Mucosa normal. No drainage or sinus tenderness. Throat: lips, mucosa, and tongue normal; teeth and gums normal Neck: no adenopathy, no carotid bruit, no JVD, supple, symmetrical, trachea midline and thyroid not enlarged, symmetric, no tenderness/mass/nodules Back: symmetric, no curvature. ROM normal. No CVA tenderness. Lungs: clear to auscultation bilaterally Breasts: gyn Heart: regular rate and rhythm, S1, S2 normal, no murmur, click, rub or gallop Abdomen: soft, non-tender; bowel sounds normal; no masses,  no organomegaly Pelvic: deferred-- gyn Extremities: extremities normal, atraumatic, no cyanosis or edema Pulses: 2+ and symmetric Skin: Skin color, texture, turgor normal. No rashes or lesions Lymph nodes: Cervical, supraclavicular, and axillary nodes normal. Neurologic: Alert and oriented X 3, normal strength and tone. Normal symmetric reflexes. Normal coordination and gait Psych- no depression, anxiety      Assessment:    Healthy female exam.       Plan:  ghm utd Check labs   See After Visit Summary for Counseling Recommendations   1. Preventative health care Check labs ghm utd See AVS - Comp Met (CMET) - CBC with Differential/Platelet - Lipid panel - POCT urinalysis dipstick - TSH - HIV antibody - Hepatitis C antibody  2. Hyperlipidemia con't lipitor - Comp Met (CMET) - Lipid panel  3. History of colonic polyps   4. Macular degeneration  - Multiple Vitamins-Minerals (ICAPS AREDS 2) CAPS; Take 2 capsules by mouth daily.

## 2014-09-22 NOTE — Patient Instructions (Signed)
Preventive Care for Adults A healthy lifestyle and preventive care can promote health and wellness. Preventive health guidelines for women include the following key practices.  A routine yearly physical is a good way to check with your health care provider about your health and preventive screening. It is a chance to share any concerns and updates on your health and to receive a thorough exam.  Visit your dentist for a routine exam and preventive care every 6 months. Brush your teeth twice a day and floss once a day. Good oral hygiene prevents tooth decay and gum disease.  The frequency of eye exams is based on your age, health, family medical history, use of contact lenses, and other factors. Follow your health care provider's recommendations for frequency of eye exams.  Eat a healthy diet. Foods like vegetables, fruits, whole grains, low-fat dairy products, and lean protein foods contain the nutrients you need without too many calories. Decrease your intake of foods high in solid fats, added sugars, and salt. Eat the right amount of calories for you.Get information about a proper diet from your health care provider, if necessary.  Regular physical exercise is one of the most important things you can do for your health. Most adults should get at least 150 minutes of moderate-intensity exercise (any activity that increases your heart rate and causes you to sweat) each week. In addition, most adults need muscle-strengthening exercises on 2 or more days a week.  Maintain a healthy weight. The body mass index (BMI) is a screening tool to identify possible weight problems. It provides an estimate of body fat based on height and weight. Your health care provider can find your BMI and can help you achieve or maintain a healthy weight.For adults 20 years and older:  A BMI below 18.5 is considered underweight.  A BMI of 18.5 to 24.9 is normal.  A BMI of 25 to 29.9 is considered overweight.  A BMI of  30 and above is considered obese.  Maintain normal blood lipids and cholesterol levels by exercising and minimizing your intake of saturated fat. Eat a balanced diet with plenty of fruit and vegetables. Blood tests for lipids and cholesterol should begin at age 76 and be repeated every 5 years. If your lipid or cholesterol levels are high, you are over 50, or you are at high risk for heart disease, you may need your cholesterol levels checked more frequently.Ongoing high lipid and cholesterol levels should be treated with medicines if diet and exercise are not working.  If you smoke, find out from your health care provider how to quit. If you do not use tobacco, do not start.  Lung cancer screening is recommended for adults aged 22-80 years who are at high risk for developing lung cancer because of a history of smoking. A yearly low-dose CT scan of the lungs is recommended for people who have at least a 30-pack-year history of smoking and are a current smoker or have quit within the past 15 years. A pack year of smoking is smoking an average of 1 pack of cigarettes a day for 1 year (for example: 1 pack a day for 30 years or 2 packs a day for 15 years). Yearly screening should continue until the smoker has stopped smoking for at least 15 years. Yearly screening should be stopped for people who develop a health problem that would prevent them from having lung cancer treatment.  If you are pregnant, do not drink alcohol. If you are breastfeeding,  be very cautious about drinking alcohol. If you are not pregnant and choose to drink alcohol, do not have more than 1 drink per day. One drink is considered to be 12 ounces (355 mL) of beer, 5 ounces (148 mL) of wine, or 1.5 ounces (44 mL) of liquor.  Avoid use of street drugs. Do not share needles with anyone. Ask for help if you need support or instructions about stopping the use of drugs.  High blood pressure causes heart disease and increases the risk of  stroke. Your blood pressure should be checked at least every 1 to 2 years. Ongoing high blood pressure should be treated with medicines if weight loss and exercise do not work.  If you are 3-86 years old, ask your health care provider if you should take aspirin to prevent strokes.  Diabetes screening involves taking a blood sample to check your fasting blood sugar level. This should be done once every 3 years, after age 67, if you are within normal weight and without risk factors for diabetes. Testing should be considered at a younger age or be carried out more frequently if you are overweight and have at least 1 risk factor for diabetes.  Breast cancer screening is essential preventive care for women. You should practice "breast self-awareness." This means understanding the normal appearance and feel of your breasts and may include breast self-examination. Any changes detected, no matter how small, should be reported to a health care provider. Women in their 8s and 30s should have a clinical breast exam (CBE) by a health care provider as part of a regular health exam every 1 to 3 years. After age 70, women should have a CBE every year. Starting at age 25, women should consider having a mammogram (breast X-ray test) every year. Women who have a family history of breast cancer should talk to their health care provider about genetic screening. Women at a high risk of breast cancer should talk to their health care providers about having an MRI and a mammogram every year.  Breast cancer gene (BRCA)-related cancer risk assessment is recommended for women who have family members with BRCA-related cancers. BRCA-related cancers include breast, ovarian, tubal, and peritoneal cancers. Having family members with these cancers may be associated with an increased risk for harmful changes (mutations) in the breast cancer genes BRCA1 and BRCA2. Results of the assessment will determine the need for genetic counseling and  BRCA1 and BRCA2 testing.  Routine pelvic exams to screen for cancer are no longer recommended for nonpregnant women who are considered low risk for cancer of the pelvic organs (ovaries, uterus, and vagina) and who do not have symptoms. Ask your health care provider if a screening pelvic exam is right for you.  If you have had past treatment for cervical cancer or a condition that could lead to cancer, you need Pap tests and screening for cancer for at least 20 years after your treatment. If Pap tests have been discontinued, your risk factors (such as having a new sexual partner) need to be reassessed to determine if screening should be resumed. Some women have medical problems that increase the chance of getting cervical cancer. In these cases, your health care provider may recommend more frequent screening and Pap tests.  The HPV test is an additional test that may be used for cervical cancer screening. The HPV test looks for the virus that can cause the cell changes on the cervix. The cells collected during the Pap test can be  tested for HPV. The HPV test could be used to screen women aged 30 years and older, and should be used in women of any age who have unclear Pap test results. After the age of 30, women should have HPV testing at the same frequency as a Pap test.  Colorectal cancer can be detected and often prevented. Most routine colorectal cancer screening begins at the age of 50 years and continues through age 75 years. However, your health care provider may recommend screening at an earlier age if you have risk factors for colon cancer. On a yearly basis, your health care provider may provide home test kits to check for hidden blood in the stool. Use of a small camera at the end of a tube, to directly examine the colon (sigmoidoscopy or colonoscopy), can detect the earliest forms of colorectal cancer. Talk to your health care provider about this at age 50, when routine screening begins. Direct  exam of the colon should be repeated every 5-10 years through age 75 years, unless early forms of pre-cancerous polyps or small growths are found.  People who are at an increased risk for hepatitis B should be screened for this virus. You are considered at high risk for hepatitis B if:  You were born in a country where hepatitis B occurs often. Talk with your health care provider about which countries are considered high risk.  Your parents were born in a high-risk country and you have not received a shot to protect against hepatitis B (hepatitis B vaccine).  You have HIV or AIDS.  You use needles to inject street drugs.  You live with, or have sex with, someone who has hepatitis B.  You get hemodialysis treatment.  You take certain medicines for conditions like cancer, organ transplantation, and autoimmune conditions.  Hepatitis C blood testing is recommended for all people born from 1945 through 1965 and any individual with known risks for hepatitis C.  Practice safe sex. Use condoms and avoid high-risk sexual practices to reduce the spread of sexually transmitted infections (STIs). STIs include gonorrhea, chlamydia, syphilis, trichomonas, herpes, HPV, and human immunodeficiency virus (HIV). Herpes, HIV, and HPV are viral illnesses that have no cure. They can result in disability, cancer, and death.  You should be screened for sexually transmitted illnesses (STIs) including gonorrhea and chlamydia if:  You are sexually active and are younger than 24 years.  You are older than 24 years and your health care provider tells you that you are at risk for this type of infection.  Your sexual activity has changed since you were last screened and you are at an increased risk for chlamydia or gonorrhea. Ask your health care provider if you are at risk.  If you are at risk of being infected with HIV, it is recommended that you take a prescription medicine daily to prevent HIV infection. This is  called preexposure prophylaxis (PrEP). You are considered at risk if:  You are a heterosexual woman, are sexually active, and are at increased risk for HIV infection.  You take drugs by injection.  You are sexually active with a partner who has HIV.  Talk with your health care provider about whether you are at high risk of being infected with HIV. If you choose to begin PrEP, you should first be tested for HIV. You should then be tested every 3 months for as long as you are taking PrEP.  Osteoporosis is a disease in which the bones lose minerals and strength   with aging. This can result in serious bone fractures or breaks. The risk of osteoporosis can be identified using a bone density scan. Women ages 65 years and over and women at risk for fractures or osteoporosis should discuss screening with their health care providers. Ask your health care provider whether you should take a calcium supplement or vitamin D to reduce the rate of osteoporosis.  Menopause can be associated with physical symptoms and risks. Hormone replacement therapy is available to decrease symptoms and risks. You should talk to your health care provider about whether hormone replacement therapy is right for you.  Use sunscreen. Apply sunscreen liberally and repeatedly throughout the day. You should seek shade when your shadow is shorter than you. Protect yourself by wearing long sleeves, pants, a wide-brimmed hat, and sunglasses year round, whenever you are outdoors.  Once a month, do a whole body skin exam, using a mirror to look at the skin on your back. Tell your health care provider of new moles, moles that have irregular borders, moles that are larger than a pencil eraser, or moles that have changed in shape or color.  Stay current with required vaccines (immunizations).  Influenza vaccine. All adults should be immunized every year.  Tetanus, diphtheria, and acellular pertussis (Td, Tdap) vaccine. Pregnant women should  receive 1 dose of Tdap vaccine during each pregnancy. The dose should be obtained regardless of the length of time since the last dose. Immunization is preferred during the 27th-36th week of gestation. An adult who has not previously received Tdap or who does not know her vaccine status should receive 1 dose of Tdap. This initial dose should be followed by tetanus and diphtheria toxoids (Td) booster doses every 10 years. Adults with an unknown or incomplete history of completing a 3-dose immunization series with Td-containing vaccines should begin or complete a primary immunization series including a Tdap dose. Adults should receive a Td booster every 10 years.  Varicella vaccine. An adult without evidence of immunity to varicella should receive 2 doses or a second dose if she has previously received 1 dose. Pregnant females who do not have evidence of immunity should receive the first dose after pregnancy. This first dose should be obtained before leaving the health care facility. The second dose should be obtained 4-8 weeks after the first dose.  Human papillomavirus (HPV) vaccine. Females aged 13-26 years who have not received the vaccine previously should obtain the 3-dose series. The vaccine is not recommended for use in pregnant females. However, pregnancy testing is not needed before receiving a dose. If a female is found to be pregnant after receiving a dose, no treatment is needed. In that case, the remaining doses should be delayed until after the pregnancy. Immunization is recommended for any person with an immunocompromised condition through the age of 26 years if she did not get any or all doses earlier. During the 3-dose series, the second dose should be obtained 4-8 weeks after the first dose. The third dose should be obtained 24 weeks after the first dose and 16 weeks after the second dose.  Zoster vaccine. One dose is recommended for adults aged 60 years or older unless certain conditions are  present.  Measles, mumps, and rubella (MMR) vaccine. Adults born before 1957 generally are considered immune to measles and mumps. Adults born in 1957 or later should have 1 or more doses of MMR vaccine unless there is a contraindication to the vaccine or there is laboratory evidence of immunity to   each of the three diseases. A routine second dose of MMR vaccine should be obtained at least 28 days after the first dose for students attending postsecondary schools, health care workers, or international travelers. People who received inactivated measles vaccine or an unknown type of measles vaccine during 1963-1967 should receive 2 doses of MMR vaccine. People who received inactivated mumps vaccine or an unknown type of mumps vaccine before 1979 and are at high risk for mumps infection should consider immunization with 2 doses of MMR vaccine. For females of childbearing age, rubella immunity should be determined. If there is no evidence of immunity, females who are not pregnant should be vaccinated. If there is no evidence of immunity, females who are pregnant should delay immunization until after pregnancy. Unvaccinated health care workers born before 1957 who lack laboratory evidence of measles, mumps, or rubella immunity or laboratory confirmation of disease should consider measles and mumps immunization with 2 doses of MMR vaccine or rubella immunization with 1 dose of MMR vaccine.  Pneumococcal 13-valent conjugate (PCV13) vaccine. When indicated, a person who is uncertain of her immunization history and has no record of immunization should receive the PCV13 vaccine. An adult aged 19 years or older who has certain medical conditions and has not been previously immunized should receive 1 dose of PCV13 vaccine. This PCV13 should be followed with a dose of pneumococcal polysaccharide (PPSV23) vaccine. The PPSV23 vaccine dose should be obtained at least 8 weeks after the dose of PCV13 vaccine. An adult aged 19  years or older who has certain medical conditions and previously received 1 or more doses of PPSV23 vaccine should receive 1 dose of PCV13. The PCV13 vaccine dose should be obtained 1 or more years after the last PPSV23 vaccine dose.  Pneumococcal polysaccharide (PPSV23) vaccine. When PCV13 is also indicated, PCV13 should be obtained first. All adults aged 65 years and older should be immunized. An adult younger than age 65 years who has certain medical conditions should be immunized. Any person who resides in a nursing home or long-term care facility should be immunized. An adult smoker should be immunized. People with an immunocompromised condition and certain other conditions should receive both PCV13 and PPSV23 vaccines. People with human immunodeficiency virus (HIV) infection should be immunized as soon as possible after diagnosis. Immunization during chemotherapy or radiation therapy should be avoided. Routine use of PPSV23 vaccine is not recommended for American Indians, [place] Natives, or people younger than 65 years unless there are medical conditions that require PPSV23 vaccine. When indicated, people who have unknown immunization and have no record of immunization should receive PPSV23 vaccine. One-time revaccination 5 years after the first dose of PPSV23 is recommended for people aged 19-64 years who have chronic kidney failure, nephrotic syndrome, asplenia, or immunocompromised conditions. People who received 1-2 doses of PPSV23 before age 65 years should receive another dose of PPSV23 vaccine at age 65 years or later if at least 5 years have passed since the previous dose. Doses of PPSV23 are not needed for people immunized with PPSV23 at or after age 65 years.  Meningococcal vaccine. Adults with asplenia or persistent complement component deficiencies should receive 2 doses of quadrivalent meningococcal conjugate (MenACWY-D) vaccine. The doses should be obtained at least 2 months apart.  Microbiologists working with certain meningococcal bacteria, military recruits, people at risk during an outbreak, and people who travel to or live in countries with a high rate of meningitis should be immunized. A first-year college student up through age   21 years who is living in a residence hall should receive a dose if she did not receive a dose on or after her 16th birthday. Adults who have certain high-risk conditions should receive one or more doses of vaccine.  Hepatitis A vaccine. Adults who wish to be protected from this disease, have certain high-risk conditions, work with hepatitis A-infected animals, work in hepatitis A research labs, or travel to or work in countries with a high rate of hepatitis A should be immunized. Adults who were previously unvaccinated and who anticipate close contact with an international adoptee during the first 60 days after arrival in the [place] from a country with a high rate of hepatitis A should be immunized.  Hepatitis B vaccine. Adults who wish to be protected from this disease, have certain high-risk conditions, may be exposed to blood or other infectious body fluids, are household contacts or sex partners of hepatitis B positive people, are clients or workers in certain care facilities, or travel to or work in countries with a high rate of hepatitis B should be immunized.  Haemophilus influenzae type b (Hib) vaccine. A previously unvaccinated person with asplenia or sickle cell disease or having a scheduled splenectomy should receive 1 dose of Hib vaccine. Regardless of previous immunization, a recipient of a hematopoietic stem cell transplant should receive a 3-dose series 6-12 months after her successful transplant. Hib vaccine is not recommended for adults with HIV infection. Preventive Services / Frequency Ages 64 to 68 years  Blood pressure check.** / Every 1 to 2 years.  Lipid and cholesterol check.** / Every 5 years beginning at age  22.  Clinical breast exam.** / Every 3 years for women in their 88s and 53s.  BRCA-related cancer risk assessment.** / For women who have family members with a BRCA-related cancer (breast, ovarian, tubal, or peritoneal cancers).  Pap test.** / Every 2 years from ages 90 through 51. Every 3 years starting at age 21 through age 56 or 3 with a history of 3 consecutive normal Pap tests.  HPV screening.** / Every 3 years from ages 24 through ages 1 to 46 with a history of 3 consecutive normal Pap tests.  Hepatitis C blood test.** / For any individual with known risks for hepatitis C.  Skin self-exam. / Monthly.  Influenza vaccine. / Every year.  Tetanus, diphtheria, and acellular pertussis (Tdap, Td) vaccine.** / Consult your health care provider. Pregnant women should receive 1 dose of Tdap vaccine during each pregnancy. 1 dose of Td every 10 years.  Varicella vaccine.** / Consult your health care provider. Pregnant females who do not have evidence of immunity should receive the first dose after pregnancy.  HPV vaccine. / 3 doses over 6 months, if 72 and younger. The vaccine is not recommended for use in pregnant females. However, pregnancy testing is not needed before receiving a dose.  Measles, mumps, rubella (MMR) vaccine.** / You need at least 1 dose of MMR if you were born in 1957 or later. You may also need a 2nd dose. For females of childbearing age, rubella immunity should be determined. If there is no evidence of immunity, females who are not pregnant should be vaccinated. If there is no evidence of immunity, females who are pregnant should delay immunization until after pregnancy.  Pneumococcal 13-valent conjugate (PCV13) vaccine.** / Consult your health care provider.  Pneumococcal polysaccharide (PPSV23) vaccine.** / 1 to 2 doses if you smoke cigarettes or if you have certain conditions.  Meningococcal vaccine.** /  1 dose if you are age 19 to 21 years and a first-year college  student living in a residence hall, or have one of several medical conditions, you need to get vaccinated against meningococcal disease. You may also need additional booster doses.  Hepatitis A vaccine.** / Consult your health care provider.  Hepatitis B vaccine.** / Consult your health care provider.  Haemophilus influenzae type b (Hib) vaccine.** / Consult your health care provider. Ages 40 to 64 years  Blood pressure check.** / Every 1 to 2 years.  Lipid and cholesterol check.** / Every 5 years beginning at age 20 years.  Lung cancer screening. / Every year if you are aged 55-80 years and have a 30-pack-year history of smoking and currently smoke or have quit within the past 15 years. Yearly screening is stopped once you have quit smoking for at least 15 years or develop a health problem that would prevent you from having lung cancer treatment.  Clinical breast exam.** / Every year after age 40 years.  BRCA-related cancer risk assessment.** / For women who have family members with a BRCA-related cancer (breast, ovarian, tubal, or peritoneal cancers).  Mammogram.** / Every year beginning at age 40 years and continuing for as long as you are in good health. Consult with your health care provider.  Pap test.** / Every 3 years starting at age 30 years through age 65 or 70 years with a history of 3 consecutive normal Pap tests.  HPV screening.** / Every 3 years from ages 30 years through ages 65 to 70 years with a history of 3 consecutive normal Pap tests.  Fecal occult blood test (FOBT) of stool. / Every year beginning at age 50 years and continuing until age 75 years. You may not need to do this test if you get a colonoscopy every 10 years.  Flexible sigmoidoscopy or colonoscopy.** / Every 5 years for a flexible sigmoidoscopy or every 10 years for a colonoscopy beginning at age 50 years and continuing until age 75 years.  Hepatitis C blood test.** / For all people born from 1945 through  1965 and any individual with known risks for hepatitis C.  Skin self-exam. / Monthly.  Influenza vaccine. / Every year.  Tetanus, diphtheria, and acellular pertussis (Tdap/Td) vaccine.** / Consult your health care provider. Pregnant women should receive 1 dose of Tdap vaccine during each pregnancy. 1 dose of Td every 10 years.  Varicella vaccine.** / Consult your health care provider. Pregnant females who do not have evidence of immunity should receive the first dose after pregnancy.  Zoster vaccine.** / 1 dose for adults aged 60 years or older.  Measles, mumps, rubella (MMR) vaccine.** / You need at least 1 dose of MMR if you were born in 1957 or later. You may also need a 2nd dose. For females of childbearing age, rubella immunity should be determined. If there is no evidence of immunity, females who are not pregnant should be vaccinated. If there is no evidence of immunity, females who are pregnant should delay immunization until after pregnancy.  Pneumococcal 13-valent conjugate (PCV13) vaccine.** / Consult your health care provider.  Pneumococcal polysaccharide (PPSV23) vaccine.** / 1 to 2 doses if you smoke cigarettes or if you have certain conditions.  Meningococcal vaccine.** / Consult your health care provider.  Hepatitis A vaccine.** / Consult your health care provider.  Hepatitis B vaccine.** / Consult your health care provider.  Haemophilus influenzae type b (Hib) vaccine.** / Consult your health care provider. Ages 65   years and over  Blood pressure check.** / Every 1 to 2 years.  Lipid and cholesterol check.** / Every 5 years beginning at age 22 years.  Lung cancer screening. / Every year if you are aged 73-80 years and have a 30-pack-year history of smoking and currently smoke or have quit within the past 15 years. Yearly screening is stopped once you have quit smoking for at least 15 years or develop a health problem that would prevent you from having lung cancer  treatment.  Clinical breast exam.** / Every year after age 4 years.  BRCA-related cancer risk assessment.** / For women who have family members with a BRCA-related cancer (breast, ovarian, tubal, or peritoneal cancers).  Mammogram.** / Every year beginning at age 40 years and continuing for as long as you are in good health. Consult with your health care provider.  Pap test.** / Every 3 years starting at age 9 years through age 34 or 91 years with 3 consecutive normal Pap tests. Testing can be stopped between 65 and 70 years with 3 consecutive normal Pap tests and no abnormal Pap or HPV tests in the past 10 years.  HPV screening.** / Every 3 years from ages 57 years through ages 64 or 45 years with a history of 3 consecutive normal Pap tests. Testing can be stopped between 65 and 70 years with 3 consecutive normal Pap tests and no abnormal Pap or HPV tests in the past 10 years.  Fecal occult blood test (FOBT) of stool. / Every year beginning at age 15 years and continuing until age 17 years. You may not need to do this test if you get a colonoscopy every 10 years.  Flexible sigmoidoscopy or colonoscopy.** / Every 5 years for a flexible sigmoidoscopy or every 10 years for a colonoscopy beginning at age 86 years and continuing until age 71 years.  Hepatitis C blood test.** / For all people born from [birth year] through 1967 and any individual with known risks for hepatitis C.  Osteoporosis screening.** / A one-time screening for women ages 83 years and over and women at risk for fractures or osteoporosis.  Skin self-exam. / Monthly.  Influenza vaccine. / Every year.  Tetanus, diphtheria, and acellular pertussis (Tdap/Td) vaccine.** / 1 dose of Td every 10 years.  Varicella vaccine.** / Consult your health care provider.  Zoster vaccine.** / 1 dose for adults aged 61 years or older.  Pneumococcal 13-valent conjugate (PCV13) vaccine.** / Consult your health care provider.  Pneumococcal  polysaccharide (PPSV23) vaccine.** / 1 dose for all adults aged 28 years and older.  Meningococcal vaccine.** / Consult your health care provider.  Hepatitis A vaccine.** / Consult your health care provider.  Hepatitis B vaccine.** / Consult your health care provider.  Haemophilus influenzae type b (Hib) vaccine.** / Consult your health care provider. ** Family history and personal history of risk and conditions may change your health care provider's recommendations. Document Released: 02/28/2001 Document Revised: 05/19/2013 Document Reviewed: 05/30/2010 [facility] Patient Information 2015 [place], [place]. This information is not intended to replace advice given to you by your health care provider. Make sure you discuss any questions you have with your health care provider.

## 2014-09-22 NOTE — Progress Notes (Signed)
Pre visit review using our clinic review tool, if applicable. No additional management support is needed unless otherwise documented below in the visit note. 

## 2014-09-23 LAB — HIV ANTIBODY (ROUTINE TESTING W REFLEX): HIV: NONREACTIVE

## 2014-10-05 ENCOUNTER — Other Ambulatory Visit: Payer: Self-pay | Admitting: Family Medicine

## 2014-10-21 ENCOUNTER — Encounter: Payer: Self-pay | Admitting: Family Medicine

## 2014-10-23 NOTE — Telephone Encounter (Signed)
Sent email to [place] in billing to verify coverage for this visit, will follow up with patient when I have more information

## 2014-11-17 NOTE — Telephone Encounter (Signed)
Per email from [place], [place] 08/26/13 was submitted without the 25 modifier. This visit was sent for review and re-filed with insurance. Left message for patient to return my call so I can explain.

## 2015-02-05 MED FILL — ZOLPIDEM TARTRATE 5 MG TAB: 5 | 30 days supply | Qty: 30 | Fill #2

## 2015-03-10 MED FILL — PREVIDENT 5000 1.1% DRY MOU: 1.1 | 25 days supply | Qty: 100 | Fill #1

## 2015-03-10 MED FILL — ZOLPIDEM TARTRATE 5 MG TAB: 5 | 30 days supply | Qty: 30 | Fill #3

## 2015-03-22 ENCOUNTER — Ambulatory Visit: Payer: 59 | Admitting: Family Medicine

## 2015-04-02 ENCOUNTER — Ambulatory Visit (INDEPENDENT_AMBULATORY_CARE_PROVIDER_SITE_OTHER): Payer: 59 | Admitting: Family Medicine

## 2015-04-02 DIAGNOSIS — E785 Hyperlipidemia, unspecified: Secondary | ICD-10-CM

## 2015-04-02 LAB — COMPREHENSIVE METABOLIC PANEL
ALT: 22 U/L (ref 0–35)
AST: 23 U/L (ref 0–37)
Albumin: 4.3 g/dL (ref 3.5–5.2)
Alkaline Phosphatase: 60 U/L (ref 39–117)
BILIRUBIN TOTAL: 0.4 mg/dL (ref 0.2–1.2)
BUN: 14 mg/dL (ref 6–23)
CHLORIDE: 104 meq/L (ref 96–112)
CO2: 29 mEq/L (ref 19–32)
Calcium: 9.5 mg/dL (ref 8.4–10.5)
Creatinine, Ser: 0.59 mg/dL (ref 0.40–1.20)
GFR: 109.77 mL/min (ref >60.00)
GLUCOSE: 104 mg/dL — AB (ref 70–99)
POTASSIUM: 3.9 meq/L (ref 3.5–5.1)
SODIUM: 140 meq/L (ref 135–145)
TOTAL PROTEIN: 6.9 g/dL (ref 6.0–8.3)

## 2015-04-02 LAB — LIPID PANEL
Cholesterol: 161 mg/dL (ref 0–200)
HDL: 54 mg/dL (ref >39.00)
LDL Cholesterol: 82 mg/dL (ref 0–99)
NONHDL: 106.56
Total CHOL/HDL Ratio: 3
Triglycerides: 121 mg/dL (ref 0.0–149.0)
VLDL: 24.2 mg/dL (ref 0.0–40.0)

## 2015-04-02 NOTE — Progress Notes (Signed)
Pre visit review using our clinic review tool, if applicable. No additional management support is needed unless otherwise documented below in the visit note. 

## 2015-04-02 NOTE — Progress Notes (Signed)
Subjective:    Patient ID: Rachel Kline, female    DOB: 1955/11/26, 60 y.o.   MRN: [id number]  Chief Complaint  Patient presents with  . Hyperlipidemia    Patient refused her 6 mo follow up and only wanted labs    HPI Patient is in today for f/u lipid but refused ov---just wants labs done.  CMA explained importance of f/u but pt refuses-- just wants ov. Past Medical History  Diagnosis Date  . Hyperlipidemia   . Macular degeneration 07/02/2012  . Contact lens/glasses fitting     wears contacts or glasses    Past Surgical History  Procedure Laterality Date  . Ganglion cyst excision  6/99    rt wrist  . Colonoscopy    . Tonsillectomy N/A 11/22/2012    Procedure: TONSILLECTOMY;  Surgeon: Rozetta Nunnery, MD;  Location: [facility];  Service: ENT;  Laterality: N/A;    Family History  Problem Relation Age of Onset  . Coronary artery disease      female 1st degree relative <50  . Pancreatic cancer Mother   . Diabetes Mother   . Heart failure Father   . Kidney failure Father     end stage  . Heart disease Father     chf, cabg  . Hypertension Father   . Hypertension Brother   . Hyperlipidemia Brother     Social History   Social History  . Marital Status: Married    Spouse Name: N/A  . Number of Children: N/A  . 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 Smoking status: Never Smoker   . Smokeless tobacco: Not on file  . Alcohol Use: Yes     Comment: rare  . Drug Use: No  . Sexual Activity:    Partners: Male   Other Topics Concern  . Not on file   Social History Narrative   Exercise-- very little    Outpatient Prescriptions Prior to Visit  Medication Sig Dispense Refill  . aspirin 81 MG EC tablet Take 81 mg by mouth daily.     Marland Kitchen atorvastatin (LIPITOR) 20 MG tablet TAKE 1 TABLET BY MOUTH DAILY. 90 tablet 3  . Calcium-Vitamin  D-Vitamin K 500-500-40 MG-UNT-MCG CHEW 1 po tid 60 tablet   . cetirizine (ZYRTEC) 10 MG tablet Take 10 mg by mouth daily.      . Coenzyme Q10 (COQ10) 30 MG CAPS Take by mouth 2 (two) times daily.      . Multiple Vitamins-Iron (MULTIVITAMINS WITH IRON) TABS Take 1 tablet by mouth daily.  0  . Multiple Vitamins-Minerals (ICAPS AREDS 2) CAPS Take 2 capsules by mouth daily.    . Omega-3 350 MG CAPS Take by mouth.      . polyethylene glycol (MIRALAX) powder Take 17 g by mouth as needed.       No facility-administered medications prior to visit.    No Known Allergies  ROS     Objective:    Physical Exam  There were no vitals taken for this visit. Wt Readings from Last 3 Encounters:  09/22/14 170 lb 9.6 oz (77.384 kg)  08/26/13 170 lb 12.8 oz (77.474 kg)  11/18/12 164 lb (74.39 kg)     Lab Results  Component Value Date   WBC 5.6 09/22/2014   HGB 14.5 09/22/2014   HCT 42.8  09/22/2014   PLT 258.0 09/22/2014   GLUCOSE 105* 09/22/2014   CHOL 162 09/22/2014   TRIG 142.0 09/22/2014   HDL 56.50 09/22/2014   LDLDIRECT 149.7 03/31/2009   LDLCALC 77 09/22/2014   ALT 28 09/22/2014   AST 30 09/22/2014   NA 139 09/22/2014   K 4.4 09/22/2014   CL 102 09/22/2014   CREATININE 0.65 09/22/2014   BUN 11 09/22/2014   CO2 30 09/22/2014   TSH 2.16 09/22/2014   MICROALBUR 0.2 07/15/2012    Lab Results  Component Value Date   TSH 2.16 09/22/2014   Lab Results  Component Value Date   WBC 5.6 09/22/2014   HGB 14.5 09/22/2014   HCT 42.8 09/22/2014   MCV 93.5 09/22/2014   PLT 258.0 09/22/2014   Lab Results  Component Value Date   NA 139 09/22/2014   K 4.4 09/22/2014   CO2 30 09/22/2014   GLUCOSE 105* 09/22/2014   BUN 11 09/22/2014   CREATININE 0.65 09/22/2014   BILITOT 0.4 09/22/2014   ALKPHOS 64 09/22/2014   AST 30 09/22/2014   ALT 28 09/22/2014   PROT 7.1 09/22/2014   ALBUMIN 4.5 09/22/2014   CALCIUM 9.7 09/22/2014   GFR 98.34 09/22/2014   Lab Results  Component Value  Date   CHOL 162 09/22/2014   Lab Results  Component Value Date   HDL 56.50 09/22/2014   Lab Results  Component Value Date   LDLCALC 77 09/22/2014   Lab Results  Component Value Date   TRIG 142.0 09/22/2014   Lab Results  Component Value Date   CHOLHDL 3 09/22/2014   No results found for: HGBA1C     Assessment & Plan:   Problem List Items Addressed This Visit    None    Visit Diagnoses    Hyperlipidemia    -  Primary    Relevant Orders    Lipid panel    Comp Met (CMET)       I am having Ms. Billiter maintain her aspirin, cetirizine, CoQ10, polyethylene glycol powder, Omega-3, multivitamins with iron, Calcium-Vitamin D-Vitamin K, ICAPS AREDS 2, and atorvastatin.  No orders of the defined types were placed in this encounter.     Garnet Koyanagi, DO

## 2015-04-07 ENCOUNTER — Encounter: Payer: Self-pay | Admitting: Family Medicine

## 2015-04-07 MED FILL — ATORVASTATIN 20 MG TABLET: 20 | 90 days supply | Qty: 90 | Fill #2

## 2015-04-16 MED FILL — ZOLPIDEM TARTRATE 5 MG TAB: 5 | 30 days supply | Qty: 30 | Fill #0

## 2015-06-01 MED FILL — IBUPROFEN 600 MG TABLET: 600 | 4 days supply | Qty: 16 | Fill #0

## 2015-06-01 MED FILL — HYDROCODON-APAP 5-325: 5-325 | 3 days supply | Qty: 16 | Fill #0

## 2015-06-28 MED FILL — PREVIDENT 5000 1.1% DRY MOU: 1.1 | 25 days supply | Qty: 100 | Fill #2

## 2015-06-28 MED FILL — ZOLPIDEM TARTRATE 5 MG TAB: 5 | 30 days supply | Qty: 30 | Fill #1

## 2015-06-28 MED FILL — ATORVASTATIN 20 MG TABLET: 20 | 90 days supply | Qty: 90 | Fill #3

## 2015-07-26 MED FILL — ZOLPIDEM TARTRATE 5 MG TAB: 5 | 30 days supply | Qty: 30 | Fill #2

## 2015-09-01 MED FILL — ZOLPIDEM TARTRATE 5 MG TAB: 5 | 30 days supply | Qty: 30 | Fill #3

## 2015-09-08 ENCOUNTER — Other Ambulatory Visit: Payer: Self-pay | Admitting: Obstetrics and Gynecology

## 2015-09-08 DIAGNOSIS — Z6829 Body mass index (BMI) 29.0-29.9, adult: Secondary | ICD-10-CM | POA: Diagnosis not present

## 2015-09-08 DIAGNOSIS — Z01419 Encounter for gynecological examination (general) (routine) without abnormal findings: Secondary | ICD-10-CM | POA: Diagnosis not present

## 2015-09-08 DIAGNOSIS — Z1231 Encounter for screening mammogram for malignant neoplasm of breast: Secondary | ICD-10-CM | POA: Diagnosis not present

## 2015-09-08 DIAGNOSIS — Z124 Encounter for screening for malignant neoplasm of cervix: Secondary | ICD-10-CM | POA: Diagnosis not present

## 2015-09-09 LAB — CYTOLOGY - PAP

## 2015-09-14 ENCOUNTER — Other Ambulatory Visit: Payer: Self-pay | Admitting: Family Medicine

## 2015-09-14 MED FILL — ATORVASTATIN 20 MG TABLET: 20 | 90 days supply | Qty: 90 | Fill #0

## 2015-09-24 ENCOUNTER — Encounter: Payer: 59 | Admitting: Family Medicine

## 2015-10-18 MED FILL — ZOLPIDEM TARTRATE 5 MG TAB: 5 | 30 days supply | Qty: 30 | Fill #0

## 2015-10-19 MED FILL — PREVIDENT 5000 1.1% DRY MOU: 1.1 | 25 days supply | Qty: 100 | Fill #3

## 2015-11-12 ENCOUNTER — Encounter: Payer: Self-pay | Admitting: Family Medicine

## 2015-11-12 ENCOUNTER — Ambulatory Visit (INDEPENDENT_AMBULATORY_CARE_PROVIDER_SITE_OTHER): Payer: 59 | Admitting: Family Medicine

## 2015-11-12 VITALS — BP 140/80 | HR 68 | Temp 98.2°F | Resp 16 | Ht 64.0 in | Wt 177.6 lb

## 2015-11-12 DIAGNOSIS — E785 Hyperlipidemia, unspecified: Secondary | ICD-10-CM

## 2015-11-12 DIAGNOSIS — Z0000 Encounter for general adult medical examination without abnormal findings: Secondary | ICD-10-CM

## 2015-11-12 MED ORDER — ATORVASTATIN CALCIUM 20 MG PO TABS: 20.0000 mg | ORAL_TABLET | Freq: Every day | ORAL | 1 refills | Status: DC

## 2015-11-12 NOTE — Patient Instructions (Signed)
Preventive Care for Adults, Female A healthy lifestyle and preventive care can promote health and wellness. Preventive health guidelines for women include the following key practices.  A routine yearly physical is a good way to check with your health care provider about your health and preventive screening. It is a chance to share any concerns and updates on your health and to receive a thorough exam.  Visit your dentist for a routine exam and preventive care every 6 months. Brush your teeth twice a day and floss once a day. Good oral hygiene prevents tooth decay and gum disease.  The frequency of eye exams is based on your age, health, family medical history, use of contact lenses, and other factors. Follow your health care provider's recommendations for frequency of eye exams.  Eat a healthy diet. Foods like vegetables, fruits, whole grains, low-fat dairy products, and lean protein foods contain the nutrients you need without too many calories. Decrease your intake of foods high in solid fats, added sugars, and salt. Eat the right amount of calories for you.Get information about a proper diet from your health care provider, if necessary.  Regular physical exercise is one of the most important things you can do for your health. Most adults should get at least 150 minutes of moderate-intensity exercise (any activity that increases your heart rate and causes you to sweat) each week. In addition, most adults need muscle-strengthening exercises on 2 or more days a week.  Maintain a healthy weight. The body mass index (BMI) is a screening tool to identify possible weight problems. It provides an estimate of body fat based on height and weight. Your health care provider can find your BMI and can help you achieve or maintain a healthy weight.For adults 20 years and older:  A BMI below 18.5 is considered underweight.  A BMI of 18.5 to 24.9 is normal.  A BMI of 25 to 29.9 is considered overweight.  A  BMI of 30 and above is considered obese.  Maintain normal blood lipids and cholesterol levels by exercising and minimizing your intake of saturated fat. Eat a balanced diet with plenty of fruit and vegetables. Blood tests for lipids and cholesterol should begin at age 45 and be repeated every 5 years. If your lipid or cholesterol levels are high, you are over 50, or you are at high risk for heart disease, you may need your cholesterol levels checked more frequently.Ongoing high lipid and cholesterol levels should be treated with medicines if diet and exercise are not working.  If you smoke, find out from your health care provider how to quit. If you do not use tobacco, do not start.  Lung cancer screening is recommended for adults aged 45-80 years who are at high risk for developing lung cancer because of a history of smoking. A yearly low-dose CT scan of the lungs is recommended for people who have at least a 30-pack-year history of smoking and are a current smoker or have quit within the past 15 years. A pack year of smoking is smoking an average of 1 pack of cigarettes a day for 1 year (for example: 1 pack a day for 30 years or 2 packs a day for 15 years). Yearly screening should continue until the smoker has stopped smoking for at least 15 years. Yearly screening should be stopped for people who develop a health problem that would prevent them from having lung cancer treatment.  If you are pregnant, do not drink alcohol. If you are  breastfeeding, be very cautious about drinking alcohol. If you are not pregnant and choose to drink alcohol, do not have more than 1 drink per day. One drink is considered to be 12 ounces (355 mL) of beer, 5 ounces (148 mL) of wine, or 1.5 ounces (44 mL) of liquor.  Avoid use of street drugs. Do not share needles with anyone. Ask for help if you need support or instructions about stopping the use of drugs.  High blood pressure causes heart disease and increases the risk  of stroke. Your blood pressure should be checked at least every 1 to 2 years. Ongoing high blood pressure should be treated with medicines if weight loss and exercise do not work.  If you are 55-79 years old, ask your health care provider if you should take aspirin to prevent strokes.  Diabetes screening is done by taking a blood sample to check your blood glucose level after you have not eaten for a certain period of time (fasting). If you are not overweight and you do not have risk factors for diabetes, you should be screened once every 3 years starting at age 45. If you are overweight or obese and you are 40-70 years of age, you should be screened for diabetes every year as part of your cardiovascular risk assessment.  Breast cancer screening is essential preventive care for women. You should practice "breast self-awareness." This means understanding the normal appearance and feel of your breasts and may include breast self-examination. Any changes detected, no matter how small, should be reported to a health care provider. Women in their 20s and 30s should have a clinical breast exam (CBE) by a health care provider as part of a regular health exam every 1 to 3 years. After age 40, women should have a CBE every year. Starting at age 40, women should consider having a mammogram (breast X-ray test) every year. Women who have a family history of breast cancer should talk to their health care provider about genetic screening. Women at a high risk of breast cancer should talk to their health care providers about having an MRI and a mammogram every year.  Breast cancer gene (BRCA)-related cancer risk assessment is recommended for women who have family members with BRCA-related cancers. BRCA-related cancers include breast, ovarian, tubal, and peritoneal cancers. Having family members with these cancers may be associated with an increased risk for harmful changes (mutations) in the breast cancer genes BRCA1 and  BRCA2. Results of the assessment will determine the need for genetic counseling and BRCA1 and BRCA2 testing.  Your health care provider may recommend that you be screened regularly for cancer of the pelvic organs (ovaries, uterus, and vagina). This screening involves a pelvic examination, including checking for microscopic changes to the surface of your cervix (Pap test). You may be encouraged to have this screening done every 3 years, beginning at age 21.  For women ages 30-65, health care providers may recommend pelvic exams and Pap testing every 3 years, or they may recommend the Pap and pelvic exam, combined with testing for human papilloma virus (HPV), every 5 years. Some types of HPV increase your risk of cervical cancer. Testing for HPV may also be done on women of any age with unclear Pap test results.  Other health care providers may not recommend any screening for nonpregnant women who are considered low risk for pelvic cancer and who do not have symptoms. Ask your health care provider if a screening pelvic exam is right for   you.  If you have had past treatment for cervical cancer or a condition that could lead to cancer, you need Pap tests and screening for cancer for at least 20 years after your treatment. If Pap tests have been discontinued, your risk factors (such as having a new sexual partner) need to be reassessed to determine if screening should resume. Some women have medical problems that increase the chance of getting cervical cancer. In these cases, your health care provider may recommend more frequent screening and Pap tests.  Colorectal cancer can be detected and often prevented. Most routine colorectal cancer screening begins at the age of 50 years and continues through age 75 years. However, your health care provider may recommend screening at an earlier age if you have risk factors for colon cancer. On a yearly basis, your health care provider may provide home test kits to check  for hidden blood in the stool. Use of a small camera at the end of a tube, to directly examine the colon (sigmoidoscopy or colonoscopy), can detect the earliest forms of colorectal cancer. Talk to your health care provider about this at age 50, when routine screening begins. Direct exam of the colon should be repeated every 5-10 years through age 75 years, unless early forms of precancerous polyps or small growths are found.  People who are at an increased risk for hepatitis B should be screened for this virus. You are considered at high risk for hepatitis B if:  You were born in a country where hepatitis B occurs often. Talk with your health care provider about which countries are considered high risk.  Your parents were born in a high-risk country and you have not received a shot to protect against hepatitis B (hepatitis B vaccine).  You have HIV or AIDS.  You use needles to inject street drugs.  You live with, or have sex with, someone who has hepatitis B.  You get hemodialysis treatment.  You take certain medicines for conditions like cancer, organ transplantation, and autoimmune conditions.  Hepatitis C blood testing is recommended for all people born from 1945 through 1965 and any individual with known risks for hepatitis C.  Practice safe sex. Use condoms and avoid high-risk sexual practices to reduce the spread of sexually transmitted infections (STIs). STIs include gonorrhea, chlamydia, syphilis, trichomonas, herpes, HPV, and human immunodeficiency virus (HIV). Herpes, HIV, and HPV are viral illnesses that have no cure. They can result in disability, cancer, and death.  You should be screened for sexually transmitted illnesses (STIs) including gonorrhea and chlamydia if:  You are sexually active and are younger than 24 years.  You are older than 24 years and your health care provider tells you that you are at risk for this type of infection.  Your sexual activity has changed  since you were last screened and you are at an increased risk for chlamydia or gonorrhea. Ask your health care provider if you are at risk.  If you are at risk of being infected with HIV, it is recommended that you take a prescription medicine daily to prevent HIV infection. This is called preexposure prophylaxis (PrEP). You are considered at risk if:  You are sexually active and do not regularly use condoms or know the HIV status of your partner(s).  You take drugs by injection.  You are sexually active with a partner who has HIV.  Talk with your health care provider about whether you are at high risk of being infected with HIV. If   you choose to begin PrEP, you should first be tested for HIV. You should then be tested every 3 months for as long as you are taking PrEP.  Osteoporosis is a disease in which the bones lose minerals and strength with aging. This can result in serious bone fractures or breaks. The risk of osteoporosis can be identified using a bone density scan. Women ages 67 years and over and women at risk for fractures or osteoporosis should discuss screening with their health care providers. Ask your health care provider whether you should take a calcium supplement or vitamin D to reduce the rate of osteoporosis.  Menopause can be associated with physical symptoms and risks. Hormone replacement therapy is available to decrease symptoms and risks. You should talk to your health care provider about whether hormone replacement therapy is right for you.  Use sunscreen. Apply sunscreen liberally and repeatedly throughout the day. You should seek shade when your shadow is shorter than you. Protect yourself by wearing long sleeves, pants, a wide-brimmed hat, and sunglasses year round, whenever you are outdoors.  Once a month, do a whole body skin exam, using a mirror to look at the skin on your back. Tell your health care provider of new moles, moles that have irregular borders, moles that  are larger than a pencil eraser, or moles that have changed in shape or color.  Stay current with required vaccines (immunizations).  Influenza vaccine. All adults should be immunized every year.  Tetanus, diphtheria, and acellular pertussis (Td, Tdap) vaccine. Pregnant women should receive 1 dose of Tdap vaccine during each pregnancy. The dose should be obtained regardless of the length of time since the last dose. Immunization is preferred during the 27th-36th week of gestation. An adult who has not previously received Tdap or who does not know her vaccine status should receive 1 dose of Tdap. This initial dose should be followed by tetanus and diphtheria toxoids (Td) booster doses every 10 years. Adults with an unknown or incomplete history of completing a 3-dose immunization series with Td-containing vaccines should begin or complete a primary immunization series including a Tdap dose. Adults should receive a Td booster every 10 years.  Varicella vaccine. An adult without evidence of immunity to varicella should receive 2 doses or a second dose if she has previously received 1 dose. Pregnant females who do not have evidence of immunity should receive the first dose after pregnancy. This first dose should be obtained before leaving the health care facility. The second dose should be obtained 4-8 weeks after the first dose.  Human papillomavirus (HPV) vaccine. Females aged 13-26 years who have not received the vaccine previously should obtain the 3-dose series. The vaccine is not recommended for use in pregnant females. However, pregnancy testing is not needed before receiving a dose. If a female is found to be pregnant after receiving a dose, no treatment is needed. In that case, the remaining doses should be delayed until after the pregnancy. Immunization is recommended for any person with an immunocompromised condition through the age of 61 years if she did not get any or all doses earlier. During the  3-dose series, the second dose should be obtained 4-8 weeks after the first dose. The third dose should be obtained 24 weeks after the first dose and 16 weeks after the second dose.  Zoster vaccine. One dose is recommended for adults aged 30 years or older unless certain conditions are present.  Measles, mumps, and rubella (MMR) vaccine. Adults born  before 1957 generally are considered immune to measles and mumps. Adults born in 1957 or later should have 1 or more doses of MMR vaccine unless there is a contraindication to the vaccine or there is laboratory evidence of immunity to each of the three diseases. A routine second dose of MMR vaccine should be obtained at least 28 days after the first dose for students attending postsecondary schools, health care workers, or international travelers. People who received inactivated measles vaccine or an unknown type of measles vaccine during 1963-1967 should receive 2 doses of MMR vaccine. People who received inactivated mumps vaccine or an unknown type of mumps vaccine before 1979 and are at high risk for mumps infection should consider immunization with 2 doses of MMR vaccine. For females of childbearing age, rubella immunity should be determined. If there is no evidence of immunity, females who are not pregnant should be vaccinated. If there is no evidence of immunity, females who are pregnant should delay immunization until after pregnancy. Unvaccinated health care workers born before 1957 who lack laboratory evidence of measles, mumps, or rubella immunity or laboratory confirmation of disease should consider measles and mumps immunization with 2 doses of MMR vaccine or rubella immunization with 1 dose of MMR vaccine.  Pneumococcal 13-valent conjugate (PCV13) vaccine. When indicated, a person who is uncertain of his immunization history and has no record of immunization should receive the PCV13 vaccine. All adults 65 years of age and older should receive this  vaccine. An adult aged 19 years or older who has certain medical conditions and has not been previously immunized should receive 1 dose of PCV13 vaccine. This PCV13 should be followed with a dose of pneumococcal polysaccharide (PPSV23) vaccine. Adults who are at high risk for pneumococcal disease should obtain the PPSV23 vaccine at least 8 weeks after the dose of PCV13 vaccine. Adults older than 62 years of age who have normal immune system function should obtain the PPSV23 vaccine dose at least 1 year after the dose of PCV13 vaccine.  Pneumococcal polysaccharide (PPSV23) vaccine. When PCV13 is also indicated, PCV13 should be obtained first. All adults aged 65 years and older should be immunized. An adult younger than age 65 years who has certain medical conditions should be immunized. Any person who resides in a nursing home or long-term care facility should be immunized. An adult smoker should be immunized. People with an immunocompromised condition and certain other conditions should receive both PCV13 and PPSV23 vaccines. People with human immunodeficiency virus (HIV) infection should be immunized as soon as possible after diagnosis. Immunization during chemotherapy or radiation therapy should be avoided. Routine use of PPSV23 vaccine is not recommended for American Indians, [place] Natives, or people younger than 65 years unless there are medical conditions that require PPSV23 vaccine. When indicated, people who have unknown immunization and have no record of immunization should receive PPSV23 vaccine. One-time revaccination 5 years after the first dose of PPSV23 is recommended for people aged 19-64 years who have chronic kidney failure, nephrotic syndrome, asplenia, or immunocompromised conditions. People who received 1-2 doses of PPSV23 before age 65 years should receive another dose of PPSV23 vaccine at age 65 years or later if at least 5 years have passed since the previous dose. Doses of PPSV23 are not  needed for people immunized with PPSV23 at or after age 65 years.  Meningococcal vaccine. Adults with asplenia or persistent complement component deficiencies should receive 2 doses of quadrivalent meningococcal conjugate (MenACWY-D) vaccine. The doses should be obtained   at least 2 months apart. Microbiologists working with certain meningococcal bacteria, [place] recruits, people at risk during an outbreak, and people who travel to or live in countries with a high rate of meningitis should be immunized. A first-year college student up through age 34 years who is living in a residence hall should receive a dose if she did not receive a dose on or after her 16th birthday. Adults who have certain high-risk conditions should receive one or more doses of vaccine.  Hepatitis A vaccine. Adults who wish to be protected from this disease, have certain high-risk conditions, work with hepatitis A-infected animals, work in hepatitis A research labs, or travel to or work in countries with a high rate of hepatitis A should be immunized. Adults who were previously unvaccinated and who anticipate close contact with an international adoptee during the first 60 days after arrival in the [place] from a country with a high rate of hepatitis A should be immunized.  Hepatitis B vaccine. Adults who wish to be protected from this disease, have certain high-risk conditions, may be exposed to blood or other infectious body fluids, are household contacts or sex partners of hepatitis B positive people, are clients or workers in certain care facilities, or travel to or work in countries with a high rate of hepatitis B should be immunized.  Haemophilus influenzae type b (Hib) vaccine. A previously unvaccinated person with asplenia or sickle cell disease or having a scheduled splenectomy should receive 1 dose of Hib vaccine. Regardless of previous immunization, a recipient of a hematopoietic stem cell transplant should receive a  3-dose series 6-12 months after her successful transplant. Hib vaccine is not recommended for adults with HIV infection. Preventive Services / Frequency Ages 35 to 4 years  Blood pressure check.** / Every 3-5 years.  Lipid and cholesterol check.** / Every 5 years beginning at age 60.  Clinical breast exam.** / Every 3 years for women in their 71s and 10s.  BRCA-related cancer risk assessment.** / For women who have family members with a BRCA-related cancer (breast, ovarian, tubal, or peritoneal cancers).  Pap test.** / Every 2 years from ages 76 through 26. Every 3 years starting at age 61 through age 76 or 93 with a history of 3 consecutive normal Pap tests.  HPV screening.** / Every 3 years from ages 37 through ages 60 to 51 with a history of 3 consecutive normal Pap tests.  Hepatitis C blood test.** / For any individual with known risks for hepatitis C.  Skin self-exam. / Monthly.  Influenza vaccine. / Every year.  Tetanus, diphtheria, and acellular pertussis (Tdap, Td) vaccine.** / Consult your health care provider. Pregnant women should receive 1 dose of Tdap vaccine during each pregnancy. 1 dose of Td every 10 years.  Varicella vaccine.** / Consult your health care provider. Pregnant females who do not have evidence of immunity should receive the first dose after pregnancy.  HPV vaccine. / 3 doses over 6 months, if 93 and younger. The vaccine is not recommended for use in pregnant females. However, pregnancy testing is not needed before receiving a dose.  Measles, mumps, rubella (MMR) vaccine.** / You need at least 1 dose of MMR if you were born in 1957 or later. You may also need a 2nd dose. For females of childbearing age, rubella immunity should be determined. If there is no evidence of immunity, females who are not pregnant should be vaccinated. If there is no evidence of immunity, females who are  pregnant should delay immunization until after pregnancy.  Pneumococcal  13-valent conjugate (PCV13) vaccine.** / Consult your health care provider.  Pneumococcal polysaccharide (PPSV23) vaccine.** / 1 to 2 doses if you smoke cigarettes or if you have certain conditions.  Meningococcal vaccine.** / 1 dose if you are age 68 to 8 years and a Market researcher living in a residence hall, or have one of several medical conditions, you need to get vaccinated against meningococcal disease. You may also need additional booster doses.  Hepatitis A vaccine.** / Consult your health care provider.  Hepatitis B vaccine.** / Consult your health care provider.  Haemophilus influenzae type b (Hib) vaccine.** / Consult your health care provider. Ages 7 to 53 years  Blood pressure check.** / Every year.  Lipid and cholesterol check.** / Every 5 years beginning at age 25 years.  Lung cancer screening. / Every year if you are aged 11-80 years and have a 30-pack-year history of smoking and currently smoke or have quit within the past 15 years. Yearly screening is stopped once you have quit smoking for at least 15 years or develop a health problem that would prevent you from having lung cancer treatment.  Clinical breast exam.** / Every year after age 48 years.  BRCA-related cancer risk assessment.** / For women who have family members with a BRCA-related cancer (breast, ovarian, tubal, or peritoneal cancers).  Mammogram.** / Every year beginning at age 41 years and continuing for as long as you are in good health. Consult with your health care provider.  Pap test.** / Every 3 years starting at age 65 years through age 37 or 70 years with a history of 3 consecutive normal Pap tests.  HPV screening.** / Every 3 years from ages 72 years through ages 60 to 40 years with a history of 3 consecutive normal Pap tests.  Fecal occult blood test (FOBT) of stool. / Every year beginning at age 21 years and continuing until age 5 years. You may not need to do this test if you get  a colonoscopy every 10 years.  Flexible sigmoidoscopy or colonoscopy.** / Every 5 years for a flexible sigmoidoscopy or every 10 years for a colonoscopy beginning at age 35 years and continuing until age 48 years.  Hepatitis C blood test.** / For all people born from [birth year] through 1965 and any individual with known risks for hepatitis C.  Skin self-exam. / Monthly.  Influenza vaccine. / Every year.  Tetanus, diphtheria, and acellular pertussis (Tdap/Td) vaccine.** / Consult your health care provider. Pregnant women should receive 1 dose of Tdap vaccine during each pregnancy. 1 dose of Td every 10 years.  Varicella vaccine.** / Consult your health care provider. Pregnant females who do not have evidence of immunity should receive the first dose after pregnancy.  Zoster vaccine.** / 1 dose for adults aged 30 years or older.  Measles, mumps, rubella (MMR) vaccine.** / You need at least 1 dose of MMR if you were born in 1957 or later. You may also need a second dose. For females of childbearing age, rubella immunity should be determined. If there is no evidence of immunity, females who are not pregnant should be vaccinated. If there is no evidence of immunity, females who are pregnant should delay immunization until after pregnancy.  Pneumococcal 13-valent conjugate (PCV13) vaccine.** / Consult your health care provider.  Pneumococcal polysaccharide (PPSV23) vaccine.** / 1 to 2 doses if you smoke cigarettes or if you have certain conditions.  Meningococcal vaccine.** /  Consult your health care provider.  Hepatitis A vaccine.** / Consult your health care provider.  Hepatitis B vaccine.** / Consult your health care provider.  Haemophilus influenzae type b (Hib) vaccine.** / Consult your health care provider. Ages 64 years and over  Blood pressure check.** / Every year.  Lipid and cholesterol check.** / Every 5 years beginning at age 23 years.  Lung cancer screening. / Every year if you  are aged 16-80 years and have a 30-pack-year history of smoking and currently smoke or have quit within the past 15 years. Yearly screening is stopped once you have quit smoking for at least 15 years or develop a health problem that would prevent you from having lung cancer treatment.  Clinical breast exam.** / Every year after age 74 years.  BRCA-related cancer risk assessment.** / For women who have family members with a BRCA-related cancer (breast, ovarian, tubal, or peritoneal cancers).  Mammogram.** / Every year beginning at age 44 years and continuing for as long as you are in good health. Consult with your health care provider.  Pap test.** / Every 3 years starting at age 58 years through age 22 or 39 years with 3 consecutive normal Pap tests. Testing can be stopped between 65 and 70 years with 3 consecutive normal Pap tests and no abnormal Pap or HPV tests in the past 10 years.  HPV screening.** / Every 3 years from ages 64 years through ages 70 or 61 years with a history of 3 consecutive normal Pap tests. Testing can be stopped between 65 and 70 years with 3 consecutive normal Pap tests and no abnormal Pap or HPV tests in the past 10 years.  Fecal occult blood test (FOBT) of stool. / Every year beginning at age 40 years and continuing until age 27 years. You may not need to do this test if you get a colonoscopy every 10 years.  Flexible sigmoidoscopy or colonoscopy.** / Every 5 years for a flexible sigmoidoscopy or every 10 years for a colonoscopy beginning at age 7 years and continuing until age 32 years.  Hepatitis C blood test.** / For all people born from [birth year] through 1967 and any individual with known risks for hepatitis C.  Osteoporosis screening.** / A one-time screening for women ages 30 years and over and women at risk for fractures or osteoporosis.  Skin self-exam. / Monthly.  Influenza vaccine. / Every year.  Tetanus, diphtheria, and acellular pertussis (Tdap/Td)  vaccine.** / 1 dose of Td every 10 years.  Varicella vaccine.** / Consult your health care provider.  Zoster vaccine.** / 1 dose for adults aged 35 years or older.  Pneumococcal 13-valent conjugate (PCV13) vaccine.** / Consult your health care provider.  Pneumococcal polysaccharide (PPSV23) vaccine.** / 1 dose for all adults aged 46 years and older.  Meningococcal vaccine.** / Consult your health care provider.  Hepatitis A vaccine.** / Consult your health care provider.  Hepatitis B vaccine.** / Consult your health care provider.  Haemophilus influenzae type b (Hib) vaccine.** / Consult your health care provider. ** Family history and personal history of risk and conditions may change your health care provider's recommendations.   This information is not intended to replace advice given to you by your health care provider. Make sure you discuss any questions you have with your health care provider.   Document Released: 02/28/2001 Document Revised: 01/23/2014 Document Reviewed: 05/30/2010 Elsevier Interactive Patient Education [employer].

## 2015-11-12 NOTE — Progress Notes (Signed)
Subjective:     Rachel Kline is a 60 y.o. female and is here for a comprehensive physical exam. The patient reports problems - trouble losing weight.  Social History   Social History  . Marital status: Married    Spouse name: N/A  . Number of children: N/A  . Years of education: N/A   Occupational History  . [employer]  . 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 Smoking status: Never Smoker  . Smokeless tobacco: Not on file  . Alcohol use Yes     Comment: rare  . Drug use: No  . Sexual activity: Yes    Partners: Male   Other Topics Concern  . Not on file   Social History Narrative   Exercise-- very little   Health Maintenance  Topic Date Due  . TETANUS/TDAP  02/22/2016  . MAMMOGRAM  08/03/2016  . PAP SMEAR  09/08/2018  . COLONOSCOPY  06/20/2022  . INFLUENZA VACCINE  Completed  . ZOSTAVAX  Completed  . Hepatitis C Screening  Completed  . HIV Screening  Completed    The following portions of the patient's history were reviewed and updated as appropriate:  She  has a past medical history of Contact lens/glasses fitting; Hyperlipidemia; and Macular degeneration (07/02/2012). She  does not have any pertinent problems on file. She  has a past surgical history that includes Ganglion cyst excision (6/99); Colonoscopy; and Tonsillectomy (N/A, 11/22/2012). Her family history includes Diabetes in her mother; Heart disease in her father; Heart failure in her father; Hyperlipidemia in her brother; Hypertension in her brother and father; Kidney failure in her father; Pancreatic cancer in her mother. She  reports that she has never smoked. She does not have any smokeless tobacco history on file. She reports that she drinks alcohol. She reports that she does not use drugs. She has a current medication list which includes the following prescription(s): aspirin, atorvastatin, calcium-vitamin d-vitamin k, cetirizine,  coq10, multivitamins with iron, icaps areds 2, omega-3, and polyethylene glycol powder. Current Outpatient Prescriptions on File Prior to Visit  Medication Sig Dispense Refill  . aspirin 81 MG EC tablet Take 81 mg by mouth daily.     . Calcium-Vitamin D-Vitamin K 500-500-40 MG-UNT-MCG CHEW 1 po tid 60 tablet   . cetirizine (ZYRTEC) 10 MG tablet Take 10 mg by mouth daily.      . Coenzyme Q10 (COQ10) 30 MG CAPS Take by mouth 2 (two) times daily.      . Multiple Vitamins-Iron (MULTIVITAMINS WITH IRON) TABS Take 1 tablet by mouth daily.  0  . Multiple Vitamins-Minerals (ICAPS AREDS 2) CAPS Take 2 capsules by mouth daily.    . Omega-3 350 MG CAPS Take by mouth.      . polyethylene glycol (MIRALAX) powder Take 17 g by mouth as needed.       No current facility-administered medications on file prior to visit.    She has No Known Allergies..  Review of Systems Review of Systems  Constitutional: Negative for activity change, appetite change and fatigue.  HENT: Negative for hearing loss, congestion, tinnitus and ear discharge.  dentist q27m Eyes: Negative for visual disturbance (see optho q1y -- vision corrected to 20/20 with glasses).  Respiratory: Negative for cough, chest tightness and shortness of breath.   Cardiovascular: Negative for chest pain, palpitations and leg swelling.  Gastrointestinal: Negative for abdominal pain, diarrhea, constipation and abdominal distention.  Genitourinary: Negative for urgency, frequency, decreased urine volume and difficulty urinating.  Musculoskeletal: Negative for back pain, arthralgias and gait problem.  Skin: Negative for color change, pallor and rash.  Neurological: Negative for dizziness, light-headedness, numbness and headaches.  Hematological: Negative for adenopathy. Does not bruise/bleed easily.  Psychiatric/Behavioral: Negative for suicidal ideas, confusion, sleep disturbance, self-injury, dysphoric mood, decreased concentration and agitation.        Objective:    BP 140/80 (BP Location: Left Arm, Patient Position: Sitting, Cuff Size: Normal)   Pulse 68   Temp 98.2 F (36.8 C) (Oral)   Resp 16   Ht 5\' 4"  (1.626 m)   Wt 177 lb 9.6 oz (80.6 kg)   SpO2 99%   BMI 30.48 kg/m  General appearance: alert, cooperative, appears stated age and no distress Head: Normocephalic, without obvious abnormality, atraumatic Eyes: conjunctivae/corneas clear. PERRL, EOM's intact. Fundi benign. Ears: normal TM's and external ear canals both ears Nose: Nares normal. Septum midline. Mucosa normal. No drainage or sinus tenderness. Throat: lips, mucosa, and tongue normal; teeth and gums normal Neck: no adenopathy, no carotid bruit, no JVD, supple, symmetrical, trachea midline and thyroid not enlarged, symmetric, no tenderness/mass/nodules Back: symmetric, no curvature. ROM normal. No CVA tenderness. Lungs: clear to auscultation bilaterally Breasts: normal appearance, no masses or tenderness, gyn Heart: regular rate and rhythm, S1, S2 normal, no murmur, click, rub or gallop Abdomen: soft, non-tender; bowel sounds normal; no masses,  no organomegaly Pelvic: deferred --gyn Extremities: extremities normal, atraumatic, no cyanosis or edema Pulses: 2+ and symmetric Skin: Skin color, texture, turgor normal. No rashes or lesions Lymph nodes: Cervical, supraclavicular, and axillary nodes normal. Neurologic: Alert and oriented X 3, normal strength and tone. Normal symmetric reflexes. Normal coordination and gait    Assessment:    Healthy female exam.      Plan:    check labs ghm utd  See After Visit Summary for Counseling Recommendations    1. Hyperlipidemia LDL goal <100   - atorvastatin (LIPITOR) 20 MG tablet; Take 1 tablet (20 mg total) by mouth daily.  Dispense: 90 tablet; Refill: 1 - Lipid panel; Future - Comprehensive metabolic panel; Future  2. Preventative health care See above - Lipid panel; Future - CBC with  Differential/Platelet; Future - Comprehensive metabolic panel; Future - TSH; Future - POCT urinalysis dipstick; Future

## 2015-11-12 NOTE — Progress Notes (Signed)
Pre visit review using our clinic review tool, if applicable. No additional management support is needed unless otherwise documented below in the visit note. 

## 2015-11-16 ENCOUNTER — Other Ambulatory Visit (INDEPENDENT_AMBULATORY_CARE_PROVIDER_SITE_OTHER): Payer: 59

## 2015-11-16 DIAGNOSIS — E039 Hypothyroidism, unspecified: Secondary | ICD-10-CM

## 2015-11-16 DIAGNOSIS — E785 Hyperlipidemia, unspecified: Secondary | ICD-10-CM

## 2015-11-16 DIAGNOSIS — Z0000 Encounter for general adult medical examination without abnormal findings: Secondary | ICD-10-CM

## 2015-11-16 LAB — CBC WITH DIFFERENTIAL/PLATELET
BASOS ABS: 0 10*3/uL (ref 0.0–0.1)
Basophils Relative: 0.8 % (ref 0.0–3.0)
EOS ABS: 0.2 10*3/uL (ref 0.0–0.7)
Eosinophils Relative: 4.2 % (ref 0.0–5.0)
HEMATOCRIT: 44 % (ref 36.0–46.0)
HEMOGLOBIN: 15.2 g/dL — AB (ref 12.0–15.0)
LYMPHS PCT: 32.1 % (ref 12.0–46.0)
Lymphs Abs: 1.6 10*3/uL (ref 0.7–4.0)
MCHC: 34.5 g/dL (ref 30.0–36.0)
MCV: 92.8 fl (ref 78.0–100.0)
MONOS PCT: 8.1 % (ref 3.0–12.0)
Monocytes Absolute: 0.4 10*3/uL (ref 0.1–1.0)
NEUTROS ABS: 2.8 10*3/uL (ref 1.4–7.7)
NEUTROS PCT: 54.8 % (ref 43.0–77.0)
PLATELETS: 267 10*3/uL (ref 150.0–400.0)
RBC: 4.74 Mil/uL (ref 3.87–5.11)
RDW: 12.7 % (ref 11.5–15.5)
WBC: 5.1 10*3/uL (ref 4.0–10.5)

## 2015-11-16 LAB — LIPID PANEL
CHOL/HDL RATIO: 3
Cholesterol: 163 mg/dL (ref 0–200)
HDL: 52.7 mg/dL (ref >39.00)
LDL CALC: 80 mg/dL (ref 0–99)
NONHDL: 110.3
TRIGLYCERIDES: 150 mg/dL — AB (ref 0.0–149.0)
VLDL: 30 mg/dL (ref 0.0–40.0)

## 2015-11-16 LAB — COMPREHENSIVE METABOLIC PANEL
ALBUMIN: 4.6 g/dL (ref 3.5–5.2)
ALT: 26 U/L (ref 0–35)
AST: 26 U/L (ref 0–37)
Alkaline Phosphatase: 63 U/L (ref 39–117)
BUN: 12 mg/dL (ref 6–23)
CHLORIDE: 104 meq/L (ref 96–112)
CO2: 26 mEq/L (ref 19–32)
Calcium: 9.9 mg/dL (ref 8.4–10.5)
Creatinine, Ser: 0.67 mg/dL (ref 0.40–1.20)
GFR: 94.6 mL/min (ref >60.00)
GLUCOSE: 96 mg/dL (ref 70–99)
POTASSIUM: 4.3 meq/L (ref 3.5–5.1)
SODIUM: 140 meq/L (ref 135–145)
Total Bilirubin: 0.6 mg/dL (ref 0.2–1.2)
Total Protein: 7.2 g/dL (ref 6.0–8.3)

## 2015-11-16 LAB — TSH: TSH: 6.95 u[IU]/mL — AB (ref 0.35–4.50)

## 2015-11-19 MED FILL — ZOLPIDEM TARTRATE 5 MG TAB: 5 | 30 days supply | Qty: 30 | Fill #1

## 2015-12-01 ENCOUNTER — Other Ambulatory Visit (INDEPENDENT_AMBULATORY_CARE_PROVIDER_SITE_OTHER): Payer: 59

## 2015-12-01 DIAGNOSIS — E039 Hypothyroidism, unspecified: Secondary | ICD-10-CM | POA: Diagnosis not present

## 2015-12-01 LAB — TSH: TSH: 6.21 u[IU]/mL — ABNORMAL HIGH (ref 0.35–4.50)

## 2015-12-01 LAB — T3, FREE: T3, Free: 3.3 pg/mL (ref 2.3–4.2)

## 2015-12-01 LAB — T4, FREE: FREE T4: 0.52 ng/dL — AB (ref 0.60–1.60)

## 2015-12-06 ENCOUNTER — Encounter: Payer: Self-pay | Admitting: Family Medicine

## 2015-12-06 ENCOUNTER — Telehealth: Payer: Self-pay

## 2015-12-06 ENCOUNTER — Telehealth: Payer: Self-pay | Admitting: Family Medicine

## 2015-12-06 DIAGNOSIS — E0590 Thyrotoxicosis, unspecified without thyrotoxic crisis or storm: Secondary | ICD-10-CM

## 2015-12-06 DIAGNOSIS — E038 Other specified hypothyroidism: Secondary | ICD-10-CM

## 2015-12-06 MED ORDER — LEVOTHYROXINE SODIUM 50 MCG PO TABS
50.0000 ug | ORAL_TABLET | Freq: Every day | ORAL | 3 refills | Status: DC
Start: 2015-12-06 — End: 2016-11-13

## 2015-12-06 MED FILL — LEVOTHYROXINE 50 MCG TABLET: 50 | 90 days supply | Qty: 90 | Fill #0

## 2015-12-06 NOTE — Telephone Encounter (Signed)
Medication has already been filled to pharmacy as requested by LPN.

## 2015-12-06 NOTE — Telephone Encounter (Signed)
Spoke with pt, pt states she understand her results, new medication instructions, and follow up appointment with lab work. Sent in new Rx. Future lab order entered. Patient had no further questions or concerns. LB

## 2015-12-06 NOTE — Telephone Encounter (Signed)
Caller name: Relationship to patient: Self Can be reached: [phone] Pharmacy:  Reason for call: Patient wants to know if a Rx needs to be called in for her. States she saw the lab results on My Chart but did not see where a Rx had been sent. Plse adv.

## 2015-12-21 MED FILL — ZOLPIDEM TARTRATE 5 MG TAB: 5 | 30 days supply | Qty: 30 | Fill #2

## 2015-12-27 DIAGNOSIS — H52203 Unspecified astigmatism, bilateral: Secondary | ICD-10-CM | POA: Diagnosis not present

## 2015-12-27 DIAGNOSIS — H524 Presbyopia: Secondary | ICD-10-CM | POA: Diagnosis not present

## 2016-01-03 MED FILL — ATORVASTATIN 20 MG TABLET: 20 | 90 days supply | Qty: 90 | Fill #1

## 2016-01-21 MED FILL — ZOLPIDEM TARTRATE 5 MG TAB: 5 | 30 days supply | Qty: 30 | Fill #3

## 2016-02-07 ENCOUNTER — Other Ambulatory Visit (INDEPENDENT_AMBULATORY_CARE_PROVIDER_SITE_OTHER): Payer: 59

## 2016-02-07 DIAGNOSIS — E038 Other specified hypothyroidism: Secondary | ICD-10-CM | POA: Diagnosis not present

## 2016-02-07 LAB — TSH: TSH: 2.12 u[IU]/mL (ref 0.35–4.50)

## 2016-02-11 MED FILL — PREVIDENT 5000 1.1% DRY MOU: 1.1 | 30 days supply | Qty: 100 | Fill #0

## 2016-02-24 MED FILL — ZOLPIDEM TARTRATE 5 MG TAB: 5 | 30 days supply | Qty: 30 | Fill #0

## 2016-03-02 MED FILL — LEVOTHYROXINE 50 MCG TABLET: 50 | 90 days supply | Qty: 90 | Fill #1

## 2016-03-09 ENCOUNTER — Ambulatory Visit (INDEPENDENT_AMBULATORY_CARE_PROVIDER_SITE_OTHER): Payer: 59 | Admitting: Family Medicine

## 2016-03-09 ENCOUNTER — Encounter: Payer: Self-pay | Admitting: Family Medicine

## 2016-03-09 DIAGNOSIS — R21 Rash and other nonspecific skin eruption: Secondary | ICD-10-CM | POA: Diagnosis not present

## 2016-03-09 MED ORDER — NYSTATIN 100000 UNIT/GM EX POWD: Freq: Four times a day (QID) | CUTANEOUS | 0 refills | Status: DC

## 2016-03-09 MED ORDER — PREDNISONE 10 MG PO TABS: ORAL_TABLET | ORAL | 0 refills | Status: DC

## 2016-03-09 MED FILL — NYSTATIN 100,000 UNIT/GM PO: 100000 | 15 days supply | Qty: 30 | Fill #0

## 2016-03-09 MED FILL — predniSONE 10 MG TABS: 10 | 12 days supply | Qty: 20 | Fill #0

## 2016-03-09 NOTE — Assessment & Plan Note (Signed)
Under breasts and groin -- + candida ---  Nystatin powder and pred taper rto or call if symptoms do not improve pred should help hands and back-- also use moisturizing lotion for that

## 2016-03-09 NOTE — Progress Notes (Signed)
Pre visit review using our clinic review tool, if applicable. No additional management support is needed unless otherwise documented below in the visit note. 

## 2016-03-09 NOTE — Patient Instructions (Signed)
Skin Yeast Infection Skin yeast infection is a condition in which there is an overgrowth of yeast (candida) that normally lives on the skin. This condition usually occurs in areas of the skin that are constantly warm and moist, such as the armpits or the groin. What are the causes? This condition is caused by a change in the normal balance of the yeast and bacteria that live on the skin. What increases the risk? This condition is more likely to develop in:  People who are obese.  Pregnant women.  Women who take birth control pills.  People who have diabetes.  People who take antibiotic medicines.  People who take steroid medicines.  People who are malnourished.  People who have a weak defense (immune) system.  People who are 65 years of age or older. What are the signs or symptoms? Symptoms of this condition include:  A red, swollen area of the skin.  Bumps on the skin.  Itchiness. How is this diagnosed? This condition is diagnosed with a medical history and physical exam. Your health care provider may check for yeast by taking light scrapings of the skin to be viewed under a microscope. How is this treated? This condition is treated with medicine. Medicines may be prescribed or be available over-the-counter. The medicines may be:  Taken by mouth (orally).  Applied as a cream. Follow these instructions at home:  Take or apply over-the-counter and prescription medicines only as told by your health care provider.  Eat more yogurt. This may help to keep your yeast infection from returning.  Maintain a healthy weight. If you need help losing weight, talk with your health care provider.  Keep your skin clean and dry.  If you have diabetes, keep your blood sugar under control. Contact a health care provider if:  Your symptoms go away and then return.  Your symptoms do not get better with treatment.  Your symptoms get worse.  Your rash spreads.  You have a fever  or chills.  You have new symptoms.  You have new warmth or redness of your skin. This information is not intended to replace advice given to you by your health care provider. Make sure you discuss any questions you have with your health care provider. Document Released: 09/20/2010 Document Revised: 08/29/2015 Document Reviewed: 07/06/2014 Elsevier Interactive Patient Education  2017 Elsevier Inc.  

## 2016-03-09 NOTE — Progress Notes (Signed)
Subjective:    Patient ID: Rachel Kline, female    DOB: [birth year], 61 y.o.   MRN: [id number]   I acted as a Education administrator for Dr. Royden Purl, LPN   Chief Complaint  Patient presents with  . Rash    Pt c/o rash on both hands, under breast, groin area, and back since December and it is getting worst.    Rash  This is a new problem. The current episode started more than 1 month ago. The problem has been gradually worsening since onset. The affected locations include the groin, right hand and left hand. The rash is characterized by redness and itchiness. Pertinent negatives include no congestion, cough, fever or vomiting. Past treatments include anti-itch cream. The treatment provided no relief.    Patient is in today for c/o rash on both hands, under breast, groin area, and back since December and it is getting worse.   Past Medical History:  Diagnosis Date  . Contact lens/glasses fitting    wears contacts or glasses  . Hyperlipidemia   . Macular degeneration 07/02/2012    Past Surgical History:  Procedure Laterality Date  . COLONOSCOPY    . GANGLION CYST EXCISION  6/99   rt wrist  . TONSILLECTOMY N/A 11/22/2012   Procedure: TONSILLECTOMY;  Surgeon: Rozetta Nunnery, MD;  Location: [facility];  Service: ENT;  Laterality: N/A;    Family History  Problem Relation Age of Onset  . Coronary artery disease      female 1st degree relative <50  . Pancreatic cancer Mother   . Diabetes Mother   . Heart failure Father   . Kidney failure Father     end stage  . Heart disease Father     chf, cabg  . Hypertension Father   . Hypertension Brother   . Hyperlipidemia Brother     Social History   Social History  . Marital status: Married    Spouse name: N/A  . Number of children: N/A  . Years of education: N/A   Occupational History  . [employer]  . 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  Smoking status: Never Smoker  . Smokeless tobacco: Never Used  . Alcohol use Yes     Comment: rare  . Drug use: No  . Sexual activity: Yes    Partners: Male   Other Topics Concern  . Not on file   Social History Narrative   Exercise-- very little    Outpatient Medications Prior to Visit  Medication Sig Dispense Refill  . aspirin 81 MG EC tablet Take 81 mg by mouth daily.     Marland Kitchen atorvastatin (LIPITOR) 20 MG tablet Take 1 tablet (20 mg total) by mouth daily. 90 tablet 1  . Calcium-Vitamin D-Vitamin K 500-500-40 MG-UNT-MCG CHEW 1 po tid 60 tablet   . cetirizine (ZYRTEC) 10 MG tablet Take 10 mg by mouth daily.      . Coenzyme Q10 (COQ10) 30 MG CAPS Take by mouth 2 (two) times daily.      Marland Kitchen levothyroxine (SYNTHROID, LEVOTHROID) 50 MCG tablet Take 1 tablet (50 mcg total) by mouth daily. 90 tablet 3  . Multiple Vitamins-Iron (MULTIVITAMINS WITH IRON) TABS Take 1 tablet by mouth daily.  0  . Multiple Vitamins-Minerals (ICAPS AREDS 2) CAPS Take 2 capsules by mouth daily.    . Omega-3 350 MG CAPS Take by mouth.      Marland Kitchen  polyethylene glycol (MIRALAX) powder Take 17 g by mouth as needed.       No facility-administered medications prior to visit.     No Known Allergies  Review of Systems  Constitutional: Negative for fever.  HENT: Negative for congestion.   Eyes: Negative for blurred vision.  Respiratory: Negative for cough.   Cardiovascular: Negative for chest pain and palpitations.  Gastrointestinal: Negative for vomiting.  Musculoskeletal: Negative for back pain.  Skin: Positive for rash.  Neurological: Negative for loss of consciousness and headaches.       Objective:    Physical Exam  Constitutional: She is oriented to person, place, and time. She appears well-developed and well-nourished. No distress.  HENT:  Head: Normocephalic and atraumatic.  Eyes: Conjunctivae are normal. Pupils are equal, round, and reactive to light.  Neck: Normal range of motion. No thyromegaly  present.  Cardiovascular: Normal rate and regular rhythm.   Pulmonary/Chest: Effort normal and breath sounds normal. She has no wheezes.  Abdominal: Soft. Bowel sounds are normal. There is no tenderness.  Musculoskeletal: Normal range of motion. She exhibits no edema or deformity.  Neurological: She is alert and oriented to person, place, and time.  Skin: Skin is warm and dry. Rash noted. She is not diaphoretic.     Psychiatric: She has a normal mood and affect.    BP 130/70 (BP Location: Left Arm, Patient Position: Sitting, Cuff Size: Normal)   Pulse 78   Temp 98.2 F (36.8 C) (Oral)   Resp 16   Ht 5\' 4"  (1.626 m)   Wt 175 lb (79.4 kg)   SpO2 95%   BMI 30.04 kg/m  Wt Readings from Last 3 Encounters:  03/09/16 175 lb (79.4 kg)  11/12/15 177 lb 9.6 oz (80.6 kg)  09/22/14 170 lb 9.6 oz (77.4 kg)     Lab Results  Component Value Date   WBC 5.1 11/16/2015   HGB 15.2 (H) 11/16/2015   HCT 44.0 11/16/2015   PLT 267.0 11/16/2015   GLUCOSE 96 11/16/2015   CHOL 163 11/16/2015   TRIG 150.0 (H) 11/16/2015   HDL 52.70 11/16/2015   LDLDIRECT 149.7 03/31/2009   LDLCALC 80 11/16/2015   ALT 26 11/16/2015   AST 26 11/16/2015   NA 140 11/16/2015   K 4.3 11/16/2015   CL 104 11/16/2015   CREATININE 0.67 11/16/2015   BUN 12 11/16/2015   CO2 26 11/16/2015   TSH 2.12 02/07/2016   MICROALBUR 0.2 07/15/2012    Lab Results  Component Value Date   TSH 2.12 02/07/2016   Lab Results  Component Value Date   WBC 5.1 11/16/2015   HGB 15.2 (H) 11/16/2015   HCT 44.0 11/16/2015   MCV 92.8 11/16/2015   PLT 267.0 11/16/2015   Lab Results  Component Value Date   NA 140 11/16/2015   K 4.3 11/16/2015   CO2 26 11/16/2015   GLUCOSE 96 11/16/2015   BUN 12 11/16/2015   CREATININE 0.67 11/16/2015   BILITOT 0.6 11/16/2015   ALKPHOS 63 11/16/2015   AST 26 11/16/2015   ALT 26 11/16/2015   PROT 7.2 11/16/2015   ALBUMIN 4.6 11/16/2015   CALCIUM 9.9 11/16/2015   GFR 94.60 11/16/2015    Lab Results  Component Value Date   CHOL 163 11/16/2015   Lab Results  Component Value Date   HDL 52.70 11/16/2015   Lab Results  Component Value Date   LDLCALC 80 11/16/2015   Lab Results  Component Value Date  TRIG 150.0 (H) 11/16/2015   Lab Results  Component Value Date   CHOLHDL 3 11/16/2015   No results found for: HGBA1C     Assessment & Plan:   Problem List Items Addressed This Visit      Unprioritized   Rash    Under breasts and groin -- + candida ---  Nystatin powder and pred taper rto or call if symptoms do not improve pred should help hands and back-- also use moisturizing lotion for that          I am having Ms. Andrew start on nystatin and predniSONE. I am also having her maintain her aspirin, cetirizine, CoQ10, polyethylene glycol powder, Omega-3, multivitamins with iron, Calcium-Vitamin D-Vitamin K, ICAPS AREDS 2, atorvastatin, and levothyroxine.  Meds ordered this encounter  Medications  . nystatin (NYSTATIN) powder    Sig: Apply topically 4 (four) times daily.    Dispense:  15 g    Refill:  0  . predniSONE (DELTASONE) 10 MG tablet    Sig: TAKE 3 TABLETS PO QD FOR 3 DAYS THEN TAKE 2 TABLETS PO QD FOR 3 DAYS THEN TAKE 1 TABLET PO QD FOR 3 DAYS THEN TAKE 1/2 TAB PO QD FOR 3 DAYS    Dispense:  20 tablet    Refill:  0    CMA served as Education administrator during this visit. History, Physical and Plan performed by medical provider. Documentation and orders reviewed and attested to.  [name], DOPatient ID: Rachel Kline, female   DOB: [birth year], 61 y.o.   MRN: [id number]

## 2016-03-15 ENCOUNTER — Encounter: Payer: Self-pay | Admitting: Family Medicine

## 2016-03-20 ENCOUNTER — Encounter: Payer: Self-pay | Admitting: Family Medicine

## 2016-03-20 ENCOUNTER — Other Ambulatory Visit: Payer: Self-pay | Admitting: Family Medicine

## 2016-03-20 DIAGNOSIS — R21 Rash and other nonspecific skin eruption: Secondary | ICD-10-CM

## 2016-03-20 MED ORDER — CLOTRIMAZOLE-BETAMETHASONE 1-0.05 % EX CREA: 1.0000 "application " | TOPICAL_CREAM | Freq: Two times a day (BID) | CUTANEOUS | 0 refills | Status: DC

## 2016-03-20 MED FILL — CLOTRIMAZOLE-BETAMETHASONE: 1-0.05 | 10 days supply | Qty: 30 | Fill #0

## 2016-03-20 NOTE — Telephone Encounter (Signed)
Rx sent, derm referral placed. Pt informed via [facility].

## 2016-03-20 NOTE — Telephone Encounter (Signed)
Try lotrisone cream bid 60 g  Refer to derm

## 2016-04-07 MED FILL — ATORVASTATIN 20 MG TABLET: 20 | 90 days supply | Qty: 90 | Fill #2

## 2016-04-10 MED FILL — ZOLPIDEM TARTRATE 5 MG TAB: 5 | 30 days supply | Qty: 30 | Fill #0

## 2016-05-15 ENCOUNTER — Encounter: Payer: Self-pay | Admitting: Family Medicine

## 2016-05-15 ENCOUNTER — Ambulatory Visit (INDEPENDENT_AMBULATORY_CARE_PROVIDER_SITE_OTHER): Payer: 59 | Admitting: Family Medicine

## 2016-05-15 VITALS — BP 148/82 | HR 68 | Temp 97.7°F | Resp 16 | Ht 64.0 in | Wt 173.6 lb

## 2016-05-15 DIAGNOSIS — E782 Mixed hyperlipidemia: Secondary | ICD-10-CM

## 2016-05-15 DIAGNOSIS — Z23 Encounter for immunization: Secondary | ICD-10-CM

## 2016-05-15 DIAGNOSIS — E039 Hypothyroidism, unspecified: Secondary | ICD-10-CM

## 2016-05-15 LAB — CBC WITH DIFFERENTIAL/PLATELET
BASOS ABS: 0.1 10*3/uL (ref 0.0–0.1)
Basophils Relative: 0.9 % (ref 0.0–3.0)
EOS ABS: 0.3 10*3/uL (ref 0.0–0.7)
Eosinophils Relative: 5.3 % — ABNORMAL HIGH (ref 0.0–5.0)
HCT: 42.2 % (ref 36.0–46.0)
HEMOGLOBIN: 14.5 g/dL (ref 12.0–15.0)
LYMPHS PCT: 25.8 % (ref 12.0–46.0)
Lymphs Abs: 1.5 10*3/uL (ref 0.7–4.0)
MCHC: 34.4 g/dL (ref 30.0–36.0)
MCV: 93.2 fl (ref 78.0–100.0)
Monocytes Absolute: 0.5 10*3/uL (ref 0.1–1.0)
Monocytes Relative: 8 % (ref 3.0–12.0)
Neutro Abs: 3.5 10*3/uL (ref 1.4–7.7)
Neutrophils Relative %: 60 % (ref 43.0–77.0)
Platelets: 255 10*3/uL (ref 150.0–400.0)
RBC: 4.53 Mil/uL (ref 3.87–5.11)
RDW: 12.8 % (ref 11.5–15.5)
WBC: 5.9 10*3/uL (ref 4.0–10.5)

## 2016-05-15 LAB — COMPREHENSIVE METABOLIC PANEL
ALBUMIN: 4.4 g/dL (ref 3.5–5.2)
ALT: 21 U/L (ref 0–35)
AST: 22 U/L (ref 0–37)
Alkaline Phosphatase: 60 U/L (ref 39–117)
BILIRUBIN TOTAL: 0.4 mg/dL (ref 0.2–1.2)
BUN: 11 mg/dL (ref 6–23)
CALCIUM: 9.7 mg/dL (ref 8.4–10.5)
CO2: 27 mEq/L (ref 19–32)
CREATININE: 0.67 mg/dL (ref 0.40–1.20)
Chloride: 103 mEq/L (ref 96–112)
GFR: 94.45 mL/min (ref >60.00)
Glucose, Bld: 109 mg/dL — ABNORMAL HIGH (ref 70–99)
Potassium: 4.7 mEq/L (ref 3.5–5.1)
Sodium: 137 mEq/L (ref 135–145)
Total Protein: 6.9 g/dL (ref 6.0–8.3)

## 2016-05-15 LAB — LIPID PANEL
CHOL/HDL RATIO: 3
CHOLESTEROL: 157 mg/dL (ref 0–200)
HDL: 51.7 mg/dL (ref >39.00)
LDL Cholesterol: 87 mg/dL (ref 0–99)
NonHDL: 104.89
TRIGLYCERIDES: 90 mg/dL (ref 0.0–149.0)
VLDL: 18 mg/dL (ref 0.0–40.0)

## 2016-05-15 LAB — TSH: TSH: 1.15 u[IU]/mL (ref 0.35–4.50)

## 2016-05-15 NOTE — Patient Instructions (Signed)

## 2016-05-15 NOTE — Progress Notes (Signed)
Subjective:  I acted as a Education administrator for Dr. Royden Purl, LPN    Patient ID: Rachel Kline, female    DOB: 26-Mar-1955, 61 y.o.   MRN: [id number]  Chief Complaint  Patient presents with  . Hyperlipidemia    follow up  . Hypothyroidism    follow up    Hyperlipidemia  This is a chronic problem. The current episode started more than 1 year ago. Pertinent negatives include no chest pain.    Patient is in today for follow up hyperlipidemia, hypothyroidism. Pt inquired about genetic testing for cancer, her mother had pancreatic cancer. Pt report synthroid is working and she's not having issues at this time.  Patient Care Team: [name], DO as PCP - General Olga Millers, MD as Consulting Physician (Obstetrics and Gynecology) Ralene Bathe, MD as Consulting Physician (Ophthalmology) Clarene Essex, MD as Consulting Physician (Gastroenterology)   Past Medical History:  Diagnosis Date  . Contact lens/glasses fitting    wears contacts or glasses  . Hyperlipidemia   . Macular degeneration 07/02/2012    Past Surgical History:  Procedure Laterality Date  . COLONOSCOPY    . GANGLION CYST EXCISION  6/99   rt wrist  . TONSILLECTOMY N/A 11/22/2012   Procedure: TONSILLECTOMY;  Surgeon: Rozetta Nunnery, MD;  Location: [facility];  Service: ENT;  Laterality: N/A;    Family History  Problem Relation Age of Onset  . Pancreatic cancer Mother   . Diabetes Mother   . Heart failure Father   . Kidney failure Father     end stage  . Heart disease Father     chf, cabg  . Hypertension Father   . Hypertension Brother   . Hyperlipidemia Brother   . Coronary artery disease      female 1st degree relative <50    Social History   Social History  . Marital status: Married    Spouse name: N/A  . Number of children: N/A  . Years of education: N/A   Occupational History  . [employer]  . 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 Smoking status: Never Smoker  . Smokeless tobacco: Never Used  . Alcohol use Yes     Comment: rare  . Drug use: No  . Sexual activity: Yes    Partners: Male   Other Topics Concern  . Not on file   Social History Narrative   Exercise-- very little    Outpatient Medications Prior to Visit  Medication Sig Dispense Refill  . aspirin 81 MG EC tablet Take 81 mg by mouth daily.     Marland Kitchen atorvastatin (LIPITOR) 20 MG tablet Take 1 tablet (20 mg total) by mouth daily. 90 tablet 1  . Calcium-Vitamin D-Vitamin K 500-500-40 MG-UNT-MCG CHEW 1 po tid 60 tablet   . cetirizine (ZYRTEC) 10 MG tablet Take 10 mg by mouth daily.      . clotrimazole-betamethasone (LOTRISONE) cream Apply 1 application topically 2 (two) times daily. 60 g 0  . Coenzyme Q10 (COQ10) 30 MG CAPS Take by mouth 2 (two) times daily.      Marland Kitchen levothyroxine (SYNTHROID, LEVOTHROID) 50 MCG tablet Take 1 tablet (50 mcg total) by mouth daily. 90 tablet 3  . Multiple Vitamins-Iron (MULTIVITAMINS WITH IRON) TABS Take 1 tablet by mouth daily.  0  . Multiple Vitamins-Minerals (ICAPS AREDS 2) CAPS Take 2 capsules by mouth  daily.    . nystatin (NYSTATIN) powder Apply topically 4 (four) times daily. 15 g 0  . Omega-3 350 MG CAPS Take by mouth.      . polyethylene glycol (MIRALAX) powder Take 17 g by mouth as needed.      . predniSONE (DELTASONE) 10 MG tablet TAKE 3 TABLETS PO QD FOR 3 DAYS THEN TAKE 2 TABLETS PO QD FOR 3 DAYS THEN TAKE 1 TABLET PO QD FOR 3 DAYS THEN TAKE 1/2 TAB PO QD FOR 3 DAYS 20 tablet 0   No facility-administered medications prior to visit.     No Known Allergies  Review of Systems  Constitutional: Negative for fever.  HENT: Negative for congestion.   Eyes: Negative for blurred vision.  Respiratory: Negative for cough.   Cardiovascular: Negative for chest pain and palpitations.  Gastrointestinal: Negative for vomiting.  Musculoskeletal: Negative for back pain.  Skin:  Negative for rash.  Neurological: Negative for loss of consciousness and headaches.       Objective:    Physical Exam  Constitutional: She is oriented to person, place, and time. She appears well-developed and well-nourished. No distress.  HENT:  Head: Normocephalic and atraumatic.  Nose: Nose normal.  Eyes: Conjunctivae are normal. Pupils are equal, round, and reactive to light. Right eye exhibits no discharge. Left eye exhibits no discharge.  Neck: Normal range of motion. Neck supple. No thyromegaly present.  Cardiovascular: Normal rate and regular rhythm.   No murmur heard. Pulmonary/Chest: Effort normal and breath sounds normal. She has no wheezes.  Abdominal: Soft. Bowel sounds are normal. There is no tenderness.  Musculoskeletal: Normal range of motion. She exhibits no edema or deformity.  Neurological: She is alert and oriented to person, place, and time.  Skin: Skin is warm and dry. She is not diaphoretic.  Psychiatric: She has a normal mood and affect. Her behavior is normal. Judgment and thought content normal.  Nursing note and vitals reviewed.   BP (!) 148/82 (BP Location: Left Arm, Patient Position: Sitting, Cuff Size: Normal)   Pulse 68   Temp 97.7 F (36.5 C) (Oral)   Resp 16   Ht 5\' 4"  (1.626 m)   Wt 173 lb 9.6 oz (78.7 kg)   SpO2 98%   BMI 29.80 kg/m  Wt Readings from Last 3 Encounters:  05/15/16 173 lb 9.6 oz (78.7 kg)  03/09/16 175 lb (79.4 kg)  11/12/15 177 lb 9.6 oz (80.6 kg)   BP Readings from Last 3 Encounters:  05/15/16 (!) 148/82  03/09/16 130/70  11/12/15 140/80     Immunization History  Administered Date(s) Administered  . Influenza Whole 10/30/2007, 10/28/2008, 11/02/2011  . Influenza,inj,Quad PF,36+ Mos 10/25/2012  . Influenza-Unspecified 10/20/2013, 10/21/2013, 10/26/2015  . Td 02/21/2006  . Zoster 08/26/2013    Health Maintenance  Topic Date Due  . Samul Dada  02/22/2016  . MAMMOGRAM  08/03/2016  . INFLUENZA VACCINE   08/16/2016  . PAP SMEAR  09/08/2018  . COLONOSCOPY  06/20/2022  . Hepatitis C Screening  Completed  . HIV Screening  Completed    Lab Results  Component Value Date   WBC 5.1 11/16/2015   HGB 15.2 (H) 11/16/2015   HCT 44.0 11/16/2015   PLT 267.0 11/16/2015   GLUCOSE 96 11/16/2015   CHOL 163 11/16/2015   TRIG 150.0 (H) 11/16/2015   HDL 52.70 11/16/2015   LDLDIRECT 149.7 03/31/2009   LDLCALC 80 11/16/2015   ALT 26 11/16/2015   AST 26 11/16/2015   NA 140  11/16/2015   K 4.3 11/16/2015   CL 104 11/16/2015   CREATININE 0.67 11/16/2015   BUN 12 11/16/2015   CO2 26 11/16/2015   TSH 2.12 02/07/2016   MICROALBUR 0.2 07/15/2012    Lab Results  Component Value Date   TSH 2.12 02/07/2016   Lab Results  Component Value Date   WBC 5.1 11/16/2015   HGB 15.2 (H) 11/16/2015   HCT 44.0 11/16/2015   MCV 92.8 11/16/2015   PLT 267.0 11/16/2015   Lab Results  Component Value Date   NA 140 11/16/2015   K 4.3 11/16/2015   CO2 26 11/16/2015   GLUCOSE 96 11/16/2015   BUN 12 11/16/2015   CREATININE 0.67 11/16/2015   BILITOT 0.6 11/16/2015   ALKPHOS 63 11/16/2015   AST 26 11/16/2015   ALT 26 11/16/2015   PROT 7.2 11/16/2015   ALBUMIN 4.6 11/16/2015   CALCIUM 9.9 11/16/2015   GFR 94.60 11/16/2015   Lab Results  Component Value Date   CHOL 163 11/16/2015   Lab Results  Component Value Date   HDL 52.70 11/16/2015   Lab Results  Component Value Date   LDLCALC 80 11/16/2015   Lab Results  Component Value Date   TRIG 150.0 (H) 11/16/2015   Lab Results  Component Value Date   CHOLHDL 3 11/16/2015   No results found for: HGBA1C       Assessment & Plan:   Problem List Items Addressed This Visit      Unprioritized   Hyperlipidemia - Primary    Tolerating statin, encouraged heart healthy diet, avoid trans fats, minimize simple carbs and saturated fats. Increase exercise as tolerated      Relevant Orders   CBC with Differential/Platelet   Comprehensive metabolic  panel   Lipid panel   Hypothyroidism    con't thyroid med Check labs      Relevant Orders   CBC with Differential/Platelet   Comprehensive metabolic panel   TSH    Other Visit Diagnoses    Need for shingles vaccine       Relevant Orders   Varicella-zoster vaccine IM (Shingrix)      I am having Ms. Colberg maintain her aspirin, cetirizine, CoQ10, polyethylene glycol powder, Omega-3, multivitamins with iron, Calcium-Vitamin D-Vitamin K, ICAPS AREDS 2, atorvastatin, levothyroxine, nystatin, predniSONE, and clotrimazole-betamethasone.  No orders of the defined types were placed in this encounter.   CMA served as Education administrator during this visit. History, Physical and Plan performed by medical provider. Documentation and orders reviewed and attested to.  [name], DO   Patient ID: Rachel Kline, female   DOB: Oct 14, 1955, 61 y.o.   MRN: [id number]

## 2016-05-15 NOTE — Progress Notes (Signed)
Pre visit review using our clinic review tool, if applicable. No additional management support is needed unless otherwise documented below in the visit note. 

## 2016-05-15 NOTE — Assessment & Plan Note (Signed)
con't thyroid med Check labs

## 2016-05-15 NOTE — Assessment & Plan Note (Signed)
Tolerating statin, encouraged heart healthy diet, avoid trans fats, minimize simple carbs and saturated fats. Increase exercise as tolerated 

## 2016-05-19 ENCOUNTER — Other Ambulatory Visit: Payer: Self-pay | Admitting: Family Medicine

## 2016-05-19 DIAGNOSIS — E785 Hyperlipidemia, unspecified: Secondary | ICD-10-CM

## 2016-05-22 MED FILL — ZOLPIDEM TARTRATE 5 MG TAB: 5 | 30 days supply | Qty: 30 | Fill #0

## 2016-05-22 MED FILL — LEVOTHYROXINE 50 MCG TABLET: 50 | 90 days supply | Qty: 90 | Fill #2

## 2016-06-23 MED FILL — PREVIDENT 5000 1.1% DRY MOU: 1.1 | 30 days supply | Qty: 100 | Fill #1

## 2016-06-26 MED FILL — ZOLPIDEM TARTRATE 5 MG TAB: 5 | 30 days supply | Qty: 30 | Fill #0

## 2016-07-10 MED FILL — ATORVASTATIN 20 MG TABLET: 20 | 90 days supply | Qty: 90 | Fill #3

## 2016-07-25 ENCOUNTER — Ambulatory Visit: Payer: 59

## 2016-07-31 MED FILL — ZOLPIDEM TARTRATE 5 MG TAB: 5 | 30 days supply | Qty: 30 | Fill #0

## 2016-08-02 ENCOUNTER — Encounter: Payer: Self-pay | Admitting: Family Medicine

## 2016-08-21 MED FILL — LEVOTHYROXINE 50 MCG TABLET: 50 | 90 days supply | Qty: 90 | Fill #3

## 2016-08-28 ENCOUNTER — Encounter: Payer: Self-pay | Admitting: Family Medicine

## 2016-08-29 MED FILL — ZOLPIDEM TARTRATE 5 MG TAB: 5 | 30 days supply | Qty: 30 | Fill #0

## 2016-09-09 DIAGNOSIS — Z23 Encounter for immunization: Secondary | ICD-10-CM | POA: Diagnosis not present

## 2016-09-11 ENCOUNTER — Encounter: Payer: Self-pay | Admitting: Family Medicine

## 2016-09-27 MED FILL — ATORVASTATIN 20 MG TABLET: 20 | 90 days supply | Qty: 90 | Fill #0

## 2016-09-29 MED FILL — ZOLPIDEM TARTRATE 5 MG TAB: 5 | 30 days supply | Qty: 30 | Fill #0

## 2016-10-16 ENCOUNTER — Encounter: Payer: Self-pay | Admitting: Family Medicine

## 2016-10-31 DIAGNOSIS — Z1231 Encounter for screening mammogram for malignant neoplasm of breast: Secondary | ICD-10-CM | POA: Diagnosis not present

## 2016-10-31 DIAGNOSIS — Z01419 Encounter for gynecological examination (general) (routine) without abnormal findings: Secondary | ICD-10-CM | POA: Diagnosis not present

## 2016-10-31 DIAGNOSIS — Z124 Encounter for screening for malignant neoplasm of cervix: Secondary | ICD-10-CM | POA: Diagnosis not present

## 2016-11-02 MED FILL — ZOLPIDEM TARTRATE 5 MG TAB: 5 | 30 days supply | Qty: 30 | Fill #0

## 2016-11-06 ENCOUNTER — Telehealth: Payer: 59 | Admitting: Family

## 2016-11-06 DIAGNOSIS — R05 Cough: Secondary | ICD-10-CM

## 2016-11-06 DIAGNOSIS — R059 Cough, unspecified: Secondary | ICD-10-CM

## 2016-11-06 MED ORDER — PREDNISONE 10 MG (21) PO TBPK: ORAL_TABLET | ORAL | 0 refills | Status: DC

## 2016-11-06 MED ORDER — BENZONATATE 100 MG PO CAPS: 100.0000 mg | ORAL_CAPSULE | Freq: Three times a day (TID) | ORAL | 0 refills | Status: DC | PRN

## 2016-11-06 MED FILL — BENZONATATE 100 MG CAPS: 100 | 8 days supply | Qty: 20 | Fill #0

## 2016-11-06 MED FILL — predniSONE 10 MG TABS: 10 | 6 days supply | Qty: 21 | Fill #0

## 2016-11-06 NOTE — Progress Notes (Signed)
We are sorry that you are not feeling well.  Here is how we plan to help!  Based on your presentation I believe you most likely have A cough due to a virus.  This is called viral bronchitis and is best treated by rest, plenty of fluids and control of the cough.  You may use Ibuprofen or Tylenol as directed to help your symptoms.     In addition you may use A non-prescription cough medication called Robitussin DAC. Take 2 teaspoons every 8 hours or Delsym: take 2 teaspoons every 12 hours., A non-prescription cough medication called Mucinex DM: take 2 tablets every 12 hours. and A prescription cough medication called Tessalon Perles 100mg . You may take 1-2 capsules every 8 hours as needed for your cough.  Sterapred 10 mg dosepak  From your responses in the eVisit questionnaire you describe inflammation in the upper respiratory tract which is causing a significant cough.  This is commonly called Bronchitis and has four common causes:    Allergies  Viral Infections  Acid Reflux  Bacterial Infection Allergies, viruses and acid reflux are treated by controlling symptoms or eliminating the cause. An example might be a cough caused by taking certain blood pressure medications. You stop the cough by changing the medication. Another example might be a cough caused by acid reflux. Controlling the reflux helps control the cough.  USE OF BRONCHODILATOR ("RESCUE") INHALERS: There is a risk from using your bronchodilator too frequently.  The risk is that over-reliance on a medication which only relaxes the muscles surrounding the breathing tubes can reduce the effectiveness of medications prescribed to reduce swelling and congestion of the tubes themselves.  Although you feel brief relief from the bronchodilator inhaler, your asthma may actually be worsening with the tubes becoming more swollen and filled with mucus.  This can delay other crucial treatments, such as oral steroid medications. If you need to use  a bronchodilator inhaler daily, several times per day, you should discuss this with your provider.  There are probably better treatments that could be used to keep your asthma under control.     HOME CARE . Only take medications as instructed by your medical team. . Complete the entire course of an antibiotic. . Drink plenty of fluids and get plenty of rest. . Avoid close contacts especially the very young and the elderly . Cover your mouth if you cough or cough into your sleeve. . Always remember to wash your hands . A steam or ultrasonic humidifier can help congestion.   GET HELP RIGHT AWAY IF: . You develop worsening fever. . You become short of breath . You cough up blood. . Your symptoms persist after you have completed your treatment plan MAKE SURE YOU   Understand these instructions.  Will watch your condition.  Will get help right away if you are not doing well or get worse.  Your e-visit answers were reviewed by a board certified advanced clinical practitioner to complete your personal care plan.  Depending on the condition, your plan could have included both over the counter or prescription medications. If there is a problem please reply  once you have received a response from your provider. Your safety is important to [place].  If you have drug allergies check your prescription carefully.    You can use MyChart to ask questions about today's visit, request a non-urgent call back, or ask for a work or school excuse for 24 hours related to this e-Visit. If it has  greater than 24 hours you will need to follow up with your provider, or enter a new e-Visit to address those concerns. You will get an e-mail in the next two days asking about your experience.  I hope that your e-visit has been valuable and will speed your recovery. Thank you for using e-visits.   

## 2016-11-07 MED FILL — PREVIDENT 5000 1.1% DRY MOU: 1.1 | 30 days supply | Qty: 100 | Fill #2

## 2016-11-13 ENCOUNTER — Other Ambulatory Visit: Payer: Self-pay | Admitting: Family Medicine

## 2016-11-13 DIAGNOSIS — E038 Other specified hypothyroidism: Secondary | ICD-10-CM

## 2016-11-13 MED FILL — CLOTRIMAZOLE-BETAMETHASONE: 1-0.05 | 10 days supply | Qty: 30 | Fill #1

## 2016-11-13 MED FILL — LEVOTHYROXINE 50 MCG TABLET: 50 | 90 days supply | Qty: 90 | Fill #0

## 2016-11-14 ENCOUNTER — Ambulatory Visit: Payer: 59 | Admitting: Family Medicine

## 2016-12-04 MED FILL — ZOLPIDEM TARTRATE 5 MG TAB: 5 | 30 days supply | Qty: 30 | Fill #0

## 2017-01-01 ENCOUNTER — Other Ambulatory Visit: Payer: Self-pay | Admitting: Family Medicine

## 2017-01-01 DIAGNOSIS — E785 Hyperlipidemia, unspecified: Secondary | ICD-10-CM

## 2017-01-01 MED FILL — ATORVASTATIN 20 MG TABLET: 20 | 90 days supply | Qty: 90 | Fill #0

## 2017-01-02 ENCOUNTER — Encounter: Payer: Self-pay | Admitting: Family Medicine

## 2017-01-02 ENCOUNTER — Ambulatory Visit (INDEPENDENT_AMBULATORY_CARE_PROVIDER_SITE_OTHER): Payer: 59 | Admitting: Family Medicine

## 2017-01-02 VITALS — BP 142/80 | HR 70 | Temp 98.0°F | Ht 64.0 in | Wt 172.0 lb

## 2017-01-02 DIAGNOSIS — E039 Hypothyroidism, unspecified: Secondary | ICD-10-CM | POA: Diagnosis not present

## 2017-01-02 DIAGNOSIS — E785 Hyperlipidemia, unspecified: Secondary | ICD-10-CM | POA: Diagnosis not present

## 2017-01-02 DIAGNOSIS — Z0000 Encounter for general adult medical examination without abnormal findings: Secondary | ICD-10-CM | POA: Insufficient documentation

## 2017-01-02 LAB — COMPREHENSIVE METABOLIC PANEL
ALT: 24 U/L (ref 0–35)
AST: 24 U/L (ref 0–37)
Albumin: 4.5 g/dL (ref 3.5–5.2)
Alkaline Phosphatase: 53 U/L (ref 39–117)
BUN: 10 mg/dL (ref 6–23)
CHLORIDE: 102 meq/L (ref 96–112)
CO2: 28 meq/L (ref 19–32)
Calcium: 9.4 mg/dL (ref 8.4–10.5)
Creatinine, Ser: 0.69 mg/dL (ref 0.40–1.20)
GFR: 91.11 mL/min (ref >60.00)
GLUCOSE: 109 mg/dL — AB (ref 70–99)
Potassium: 4.5 mEq/L (ref 3.5–5.1)
Sodium: 137 mEq/L (ref 135–145)
TOTAL PROTEIN: 6.9 g/dL (ref 6.0–8.3)
Total Bilirubin: 0.6 mg/dL (ref 0.2–1.2)

## 2017-01-02 LAB — CBC WITH DIFFERENTIAL/PLATELET
BASOS ABS: 0.1 10*3/uL (ref 0.0–0.1)
BASOS PCT: 1.2 % (ref 0.0–3.0)
EOS ABS: 0.2 10*3/uL (ref 0.0–0.7)
Eosinophils Relative: 4.4 % (ref 0.0–5.0)
HCT: 44.5 % (ref 36.0–46.0)
Hemoglobin: 15.2 g/dL — ABNORMAL HIGH (ref 12.0–15.0)
Lymphocytes Relative: 25.9 % (ref 12.0–46.0)
Lymphs Abs: 1.2 10*3/uL (ref 0.7–4.0)
MCHC: 34.2 g/dL (ref 30.0–36.0)
MCV: 94.4 fl (ref 78.0–100.0)
MONOS PCT: 7.3 % (ref 3.0–12.0)
Monocytes Absolute: 0.3 10*3/uL (ref 0.1–1.0)
NEUTROS ABS: 2.9 10*3/uL (ref 1.4–7.7)
NEUTROS PCT: 61.2 % (ref 43.0–77.0)
PLATELETS: 272 10*3/uL (ref 150.0–400.0)
RBC: 4.71 Mil/uL (ref 3.87–5.11)
RDW: 12.5 % (ref 11.5–15.5)
WBC: 4.7 10*3/uL (ref 4.0–10.5)

## 2017-01-02 LAB — POC URINALSYSI DIPSTICK (AUTOMATED)
BILIRUBIN UA: NEGATIVE
Blood, UA: NEGATIVE
GLUCOSE UA: NEGATIVE
KETONES UA: NEGATIVE
LEUKOCYTES UA: NEGATIVE
Nitrite, UA: NEGATIVE
Protein, UA: NEGATIVE
Spec Grav, UA: 1.02 (ref 1.010–1.025)
Urobilinogen, UA: 0.2 E.U./dL
pH, UA: 6 (ref 5.0–8.0)

## 2017-01-02 LAB — TSH: TSH: 0.99 u[IU]/mL (ref 0.35–4.50)

## 2017-01-02 LAB — LIPID PANEL
CHOL/HDL RATIO: 2
Cholesterol: 143 mg/dL (ref 0–200)
HDL: 60.3 mg/dL (ref >39.00)
LDL CALC: 60 mg/dL (ref 0–99)
NonHDL: 82.31
TRIGLYCERIDES: 113 mg/dL (ref 0.0–149.0)
VLDL: 22.6 mg/dL (ref 0.0–40.0)

## 2017-01-02 MED FILL — ZOLPIDEM TARTRATE 5 MG TAB: 5 | 30 days supply | Qty: 30 | Fill #0

## 2017-01-02 NOTE — Assessment & Plan Note (Signed)
ghm utd Check labs See AVS 

## 2017-01-02 NOTE — Patient Instructions (Signed)
Preventive Care 40-64 Years, Female Preventive care refers to lifestyle choices and visits with your health care provider that can promote health and wellness. What does preventive care include?  A yearly physical exam. This is also called an annual well check.  Dental exams once or twice a year.  Routine eye exams. Ask your health care provider how often you should have your eyes checked.  Personal lifestyle choices, including: ? Daily care of your teeth and gums. ? Regular physical activity. ? Eating a healthy diet. ? Avoiding tobacco and drug use. ? Limiting alcohol use. ? Practicing safe sex. ? Taking low-dose aspirin daily starting at age 58. ? Taking vitamin and mineral supplements as recommended by your health care provider. What happens during an annual well check? The services and screenings done by your health care provider during your annual well check will depend on your age, overall health, lifestyle risk factors, and family history of disease. Counseling Your health care provider may ask you questions about your:  Alcohol use.  Tobacco use.  Drug use.  Emotional well-being.  Home and relationship well-being.  Sexual activity.  Eating habits.  Work and work Statistician.  Method of birth control.  Menstrual cycle.  Pregnancy history.  Screening You may have the following tests or measurements:  Height, weight, and BMI.  Blood pressure.  Lipid and cholesterol levels. These may be checked every 5 years, or more frequently if you are over 81 years old.  Skin check.  Lung cancer screening. You may have this screening every year starting at age 78 if you have a 30-pack-year history of smoking and currently smoke or have quit within the past 15 years.  Fecal occult blood test (FOBT) of the stool. You may have this test every year starting at age 65.  Flexible sigmoidoscopy or colonoscopy. You may have a sigmoidoscopy every 5 years or a colonoscopy  every 10 years starting at age 30.  Hepatitis C blood test.  Hepatitis B blood test.  Sexually transmitted disease (STD) testing.  Diabetes screening. This is done by checking your blood sugar (glucose) after you have not eaten for a while (fasting). You may have this done every 1-3 years.  Mammogram. This may be done every 1-2 years. Talk to your health care provider about when you should start having regular mammograms. This may depend on whether you have a family history of breast cancer.  BRCA-related cancer screening. This may be done if you have a family history of breast, ovarian, tubal, or peritoneal cancers.  Pelvic exam and Pap test. This may be done every 3 years starting at age 80. Starting at age 36, this may be done every 5 years if you have a Pap test in combination with an HPV test.  Bone density scan. This is done to screen for osteoporosis. You may have this scan if you are at high risk for osteoporosis.  Discuss your test results, treatment options, and if necessary, the need for more tests with your health care provider. Vaccines Your health care provider may recommend certain vaccines, such as:  Influenza vaccine. This is recommended every year.  Tetanus, diphtheria, and acellular pertussis (Tdap, Td) vaccine. You may need a Td booster every 10 years.  Varicella vaccine. You may need this if you have not been vaccinated.  Zoster vaccine. You may need this after age 5.  Measles, mumps, and rubella (MMR) vaccine. You may need at least one dose of MMR if you were born in  1957 or later. You may also need a second dose.  Pneumococcal 13-valent conjugate (PCV13) vaccine. You may need this if you have certain conditions and were not previously vaccinated.  Pneumococcal polysaccharide (PPSV23) vaccine. You may need one or two doses if you smoke cigarettes or if you have certain conditions.  Meningococcal vaccine. You may need this if you have certain  conditions.  Hepatitis A vaccine. You may need this if you have certain conditions or if you travel or work in places where you may be exposed to hepatitis A.  Hepatitis B vaccine. You may need this if you have certain conditions or if you travel or work in places where you may be exposed to hepatitis B.  Haemophilus influenzae type b (Hib) vaccine. You may need this if you have certain conditions.  Talk to your health care provider about which screenings and vaccines you need and how often you need them. This information is not intended to replace advice given to you by your health care provider. Make sure you discuss any questions you have with your health care provider. Document Released: 01/29/2015 Document Revised: 09/22/2015 Document Reviewed: 11/03/2014 Elsevier Interactive Patient Education  2017 [employer].

## 2017-01-02 NOTE — Progress Notes (Signed)
Subjective:  I acted as a Education administrator for [employer], [place]   Patient ID: Rachel Kline, female    DOB: 11-18-1955, 61 y.o.   MRN: [id number]  Chief Complaint  Patient presents with  . Annual Exam    CPE no PAP No Concerns     HPI  Patient is in today for annual exam   Pt has appointment with Dr Satira Sark (with dr Claudean Kinds)  at [facility] coming up.  She will also see Dr Watt Climes in July/August.    Patient Care Team: Carollee Herter, Alferd Apa, DO as PCP - General Olga Millers, MD as Consulting Physician (Obstetrics and Gynecology) Ralene Bathe, MD as Consulting Physician (Ophthalmology) Clarene Essex, MD as Consulting Physician (Gastroenterology)   Past Medical History:  Diagnosis Date  . Contact lens/glasses fitting    wears contacts or glasses  . Hyperlipidemia   . Macular degeneration 07/02/2012    Past Surgical History:  Procedure Laterality Date  . COLONOSCOPY    . GANGLION CYST EXCISION  6/99   rt wrist  . TONSILLECTOMY N/A 11/22/2012   Procedure: TONSILLECTOMY;  Surgeon: Rozetta Nunnery, MD;  Location: [facility];  Service: ENT;  Laterality: N/A;    Family History  Problem Relation Age of Onset  . Pancreatic cancer Mother   . Diabetes Mother   . Heart failure Father   . Kidney failure Father        end stage  . Heart disease Father        chf, cabg  . Hypertension Father   . Hypertension Brother   . Hyperlipidemia Brother   . Coronary artery disease Unknown        female 1st degree relative <50    Social History   Socioeconomic History  . Marital status: Married    Spouse name: Not on file  . Number of children: Not on file  . Years of education: Not on file  . Highest education level: Not on file  Social Needs  . Financial resource strain: Not on file  . Food insecurity - worry: Not on file  . Food insecurity - inability: Not on file  . Transportation needs - medical: Not on file  . Transportation needs - non-medical: Not  on file  Occupational History  . Occupation: Optician, dispensing: [employer]  . 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 Smoking status: Never Smoker  . Smokeless tobacco: Never Used  Substance and Sexual Activity  . Alcohol use: Yes    Comment: rare  . Drug use: No  . Sexual activity: Yes    Partners: Male  Other Topics Concern  . Not on file  Social History Narrative   Exercise-- very little    Outpatient Medications Prior to Visit  Medication Sig Dispense Refill  . aspirin 81 MG EC tablet Take 81 mg by mouth daily.     Marland Kitchen atorvastatin (LIPITOR) 20 MG tablet TAKE 1 TABLET BY MOUTH ONCE DAILY 90 tablet 1  . Calcium-Vitamin D-Vitamin K 500-500-40 MG-UNT-MCG CHEW 1 po tid 60 tablet   . cetirizine (ZYRTEC) 10 MG tablet Take 10 mg by mouth daily.      . clotrimazole-betamethasone (LOTRISONE) cream Apply 1 application topically 2 (two) times daily. 60 g 0  . Coenzyme Q10 (COQ10) 30 MG CAPS Take by mouth 2 (two) times daily.      Marland Kitchen levothyroxine (  SYNTHROID, LEVOTHROID) 50 MCG tablet TAKE 1 TABLET BY MOUTH ONCE DAILY 90 tablet 3  . Multiple Vitamins-Iron (MULTIVITAMINS WITH IRON) TABS Take 1 tablet by mouth daily.  0  . Multiple Vitamins-Minerals (ICAPS AREDS 2) CAPS Take 2 capsules by mouth daily.    . Omega-3 350 MG CAPS Take by mouth.      . benzonatate (TESSALON PERLES) 100 MG capsule Take 1 capsule (100 mg total) by mouth 3 (three) times daily as needed. 20 capsule 0  . nystatin (NYSTATIN) powder Apply topically 4 (four) times daily. 15 g 0  . polyethylene glycol (MIRALAX) powder Take 17 g by mouth as needed.      . predniSONE (DELTASONE) 10 MG tablet TAKE 3 TABLETS PO QD FOR 3 DAYS THEN TAKE 2 TABLETS PO QD FOR 3 DAYS THEN TAKE 1 TABLET PO QD FOR 3 DAYS THEN TAKE 1/2 TAB PO QD FOR 3 DAYS 20 tablet 0  . predniSONE (STERAPRED UNI-PAK 21 TAB) 10 MG (21) TBPK tablet Use as directed 21 tablet 0   No facility-administered  medications prior to visit.     No Known Allergies  Review of Systems  Constitutional: Negative for chills, fever and malaise/fatigue.  HENT: Negative for congestion and hearing loss.   Eyes: Negative for blurred vision and discharge.  Respiratory: Negative for cough, sputum production and shortness of breath.   Cardiovascular: Negative for chest pain, palpitations and leg swelling.  Gastrointestinal: Negative for abdominal pain, blood in stool, constipation, diarrhea, heartburn, nausea and vomiting.  Genitourinary: Negative for dysuria, frequency, hematuria and urgency.  Musculoskeletal: Negative for back pain, falls and myalgias.  Skin: Negative for rash.  Neurological: Negative for dizziness, sensory change, loss of consciousness, weakness and headaches.  Endo/Heme/Allergies: Negative for environmental allergies. Does not bruise/bleed easily.  Psychiatric/Behavioral: Negative for depression and suicidal ideas. The patient is not nervous/anxious and does not have insomnia.        Objective:    Physical Exam  Constitutional: She is oriented to person, place, and time. She appears well-developed and well-nourished. No distress.  HENT:  Head: Normocephalic and atraumatic.  Right Ear: External ear normal.  Left Ear: External ear normal.  Nose: Nose normal.  Mouth/Throat: Oropharynx is clear and moist.  Eyes: Conjunctivae and EOM are normal. Pupils are equal, round, and reactive to light.  Neck: Normal range of motion. Neck supple. No JVD present. Carotid bruit is not present. No thyromegaly present.  Cardiovascular: Normal rate, regular rhythm and normal heart sounds.  No murmur heard. Pulmonary/Chest: Effort normal and breath sounds normal. No respiratory distress. She has no wheezes. She has no rales. She exhibits no tenderness.  Musculoskeletal: She exhibits no edema.  Neurological: She is alert and oriented to person, place, and time.  Psychiatric: She has a normal mood and  affect. Her behavior is normal. Judgment and thought content normal.  Nursing note and vitals reviewed.   BP (!) 142/80   Pulse 70   Temp 98 F (36.7 C) (Oral)   Ht 5\' 4"  (1.626 m)   Wt 172 lb (78 kg)   SpO2 98%   BMI 29.52 kg/m  Wt Readings from Last 3 Encounters:  01/02/17 172 lb (78 kg)  05/15/16 173 lb 9.6 oz (78.7 kg)  03/09/16 175 lb (79.4 kg)   BP Readings from Last 3 Encounters:  01/02/17 (!) 142/80  05/15/16 (!) 148/82  03/09/16 130/70     Immunization History  Administered Date(s) Administered  . Influenza Whole 10/30/2007, 10/28/2008,  11/02/2011  . Influenza,inj,Quad PF,6+ Mos 10/25/2012  . Influenza-Unspecified 10/20/2013, 10/21/2013, 10/26/2015  . Td 02/21/2006  . Tdap 05/15/2016  . Zoster 08/26/2013  . Zoster Recombinat (Shingrix) 05/15/2016, 09/09/2016    Health Maintenance  Topic Date Due  . PAP SMEAR  09/08/2018  . MAMMOGRAM  11/09/2018  . COLONOSCOPY  06/20/2022  . TETANUS/TDAP  05/16/2026  . INFLUENZA VACCINE  Completed  . Hepatitis C Screening  Completed  . HIV Screening  Completed    Lab Results  Component Value Date   WBC 5.9 05/15/2016   HGB 14.5 05/15/2016   HCT 42.2 05/15/2016   PLT 255.0 05/15/2016   GLUCOSE 109 (H) 05/15/2016   CHOL 157 05/15/2016   TRIG 90.0 05/15/2016   HDL 51.70 05/15/2016   LDLDIRECT 149.7 03/31/2009   LDLCALC 87 05/15/2016   ALT 21 05/15/2016   AST 22 05/15/2016   NA 137 05/15/2016   K 4.7 05/15/2016   CL 103 05/15/2016   CREATININE 0.67 05/15/2016   BUN 11 05/15/2016   CO2 27 05/15/2016   TSH 1.15 05/15/2016   MICROALBUR 0.2 07/15/2012    Lab Results  Component Value Date   TSH 1.15 05/15/2016   Lab Results  Component Value Date   WBC 5.9 05/15/2016   HGB 14.5 05/15/2016   HCT 42.2 05/15/2016   MCV 93.2 05/15/2016   PLT 255.0 05/15/2016   Lab Results  Component Value Date   NA 137 05/15/2016   K 4.7 05/15/2016   CO2 27 05/15/2016   GLUCOSE 109 (H) 05/15/2016   BUN 11 05/15/2016    CREATININE 0.67 05/15/2016   BILITOT 0.4 05/15/2016   ALKPHOS 60 05/15/2016   AST 22 05/15/2016   ALT 21 05/15/2016   PROT 6.9 05/15/2016   ALBUMIN 4.4 05/15/2016   CALCIUM 9.7 05/15/2016   GFR 94.45 05/15/2016   Lab Results  Component Value Date   CHOL 157 05/15/2016   Lab Results  Component Value Date   HDL 51.70 05/15/2016   Lab Results  Component Value Date   LDLCALC 87 05/15/2016   Lab Results  Component Value Date   TRIG 90.0 05/15/2016   Lab Results  Component Value Date   CHOLHDL 3 05/15/2016   No results found for: HGBA1C       Assessment & Plan:   Problem List Items Addressed This Visit      Unprioritized   Hyperlipidemia LDL goal <100    Tolerating statin, encouraged heart healthy diet, avoid trans fats, minimize simple carbs and saturated fats. Increase exercise as tolerated      Relevant Orders   Lipid panel   Comprehensive metabolic panel   Hypothyroidism    Check labs con't synthroid      Relevant Orders   TSH   Preventative health care - Primary    ghm utd Check labs See AVS      Relevant Orders   TSH   Lipid panel   CBC with Differential/Platelet   Comprehensive metabolic panel   POCT Urinalysis Dipstick (Automated)      I have discontinued Orlene Plum "Debbie"'s polyethylene glycol powder, nystatin, predniSONE, benzonatate, and predniSONE. I am also having her maintain her aspirin, cetirizine, CoQ10, Omega-3, multivitamins with iron, Calcium-Vitamin D-Vitamin K, ICAPS AREDS 2, clotrimazole-betamethasone, levothyroxine, and atorvastatin.  No orders of the defined types were placed in this encounter.   CMA served as Education administrator during this visit. History, Physical and Plan performed by medical provider. Documentation and orders reviewed  and attested to.  [name], DO

## 2017-01-02 NOTE — Assessment & Plan Note (Signed)
Check labs con't synthroid 

## 2017-01-02 NOTE — Assessment & Plan Note (Signed)
Tolerating statin, encouraged heart healthy diet, avoid trans fats, minimize simple carbs and saturated fats. Increase exercise as tolerated 

## 2017-02-02 MED FILL — ZOLPIDEM TARTRATE 5 MG TAB: 5 | 30 days supply | Qty: 30 | Fill #0

## 2017-02-20 ENCOUNTER — Encounter: Payer: Self-pay | Admitting: Family Medicine

## 2017-02-20 NOTE — Telephone Encounter (Signed)
Mac degeneration is on her problem list --- did she need [place] to do something else

## 2017-02-26 MED FILL — LEVOTHYROXINE 50 MCG TABLET: 50 | 90 days supply | Qty: 90 | Fill #1

## 2017-04-02 MED FILL — ATORVASTATIN 20 MG TABLET: 20 | 90 days supply | Qty: 90 | Fill #1

## 2017-04-04 MED FILL — PREVIDENT 5000 1.1% DRY MOU: 1.1 | 30 days supply | Qty: 100 | Fill #0

## 2017-05-14 MED FILL — LEVOTHYROXINE 50 MCG TABLET: 50 | 90 days supply | Qty: 90 | Fill #2

## 2017-07-02 ENCOUNTER — Other Ambulatory Visit: Payer: Self-pay | Admitting: Family Medicine

## 2017-07-02 DIAGNOSIS — E785 Hyperlipidemia, unspecified: Secondary | ICD-10-CM

## 2017-07-02 MED FILL — ATORVASTATIN 20 MG TABLET: 20 | 90 days supply | Qty: 90 | Fill #0

## 2017-07-05 ENCOUNTER — Encounter: Payer: Self-pay | Admitting: Family Medicine

## 2017-07-05 ENCOUNTER — Ambulatory Visit (INDEPENDENT_AMBULATORY_CARE_PROVIDER_SITE_OTHER): Payer: No Typology Code available for payment source | Admitting: Family Medicine

## 2017-07-05 VITALS — BP 149/66 | HR 67 | Temp 98.0°F | Resp 16 | Ht 64.0 in | Wt 173.4 lb

## 2017-07-05 DIAGNOSIS — E785 Hyperlipidemia, unspecified: Secondary | ICD-10-CM

## 2017-07-05 DIAGNOSIS — R030 Elevated blood-pressure reading, without diagnosis of hypertension: Secondary | ICD-10-CM | POA: Diagnosis not present

## 2017-07-05 LAB — COMPREHENSIVE METABOLIC PANEL
ALT: 31 U/L (ref 0–35)
AST: 28 U/L (ref 0–37)
Albumin: 4.6 g/dL (ref 3.5–5.2)
Alkaline Phosphatase: 63 U/L (ref 39–117)
BILIRUBIN TOTAL: 0.5 mg/dL (ref 0.2–1.2)
BUN: 13 mg/dL (ref 6–23)
CALCIUM: 9.6 mg/dL (ref 8.4–10.5)
CHLORIDE: 101 meq/L (ref 96–112)
CO2: 28 meq/L (ref 19–32)
Creatinine, Ser: 0.65 mg/dL (ref 0.40–1.20)
GFR: 97.46 mL/min (ref >60.00)
GLUCOSE: 104 mg/dL — AB (ref 70–99)
POTASSIUM: 4.3 meq/L (ref 3.5–5.1)
Sodium: 137 mEq/L (ref 135–145)
Total Protein: 6.8 g/dL (ref 6.0–8.3)

## 2017-07-05 LAB — LIPID PANEL
CHOL/HDL RATIO: 3
Cholesterol: 179 mg/dL (ref 0–200)
HDL: 55.2 mg/dL (ref >39.00)
LDL CALC: 100 mg/dL — AB (ref 0–99)
NONHDL: 124.12
Triglycerides: 121 mg/dL (ref 0.0–149.0)
VLDL: 24.2 mg/dL (ref 0.0–40.0)

## 2017-07-05 NOTE — Assessment & Plan Note (Signed)
Pt given info for healthy weight and wellness

## 2017-07-05 NOTE — Progress Notes (Signed)
Patient ID: Rachel Kline, female   DOB: 28-Feb-1955, 62 y.o.   MRN: [id number]     Subjective:  I acted as a Education administrator for Dr. Carollee Herter.  Guerry Bruin, [place]   Patient ID: Rachel Kline, female    DOB: [birth year], 62 y.o.   MRN: [id number]  Chief Complaint  Patient presents with  . Hyperlipidemia    HPI  Patient is in today for follow up cholesterol. Doing well on current treatment.  No complaints except she is frustrated with her weight.  Patient Care Team: Carollee Herter, Alferd Apa, DO as PCP - General Olga Millers, MD as Consulting Physician (Obstetrics and Gynecology) Ralene Bathe, MD as Consulting Physician (Ophthalmology) Clarene Essex, MD as Consulting Physician (Gastroenterology)   Past Medical History:  Diagnosis Date  . Contact lens/glasses fitting    wears contacts or glasses  . Hyperlipidemia   . Macular degeneration 07/02/2012    Past Surgical History:  Procedure Laterality Date  . COLONOSCOPY    . GANGLION CYST EXCISION  6/99   rt wrist  . TONSILLECTOMY N/A 11/22/2012   Procedure: TONSILLECTOMY;  Surgeon: Rozetta Nunnery, MD;  Location: [facility];  Service: ENT;  Laterality: N/A;    Family History  Problem Relation Age of Onset  . Pancreatic cancer Mother   . Diabetes Mother   . Heart failure Father   . Kidney failure Father        end stage  . Heart disease Father        chf, cabg  . Hypertension Father   . Hypertension Brother   . Hyperlipidemia Brother   . Coronary artery disease Unknown        female 1st degree relative <50    Social History   Socioeconomic History  . Marital status: Married    Spouse name: Not on file  . Number of children: Not on file  . Years of education: Not on file  . Highest education level: Not on file  Occupational History  . Occupation: Optician, dispensing: [employer]  . 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 Financial  resource strain: Not on file  . Food insecurity:    Worry: Not on file    Inability: Not on file  . Transportation needs:    Medical: Not on file    Non-medical: Not on file  Tobacco Use  . Smoking status: Never Smoker  . Smokeless tobacco: Never Used  Substance and Sexual Activity  . Alcohol use: Yes    Comment: rare  . Drug use: No  . Sexual activity: Yes    Partners: Male  Lifestyle  . Physical activity:    Days per week: Not on file    Minutes per session: Not on file  . Stress: Not on file  Relationships  . Social connections:    Talks on phone: Not on file    Gets together: Not on file    Attends religious service: Not on file    Active member of club or organization: Not on file    Attends meetings of clubs or organizations: Not on file    Relationship status: Not on file  . Intimate partner violence:    Fear of current or ex partner: Not on file    Emotionally abused: Not on file    Physically abused: Not on file    Forced sexual activity: Not on  file  Other Topics Concern  . Not on file  Social History Narrative   Exercise-- very little    Outpatient Medications Prior to Visit  Medication Sig Dispense Refill  . aspirin 81 MG EC tablet Take 81 mg by mouth daily.     Marland Kitchen atorvastatin (LIPITOR) 20 MG tablet TAKE 1 TABLET BY MOUTH ONCE DAILY 90 tablet 1  . Calcium-Vitamin D-Vitamin K 500-500-40 MG-UNT-MCG CHEW 1 po tid 60 tablet   . cetirizine (ZYRTEC) 10 MG tablet Take 10 mg by mouth daily.      . clotrimazole-betamethasone (LOTRISONE) cream Apply 1 application topically 2 (two) times daily. 60 g 0  . Coenzyme Q10 (COQ10) 30 MG CAPS Take by mouth 2 (two) times daily.      Marland Kitchen levothyroxine (SYNTHROID, LEVOTHROID) 50 MCG tablet TAKE 1 TABLET BY MOUTH ONCE DAILY 90 tablet 3  . Multiple Vitamins-Iron (MULTIVITAMINS WITH IRON) TABS Take 1 tablet by mouth daily.  0  . Multiple Vitamins-Minerals (ICAPS AREDS 2) CAPS Take 2 capsules by mouth daily.    . Omega-3 350 MG  CAPS Take by mouth.       No facility-administered medications prior to visit.     No Known Allergies  Review of Systems  Constitutional: Negative for fever and malaise/fatigue.  HENT: Negative for congestion.   Eyes: Negative for blurred vision.  Respiratory: Negative for cough and shortness of breath.   Cardiovascular: Negative for chest pain, palpitations and leg swelling.  Gastrointestinal: Negative for vomiting.  Musculoskeletal: Negative for back pain.  Skin: Negative for rash.  Neurological: Negative for loss of consciousness and headaches.       Objective:    Physical Exam  Constitutional: She is oriented to person, place, and time. She appears well-developed and well-nourished.  HENT:  Head: Normocephalic and atraumatic.  Eyes: Conjunctivae and EOM are normal.  Neck: Normal range of motion. Neck supple. No JVD present. Carotid bruit is not present. No thyromegaly present.  Cardiovascular: Normal rate, regular rhythm and normal heart sounds.  No murmur heard. Pulmonary/Chest: Effort normal and breath sounds normal. No respiratory distress. She has no wheezes. She has no rales. She exhibits no tenderness.  Musculoskeletal: She exhibits no edema.  Neurological: She is alert and oriented to person, place, and time.  Psychiatric: She has a normal mood and affect.  Nursing note and vitals reviewed.   BP (!) 149/66   Pulse 67   Temp 98 F (36.7 C) (Oral)   Resp 16   Ht 5\' 4"  (1.626 m)   Wt 173 lb 6.4 oz (78.7 kg)   SpO2 100%   BMI 29.76 kg/m  Wt Readings from Last 3 Encounters:  07/05/17 173 lb 6.4 oz (78.7 kg)  01/02/17 172 lb (78 kg)  05/15/16 173 lb 9.6 oz (78.7 kg)   BP Readings from Last 3 Encounters:  07/05/17 (!) 149/66  01/02/17 (!) 142/80  05/15/16 (!) 148/82     Immunization History  Administered Date(s) Administered  . Influenza Whole 10/30/2007, 10/28/2008, 11/02/2011  . Influenza,inj,Quad PF,6+ Mos 10/25/2012  . Influenza-Unspecified  10/20/2013, 10/21/2013, 10/26/2015  . Td 02/21/2006  . Tdap 05/15/2016  . Zoster 08/26/2013  . Zoster Recombinat (Shingrix) 05/15/2016, 09/09/2016    Health Maintenance  Topic Date Due  . INFLUENZA VACCINE  08/16/2017  . PAP SMEAR  09/08/2018  . MAMMOGRAM  11/09/2018  . COLONOSCOPY  06/20/2022  . TETANUS/TDAP  05/16/2026  . Hepatitis C Screening  Completed  . HIV Screening  Completed  Lab Results  Component Value Date   WBC 4.7 01/02/2017   HGB 15.2 (H) 01/02/2017   HCT 44.5 01/02/2017   PLT 272.0 01/02/2017   GLUCOSE 104 (H) 07/05/2017   CHOL 179 07/05/2017   TRIG 121.0 07/05/2017   HDL 55.20 07/05/2017   LDLDIRECT 149.7 03/31/2009   LDLCALC 100 (H) 07/05/2017   ALT 31 07/05/2017   AST 28 07/05/2017   NA 137 07/05/2017   K 4.3 07/05/2017   CL 101 07/05/2017   CREATININE 0.65 07/05/2017   BUN 13 07/05/2017   CO2 28 07/05/2017   TSH 0.99 01/02/2017   MICROALBUR 0.2 07/15/2012    Lab Results  Component Value Date   TSH 0.99 01/02/2017   Lab Results  Component Value Date   WBC 4.7 01/02/2017   HGB 15.2 (H) 01/02/2017   HCT 44.5 01/02/2017   MCV 94.4 01/02/2017   PLT 272.0 01/02/2017   Lab Results  Component Value Date   NA 137 07/05/2017   K 4.3 07/05/2017   CO2 28 07/05/2017   GLUCOSE 104 (H) 07/05/2017   BUN 13 07/05/2017   CREATININE 0.65 07/05/2017   BILITOT 0.5 07/05/2017   ALKPHOS 63 07/05/2017   AST 28 07/05/2017   ALT 31 07/05/2017   PROT 6.8 07/05/2017   ALBUMIN 4.6 07/05/2017   CALCIUM 9.6 07/05/2017   GFR 97.46 07/05/2017   Lab Results  Component Value Date   CHOL 179 07/05/2017   Lab Results  Component Value Date   HDL 55.20 07/05/2017   Lab Results  Component Value Date   LDLCALC 100 (H) 07/05/2017   Lab Results  Component Value Date   TRIG 121.0 07/05/2017   Lab Results  Component Value Date   CHOLHDL 3 07/05/2017   No results found for: HGBA1C       Assessment & Plan:   Problem List Items Addressed This  Visit      Unprioritized   Elevated BP without diagnosis of hypertension    Dash diet  Weight loss        Hyperlipidemia LDL goal <100 - Primary   Relevant Orders   Comprehensive metabolic panel (Completed)   Lipid panel (Completed)   Amb Ref to Medical Weight Management   Morbid obesity ([place])    Pt given info for healthy weight and wellness      Relevant Orders   Amb Ref to Medical Weight Management      I am having Orlene Plum "Debbie" maintain her aspirin, cetirizine, CoQ10, Omega-3, multivitamins with iron, Calcium-Vitamin D-Vitamin K, ICAPS AREDS 2, clotrimazole-betamethasone, levothyroxine, and atorvastatin.  No orders of the defined types were placed in this encounter.   CMA served as Education administrator during this visit. History, Physical and Plan performed by medical provider. Documentation and orders reviewed and attested to.  [name], DO

## 2017-07-05 NOTE — Assessment & Plan Note (Signed)
Dash diet  Weight loss

## 2017-07-05 NOTE — Patient Instructions (Signed)
DASH Eating Plan DASH stands for "Dietary Approaches to Stop Hypertension." The DASH eating plan is a healthy eating plan that has been shown to reduce high blood pressure (hypertension). It may also reduce your risk for type 2 diabetes, heart disease, and stroke. The DASH eating plan may also help with weight loss. What are tips for following this plan? General guidelines  Avoid eating more than 2,300 mg (milligrams) of salt (sodium) a day. If you have hypertension, you may need to reduce your sodium intake to 1,500 mg a day.  Limit alcohol intake to no more than 1 drink a day for nonpregnant women and 2 drinks a day for men. One drink equals 12 oz of beer, 5 oz of wine, or 1 oz of hard liquor.  Work with your health care provider to maintain a healthy body weight or to lose weight. Ask what an ideal weight is for you.  Get at least 30 minutes of exercise that causes your heart to beat faster (aerobic exercise) most days of the week. Activities may include walking, swimming, or biking.  Work with your health care provider or diet and nutrition specialist (dietitian) to adjust your eating plan to your individual calorie needs. Reading food labels  Check food labels for the amount of sodium per serving. Choose foods with less than 5 percent of the Daily Value of sodium. Generally, foods with less than 300 mg of sodium per serving fit into this eating plan.  To find whole grains, look for the word "whole" as the first word in the ingredient list. Shopping  Buy products labeled as "low-sodium" or "no salt added."  Buy fresh foods. Avoid canned foods and premade or frozen meals. Cooking  Avoid adding salt when cooking. Use salt-free seasonings or herbs instead of table salt or sea salt. Check with your health care provider or pharmacist before using salt substitutes.  Do not fry foods. Cook foods using healthy methods such as baking, boiling, grilling, and broiling instead.  Cook with  heart-healthy oils, such as olive, canola, soybean, or sunflower oil. Meal planning   Eat a balanced diet that includes: ? 5 or more servings of fruits and vegetables each day. At each meal, try to fill half of your plate with fruits and vegetables. ? Up to 6-8 servings of whole grains each day. ? Less than 6 oz of lean meat, poultry, or fish each day. A 3-oz serving of meat is about the same size as a deck of cards. One egg equals 1 oz. ? 2 servings of low-fat dairy each day. ? A serving of nuts, seeds, or beans 5 times each week. ? Heart-healthy fats. Healthy fats called Omega-3 fatty acids are found in foods such as flaxseeds and coldwater fish, like sardines, salmon, and mackerel.  Limit how much you eat of the following: ? Canned or prepackaged foods. ? Food that is high in trans fat, such as fried foods. ? Food that is high in saturated fat, such as fatty meat. ? Sweets, desserts, sugary drinks, and other foods with added sugar. ? Full-fat dairy products.  Do not salt foods before eating.  Try to eat at least 2 vegetarian meals each week.  Eat more home-cooked food and less restaurant, buffet, and fast food.  When eating at a restaurant, ask that your food be prepared with less salt or no salt, if possible. What foods are recommended? The items listed may not be a complete list. Talk with your dietitian about what   dietary choices are best for you. Grains Whole-grain or whole-wheat bread. Whole-grain or whole-wheat pasta. Brown rice. Oatmeal. Quinoa. Bulgur. Whole-grain and low-sodium cereals. Pita bread. Low-fat, low-sodium crackers. Whole-wheat flour tortillas. Vegetables Fresh or frozen vegetables (raw, steamed, roasted, or grilled). Low-sodium or reduced-sodium tomato and vegetable juice. Low-sodium or reduced-sodium tomato sauce and tomato paste. Low-sodium or reduced-sodium canned vegetables. Fruits All fresh, dried, or frozen fruit. Canned fruit in natural juice (without  added sugar). Meat and other protein foods Skinless chicken or turkey. Ground chicken or turkey. Pork with fat trimmed off. Fish and seafood. Egg whites. Dried beans, peas, or lentils. Unsalted nuts, nut butters, and seeds. Unsalted canned beans. Lean cuts of beef with fat trimmed off. Low-sodium, lean deli meat. Dairy Low-fat (1%) or fat-free (skim) milk. Fat-free, low-fat, or reduced-fat cheeses. Nonfat, low-sodium ricotta or cottage cheese. Low-fat or nonfat yogurt. Low-fat, low-sodium cheese. Fats and oils Soft margarine without trans fats. Vegetable oil. Low-fat, reduced-fat, or light mayonnaise and salad dressings (reduced-sodium). Canola, safflower, olive, soybean, and sunflower oils. Avocado. Seasoning and other foods Herbs. Spices. Seasoning mixes without salt. Unsalted popcorn and pretzels. Fat-free sweets. What foods are not recommended? The items listed may not be a complete list. Talk with your dietitian about what dietary choices are best for you. Grains Baked goods made with fat, such as croissants, muffins, or some breads. Dry pasta or rice meal packs. Vegetables Creamed or fried vegetables. Vegetables in a cheese sauce. Regular canned vegetables (not low-sodium or reduced-sodium). Regular canned tomato sauce and paste (not low-sodium or reduced-sodium). Regular tomato and vegetable juice (not low-sodium or reduced-sodium). Pickles. Olives. Fruits Canned fruit in a light or heavy syrup. Fried fruit. Fruit in cream or butter sauce. Meat and other protein foods Fatty cuts of meat. Ribs. Fried meat. Bacon. Sausage. Bologna and other processed lunch meats. Salami. Fatback. Hotdogs. Bratwurst. Salted nuts and seeds. Canned beans with added salt. Canned or smoked fish. Whole eggs or egg yolks. Chicken or turkey with skin. Dairy Whole or 2% milk, cream, and half-and-half. Whole or full-fat cream cheese. Whole-fat or sweetened yogurt. Full-fat cheese. Nondairy creamers. Whipped toppings.  Processed cheese and cheese spreads. Fats and oils Butter. Stick margarine. Lard. Shortening. Ghee. Bacon fat. Tropical oils, such as coconut, palm kernel, or palm oil. Seasoning and other foods Salted popcorn and pretzels. Onion salt, garlic salt, seasoned salt, table salt, and sea salt. Worcestershire sauce. Tartar sauce. Barbecue sauce. Teriyaki sauce. Soy sauce, including reduced-sodium. Steak sauce. Canned and packaged gravies. Fish sauce. Oyster sauce. Cocktail sauce. Horseradish that you find on the shelf. Ketchup. Mustard. Meat flavorings and tenderizers. Bouillon cubes. Hot sauce and Tabasco sauce. Premade or packaged marinades. Premade or packaged taco seasonings. Relishes. Regular salad dressings. Where to find more information:  National Heart, Lung, and Blood Institute: www.nhlbi.nih.gov  American Heart Association: www.heart.org Summary  The DASH eating plan is a healthy eating plan that has been shown to reduce high blood pressure (hypertension). It may also reduce your risk for type 2 diabetes, heart disease, and stroke.  With the DASH eating plan, you should limit salt (sodium) intake to 2,300 mg a day. If you have hypertension, you may need to reduce your sodium intake to 1,500 mg a day.  When on the DASH eating plan, aim to eat more fresh fruits and vegetables, whole grains, lean proteins, low-fat dairy, and heart-healthy fats.  Work with your health care provider or diet and nutrition specialist (dietitian) to adjust your eating plan to your individual   calorie needs. This information is not intended to replace advice given to you by your health care provider. Make sure you discuss any questions you have with your health care provider. Document Released: 12/22/2010 Document Revised: 12/27/2015 Document Reviewed: 12/27/2015 Elsevier Interactive Patient Education  2018 [employer]  

## 2017-08-14 ENCOUNTER — Ambulatory Visit: Payer: No Typology Code available for payment source

## 2017-08-20 MED FILL — LEVOTHYROXINE 50 MCG TABLET: 50 | 90 days supply | Qty: 90 | Fill #3

## 2017-08-20 MED FILL — PREVIDENT 5000 1.1% DRY MOU: 1.1 | 30 days supply | Qty: 100 | Fill #1

## 2017-09-05 ENCOUNTER — Encounter: Payer: Self-pay | Admitting: Family Medicine

## 2017-09-05 DIAGNOSIS — Z1211 Encounter for screening for malignant neoplasm of colon: Secondary | ICD-10-CM

## 2017-09-05 DIAGNOSIS — H3530 Unspecified macular degeneration: Secondary | ICD-10-CM

## 2017-09-06 NOTE — Telephone Encounter (Signed)
She needs referral for both

## 2017-09-25 MED FILL — ATORVASTATIN CALCIUM 20 MG: 20 | 90 days supply | Qty: 90 | Fill #1

## 2017-10-18 ENCOUNTER — Encounter: Payer: Self-pay | Admitting: Family Medicine

## 2017-10-19 MED FILL — PEG-3350 SOLUTION: 420 | 1 days supply | Qty: 4000 | Fill #0

## 2017-11-02 LAB — HM COLONOSCOPY

## 2017-11-13 ENCOUNTER — Other Ambulatory Visit: Payer: Self-pay | Admitting: Family Medicine

## 2017-11-13 DIAGNOSIS — E038 Other specified hypothyroidism: Secondary | ICD-10-CM

## 2017-11-13 DIAGNOSIS — E785 Hyperlipidemia, unspecified: Secondary | ICD-10-CM

## 2017-11-13 MED FILL — LEVOTHYROXINE 50 MCG TABLET: 50 | 90 days supply | Qty: 90 | Fill #0

## 2017-12-11 ENCOUNTER — Encounter: Payer: Self-pay | Admitting: Family Medicine

## 2017-12-20 MED FILL — ATORVASTATIN CALCIUM 20 MG: 20 | 90 days supply | Qty: 90 | Fill #0

## 2018-01-02 ENCOUNTER — Encounter (INDEPENDENT_AMBULATORY_CARE_PROVIDER_SITE_OTHER): Payer: No Typology Code available for payment source | Admitting: Ophthalmology

## 2018-01-02 DIAGNOSIS — H353112 Nonexudative age-related macular degeneration, right eye, intermediate dry stage: Secondary | ICD-10-CM | POA: Diagnosis not present

## 2018-01-02 DIAGNOSIS — H353221 Exudative age-related macular degeneration, left eye, with active choroidal neovascularization: Secondary | ICD-10-CM | POA: Diagnosis not present

## 2018-01-02 DIAGNOSIS — H43813 Vitreous degeneration, bilateral: Secondary | ICD-10-CM

## 2018-01-02 DIAGNOSIS — H2513 Age-related nuclear cataract, bilateral: Secondary | ICD-10-CM

## 2018-01-02 MED FILL — BESIVANCE 0.6% SUSP: 0.6 | 30 days supply | Qty: 5 | Fill #0

## 2018-01-04 ENCOUNTER — Encounter: Payer: Self-pay | Admitting: Family Medicine

## 2018-01-23 ENCOUNTER — Telehealth: Payer: No Typology Code available for payment source | Admitting: Family

## 2018-01-23 DIAGNOSIS — J209 Acute bronchitis, unspecified: Secondary | ICD-10-CM | POA: Diagnosis not present

## 2018-01-23 MED ORDER — BENZONATATE 100 MG PO CAPS: 100.0000 mg | ORAL_CAPSULE | Freq: Three times a day (TID) | ORAL | 0 refills | Status: DC | PRN

## 2018-01-23 MED ORDER — PREDNISONE 10 MG (21) PO TBPK: ORAL_TABLET | ORAL | 0 refills | Status: DC

## 2018-01-23 NOTE — Progress Notes (Signed)
We are sorry that you are not feeling well.  Here is how we plan to help!  Based on your presentation I believe you most likely have A cough due to a virus.  This is called viral bronchitis and is best treated by rest, plenty of fluids and control of the cough.  You may use Ibuprofen or Tylenol as directed to help your symptoms.     In addition you may use A non-prescription cough medication called Robitussin DAC. Take 2 teaspoons every 8 hours or Delsym: take 2 teaspoons every 12 hours., A non-prescription cough medication called Mucinex DM: take 2 tablets every 12 hours. and A prescription cough medication called Tessalon Perles 100mg. You may take 1-2 capsules every 8 hours as needed for your cough.  Prednisone 10 mg daily for 6 days (see taper instructions below)  Directions for 6 day taper: Day 1: 2 tablets before breakfast, 1 after both lunch & dinner and 2 at bedtime Day 2: 1 tab before breakfast, 1 after both lunch & dinner and 2 at bedtime Day 3: 1 tab at each meal & 1 at bedtime Day 4: 1 tab at breakfast, 1 at lunch, 1 at bedtime Day 5: 1 tab at breakfast & 1 tab at bedtime Day 6: 1 tab at breakfast   From your responses in the eVisit questionnaire you describe inflammation in the upper respiratory tract which is causing a significant cough.  This is commonly called Bronchitis and has four common causes:    Allergies  Viral Infections  Acid Reflux  Bacterial Infection Allergies, viruses and acid reflux are treated by controlling symptoms or eliminating the cause. An example might be a cough caused by taking certain blood pressure medications. You stop the cough by changing the medication. Another example might be a cough caused by acid reflux. Controlling the reflux helps control the cough.  USE OF BRONCHODILATOR ("RESCUE") INHALERS: There is a risk from using your bronchodilator too frequently.  The risk is that over-reliance on a medication which only relaxes the muscles  surrounding the breathing tubes can reduce the effectiveness of medications prescribed to reduce swelling and congestion of the tubes themselves.  Although you feel brief relief from the bronchodilator inhaler, your asthma may actually be worsening with the tubes becoming more swollen and filled with mucus.  This can delay other crucial treatments, such as oral steroid medications. If you need to use a bronchodilator inhaler daily, several times per day, you should discuss this with your provider.  There are probably better treatments that could be used to keep your asthma under control.     HOME CARE . Only take medications as instructed by your medical team. . Complete the entire course of an antibiotic. . Drink plenty of fluids and get plenty of rest. . Avoid close contacts especially the very young and the elderly . Cover your mouth if you cough or cough into your sleeve. . Always remember to wash your hands . A steam or ultrasonic humidifier can help congestion.   GET HELP RIGHT AWAY IF: . You develop worsening fever. . You become short of breath . You cough up blood. . Your symptoms persist after you have completed your treatment plan MAKE SURE YOU   Understand these instructions.  Will watch your condition.  Will get help right away if you are not doing well or get worse.  Your e-visit answers were reviewed by a board certified advanced clinical practitioner to complete your personal care plan.    Depending on the condition, your plan could have included both over the counter or prescription medications. If there is a problem please reply  once you have received a response from your provider. Your safety is important to us.  If you have drug allergies check your prescription carefully.    You can use MyChart to ask questions about today's visit, request a non-urgent call back, or ask for a work or school excuse for 24 hours related to this e-Visit. If it has been greater than 24  hours you will need to follow up with your provider, or enter a new e-Visit to address those concerns. You will get an e-mail in the next two days asking about your experience.  I hope that your e-visit has been valuable and will speed your recovery. Thank you for using e-visits.   

## 2018-01-24 MED FILL — predniSONE 10 MG TABS: 10 | 6 days supply | Qty: 21 | Fill #0

## 2018-01-24 MED FILL — BENZONATATE 100 MG CAP: 100 | 6 days supply | Qty: 20 | Fill #0

## 2018-02-06 ENCOUNTER — Encounter (INDEPENDENT_AMBULATORY_CARE_PROVIDER_SITE_OTHER): Payer: No Typology Code available for payment source | Admitting: Ophthalmology

## 2018-02-06 DIAGNOSIS — H43813 Vitreous degeneration, bilateral: Secondary | ICD-10-CM

## 2018-02-06 DIAGNOSIS — H2513 Age-related nuclear cataract, bilateral: Secondary | ICD-10-CM | POA: Diagnosis not present

## 2018-02-06 DIAGNOSIS — H353221 Exudative age-related macular degeneration, left eye, with active choroidal neovascularization: Secondary | ICD-10-CM

## 2018-02-06 DIAGNOSIS — H353112 Nonexudative age-related macular degeneration, right eye, intermediate dry stage: Secondary | ICD-10-CM

## 2018-02-15 MED FILL — LEVOTHYROXINE 50 MCG TABLET: 50 | 90 days supply | Qty: 90 | Fill #1

## 2018-03-15 MED FILL — PREVIDENT 5000 1.1% DRY MOU: 1.1 | 30 days supply | Qty: 100 | Fill #0

## 2018-03-18 ENCOUNTER — Encounter: Payer: Self-pay | Admitting: Family Medicine

## 2018-03-18 ENCOUNTER — Ambulatory Visit (INDEPENDENT_AMBULATORY_CARE_PROVIDER_SITE_OTHER): Payer: No Typology Code available for payment source | Admitting: Family Medicine

## 2018-03-18 VITALS — BP 136/80 | HR 78 | Resp 14 | Ht 63.5 in | Wt 170.0 lb

## 2018-03-18 DIAGNOSIS — Z0000 Encounter for general adult medical examination without abnormal findings: Secondary | ICD-10-CM

## 2018-03-18 DIAGNOSIS — E039 Hypothyroidism, unspecified: Secondary | ICD-10-CM

## 2018-03-18 DIAGNOSIS — E785 Hyperlipidemia, unspecified: Secondary | ICD-10-CM | POA: Diagnosis not present

## 2018-03-18 LAB — CBC WITH DIFFERENTIAL/PLATELET
Basophils Absolute: 0.1 10*3/uL (ref 0.0–0.1)
Basophils Relative: 0.9 % (ref 0.0–3.0)
Eosinophils Absolute: 0.1 10*3/uL (ref 0.0–0.7)
Eosinophils Relative: 2.3 % (ref 0.0–5.0)
HCT: 42.1 % (ref 36.0–46.0)
Hemoglobin: 14.4 g/dL (ref 12.0–15.0)
LYMPHS ABS: 1.6 10*3/uL (ref 0.7–4.0)
Lymphocytes Relative: 26.2 % (ref 12.0–46.0)
MCHC: 34.4 g/dL (ref 30.0–36.0)
MCV: 93.5 fl (ref 78.0–100.0)
MONOS PCT: 6.3 % (ref 3.0–12.0)
Monocytes Absolute: 0.4 10*3/uL (ref 0.1–1.0)
NEUTROS ABS: 4 10*3/uL (ref 1.4–7.7)
NEUTROS PCT: 64.3 % (ref 43.0–77.0)
Platelets: 242 10*3/uL (ref 150.0–400.0)
RBC: 4.5 Mil/uL (ref 3.87–5.11)
RDW: 13 % (ref 11.5–15.5)
WBC: 6.2 10*3/uL (ref 4.0–10.5)

## 2018-03-18 LAB — COMPREHENSIVE METABOLIC PANEL
ALT: 24 U/L (ref 0–35)
AST: 22 U/L (ref 0–37)
Albumin: 4.7 g/dL (ref 3.5–5.2)
Alkaline Phosphatase: 57 U/L (ref 39–117)
BILIRUBIN TOTAL: 0.5 mg/dL (ref 0.2–1.2)
BUN: 14 mg/dL (ref 6–23)
CO2: 28 meq/L (ref 19–32)
Calcium: 9.8 mg/dL (ref 8.4–10.5)
Chloride: 101 mEq/L (ref 96–112)
Creatinine, Ser: 0.58 mg/dL (ref 0.40–1.20)
GFR: 104.35 mL/min (ref >60.00)
Glucose, Bld: 85 mg/dL (ref 70–99)
Potassium: 4.6 mEq/L (ref 3.5–5.1)
Sodium: 138 mEq/L (ref 135–145)
Total Protein: 6.5 g/dL (ref 6.0–8.3)

## 2018-03-18 LAB — LIPID PANEL
CHOL/HDL RATIO: 3
Cholesterol: 148 mg/dL (ref 0–200)
HDL: 56.2 mg/dL (ref >39.00)
LDL Cholesterol: 75 mg/dL (ref 0–99)
NonHDL: 91.31
TRIGLYCERIDES: 80 mg/dL (ref 0.0–149.0)
VLDL: 16 mg/dL (ref 0.0–40.0)

## 2018-03-18 LAB — TSH: TSH: 0.84 u[IU]/mL (ref 0.35–4.50)

## 2018-03-18 NOTE — Progress Notes (Signed)
Subjective:     Rachel Kline is a 63 y.o. female and is here for a comprehensive physical exam. The patient reports no problems.  Social History   Socioeconomic History  . Marital status: Married    Spouse name: Not on file  . Number of children: Not on file  . Years of education: Not on file  . Highest education level: Not on file  Occupational History  . Occupation: Optician, dispensing: [employer]  . 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 Financial resource strain: Not on file  . Food insecurity:    Worry: Not on file    Inability: Not on file  . Transportation needs:    Medical: Not on file    Non-medical: Not on file  Tobacco Use  . Smoking status: Never Smoker  . Smokeless tobacco: Never Used  Substance and Sexual Activity  . Alcohol use: Yes    Comment: rare  . Drug use: No  . Sexual activity: Yes    Partners: Male  Lifestyle  . Physical activity:    Days per week: Not on file    Minutes per session: Not on file  . Stress: Not on file  Relationships  . Social connections:    Talks on phone: Not on file    Gets together: Not on file    Attends religious service: Not on file    Active member of club or organization: Not on file    Attends meetings of clubs or organizations: Not on file    Relationship status: Not on file  . Intimate partner violence:    Fear of current or ex partner: Not on file    Emotionally abused: Not on file    Physically abused: Not on file    Forced sexual activity: Not on file  Other Topics Concern  . Not on file  Social History Narrative   Exercise-- 30 min daily    Health Maintenance  Topic Date Due  . PAP SMEAR-Modifier  09/08/2018  . MAMMOGRAM  11/09/2018  . COLONOSCOPY  11/03/2022  . TETANUS/TDAP  05/16/2026  . INFLUENZA VACCINE  Completed  . Hepatitis C Screening  Completed  . HIV Screening  Completed    The following portions of the patient's  history were reviewed and updated as appropriate:  She  has a past medical history of Contact lens/glasses fitting, Hyperlipidemia, and Macular degeneration (07/02/2012). She does not have any pertinent problems on file. She  has a past surgical history that includes Ganglion cyst excision (6/99); Colonoscopy; and Tonsillectomy (N/A, 11/22/2012). Her family history includes Coronary artery disease in an other family member; Diabetes in her mother; Heart disease in her father; Heart failure in her father; Hyperlipidemia in her brother; Hypertension in her brother and father; Kidney failure in her father; Pancreatic cancer in her mother. She  reports that she has never smoked. She has never used smokeless tobacco. She reports current alcohol use. She reports that she does not use drugs. She has a current medication list which includes the following prescription(s): aspirin, atorvastatin, calcium-vitamin d-vitamin k, cetirizine, coq10, levothyroxine, multivitamins with iron, icaps areds 2, and omega-3. Current Outpatient Medications on File Prior to Visit  Medication Sig Dispense Refill  . aspirin 81 MG EC tablet Take 81 mg by mouth daily.     Marland Kitchen atorvastatin (LIPITOR) 20 MG tablet TAKE 1 TABLET BY MOUTH ONCE DAILY 90 tablet  1  . Calcium-Vitamin D-Vitamin K 500-500-40 MG-UNT-MCG CHEW 1 po tid 60 tablet   . cetirizine (ZYRTEC) 10 MG tablet Take 10 mg by mouth daily.      . Coenzyme Q10 (COQ10) 30 MG CAPS Take by mouth 2 (two) times daily.      Marland Kitchen levothyroxine (SYNTHROID, LEVOTHROID) 50 MCG tablet TAKE 1 TABLET BY MOUTH ONCE DAILY 90 tablet 1  . Multiple Vitamins-Iron (MULTIVITAMINS WITH IRON) TABS Take 1 tablet by mouth daily.  0  . Multiple Vitamins-Minerals (ICAPS AREDS 2) CAPS Take 2 capsules by mouth daily.    . Omega-3 350 MG CAPS Take by mouth.       No current facility-administered medications on file prior to visit.    She has No Known Allergies..  Review of Systems Review of Systems   Constitutional: Negative for activity change, appetite change and fatigue.  HENT: Negative for hearing loss, congestion, tinnitus and ear discharge.  dentist q65m Eyes: Negative for visual disturbance (see optho q1y -- vision corrected to 20/20 with glasses).  Respiratory: Negative for cough, chest tightness and shortness of breath.   Cardiovascular: Negative for chest pain, palpitations and leg swelling.  Gastrointestinal: Negative for abdominal pain, diarrhea, constipation and abdominal distention.  Genitourinary: Negative for urgency, frequency, decreased urine volume and difficulty urinating.  Musculoskeletal: Negative for back pain, arthralgias and gait problem.  Skin: Negative for color change, pallor and rash.  Neurological: Negative for dizziness, light-headedness, numbness and headaches.  Hematological: Negative for adenopathy. Does not bruise/bleed easily.  Psychiatric/Behavioral: Negative for suicidal ideas, confusion, sleep disturbance, self-injury, dysphoric mood, decreased concentration and agitation.       Objective:    BP 136/80 (BP Location: Right Arm, Patient Position: Sitting, Cuff Size: Normal)   Pulse 78   Resp 14   Ht 5' 3.5" (1.613 m)   Wt 170 lb (77.1 kg)   SpO2 98%   BMI 29.64 kg/m  General appearance: alert, cooperative, appears stated age and no distress Head: Normocephalic, without obvious abnormality, atraumatic Eyes: conjunctivae/corneas clear. PERRL, EOM's intact. Fundi benign. Ears: normal TM's and external ear canals both ears Nose: Nares normal. Septum midline. Mucosa normal. No drainage or sinus tenderness. Throat: lips, mucosa, and tongue normal; teeth and gums normal Neck: no adenopathy, no carotid bruit, no JVD, supple, symmetrical, trachea midline and thyroid not enlarged, symmetric, no tenderness/mass/nodules Back: symmetric, no curvature. ROM normal. No CVA tenderness. Lungs: clear to auscultation bilaterally Breasts: gyn Heart: regular  rate and rhythm, S1, S2 normal, no murmur, click, rub or gallop Abdomen: soft, non-tender; bowel sounds normal; no masses,  no organomegaly Pelvic: deferred--gyn Extremities: extremities normal, atraumatic, no cyanosis or edema Pulses: 2+ and symmetric Skin: Skin color, texture, turgor normal. No rashes or lesions Lymph nodes: Cervical, supraclavicular, and axillary nodes normal. Neurologic: Alert and oriented X 3, normal strength and tone. Normal symmetric reflexes. Normal coordination and gait    Assessment:    Healthy female exam.      Plan:    ghm utd Check labs  See After Visit Summary for Counseling Recommendations   1. Preventative health care See above - TSH - Lipid panel - CBC with Differential/Platelet - Comprehensive metabolic panel  2. Hyperlipidemia, unspecified hyperlipidemia type Tolerating statin, encouraged heart healthy diet, avoid trans fats, minimize simple carbs and saturated fats. Increase exercise as tolerated - Lipid panel - Comprehensive metabolic panel  3. Hypothyroidism, unspecified type Check labs  con't meds - TSH

## 2018-03-18 NOTE — Patient Instructions (Signed)
Preventive Care 40-64 Years, Female Preventive care refers to lifestyle choices and visits with your health care provider that can promote health and wellness. What does preventive care include?   A yearly physical exam. This is also called an annual well check.  Dental exams once or twice a year.  Routine eye exams. Ask your health care provider how often you should have your eyes checked.  Personal lifestyle choices, including: ? Daily care of your teeth and gums. ? Regular physical activity. ? Eating a healthy diet. ? Avoiding tobacco and drug use. ? Limiting alcohol use. ? Practicing safe sex. ? Taking low-dose aspirin daily starting at age 50. ? Taking vitamin and mineral supplements as recommended by your health care provider. What happens during an annual well check? The services and screenings done by your health care provider during your annual well check will depend on your age, overall health, lifestyle risk factors, and family history of disease. Counseling Your health care provider may ask you questions about your:  Alcohol use.  Tobacco use.  Drug use.  Emotional well-being.  Home and relationship well-being.  Sexual activity.  Eating habits.  Work and work environment.  Method of birth control.  Menstrual cycle.  Pregnancy history. Screening You may have the following tests or measurements:  Height, weight, and BMI.  Blood pressure.  Lipid and cholesterol levels. These may be checked every 5 years, or more frequently if you are over 50 years old.  Skin check.  Lung cancer screening. You may have this screening every year starting at age 55 if you have a 30-pack-year history of smoking and currently smoke or have quit within the past 15 years.  Colorectal cancer screening. All adults should have this screening starting at age 50 and continuing until age 75. Your health care provider may recommend screening at age 45. You will have tests every  1-10 years, depending on your results and the type of screening test. People at increased risk should start screening at an earlier age. Screening tests may include: ? Guaiac-based fecal occult blood testing. ? Fecal immunochemical test (FIT). ? Stool DNA test. ? Virtual colonoscopy. ? Sigmoidoscopy. During this test, a flexible tube with a tiny camera (sigmoidoscope) is used to examine your rectum and lower colon. The sigmoidoscope is inserted through your anus into your rectum and lower colon. ? Colonoscopy. During this test, a long, thin, flexible tube with a tiny camera (colonoscope) is used to examine your entire colon and rectum.  Hepatitis C blood test.  Hepatitis B blood test.  Sexually transmitted disease (STD) testing.  Diabetes screening. This is done by checking your blood sugar (glucose) after you have not eaten for a while (fasting). You may have this done every 1-3 years.  Mammogram. This may be done every 1-2 years. Talk to your health care provider about when you should start having regular mammograms. This may depend on whether you have a family history of breast cancer.  BRCA-related cancer screening. This may be done if you have a family history of breast, ovarian, tubal, or peritoneal cancers.  Pelvic exam and Pap test. This may be done every 3 years starting at age 21. Starting at age 30, this may be done every 5 years if you have a Pap test in combination with an HPV test.  Bone density scan. This is done to screen for osteoporosis. You may have this scan if you are at high risk for osteoporosis. Discuss your test results, treatment options,   and if necessary, the need for more tests with your health care provider. Vaccines Your health care provider may recommend certain vaccines, such as:  Influenza vaccine. This is recommended every year.  Tetanus, diphtheria, and acellular pertussis (Tdap, Td) vaccine. You may need a Td booster every 10 years.  Varicella  vaccine. You may need this if you have not been vaccinated.  Zoster vaccine. You may need this after age 38.  Measles, mumps, and rubella (MMR) vaccine. You may need at least one dose of MMR if you were born in 1957 or later. You may also need a second dose.  Pneumococcal 13-valent conjugate (PCV13) vaccine. You may need this if you have certain conditions and were not previously vaccinated.  Pneumococcal polysaccharide (PPSV23) vaccine. You may need one or two doses if you smoke cigarettes or if you have certain conditions.  Meningococcal vaccine. You may need this if you have certain conditions.  Hepatitis A vaccine. You may need this if you have certain conditions or if you travel or work in places where you may be exposed to hepatitis A.  Hepatitis B vaccine. You may need this if you have certain conditions or if you travel or work in places where you may be exposed to hepatitis B.  Haemophilus influenzae type b (Hib) vaccine. You may need this if you have certain conditions. Talk to your health care provider about which screenings and vaccines you need and how often you need them. This information is not intended to replace advice given to you by your health care provider. Make sure you discuss any questions you have with your health care provider. Document Released: 01/29/2015 Document Revised: 02/22/2017 Document Reviewed: 11/03/2014 Elsevier Interactive Patient Education  2019 [employer].

## 2018-03-20 ENCOUNTER — Encounter (INDEPENDENT_AMBULATORY_CARE_PROVIDER_SITE_OTHER): Payer: No Typology Code available for payment source | Admitting: Ophthalmology

## 2018-03-20 DIAGNOSIS — H43813 Vitreous degeneration, bilateral: Secondary | ICD-10-CM

## 2018-03-20 DIAGNOSIS — H353231 Exudative age-related macular degeneration, bilateral, with active choroidal neovascularization: Secondary | ICD-10-CM

## 2018-03-20 DIAGNOSIS — H2513 Age-related nuclear cataract, bilateral: Secondary | ICD-10-CM

## 2018-03-20 DIAGNOSIS — H353112 Nonexudative age-related macular degeneration, right eye, intermediate dry stage: Secondary | ICD-10-CM | POA: Diagnosis not present

## 2018-03-22 ENCOUNTER — Encounter: Payer: Self-pay | Admitting: Family Medicine

## 2018-03-25 NOTE — Telephone Encounter (Signed)
cbcd is completley normal

## 2018-03-26 MED FILL — ATORVASTATIN 20 MG TABLET: 20 | 90 days supply | Qty: 90 | Fill #1

## 2018-05-01 ENCOUNTER — Other Ambulatory Visit: Payer: Self-pay

## 2018-05-01 ENCOUNTER — Encounter (INDEPENDENT_AMBULATORY_CARE_PROVIDER_SITE_OTHER): Payer: No Typology Code available for payment source | Admitting: Ophthalmology

## 2018-05-01 DIAGNOSIS — H43813 Vitreous degeneration, bilateral: Secondary | ICD-10-CM | POA: Diagnosis not present

## 2018-05-01 DIAGNOSIS — H2513 Age-related nuclear cataract, bilateral: Secondary | ICD-10-CM | POA: Diagnosis not present

## 2018-05-01 DIAGNOSIS — H353221 Exudative age-related macular degeneration, left eye, with active choroidal neovascularization: Secondary | ICD-10-CM

## 2018-05-01 DIAGNOSIS — H353112 Nonexudative age-related macular degeneration, right eye, intermediate dry stage: Secondary | ICD-10-CM

## 2018-05-16 ENCOUNTER — Other Ambulatory Visit: Payer: Self-pay | Admitting: Family Medicine

## 2018-05-16 DIAGNOSIS — E038 Other specified hypothyroidism: Secondary | ICD-10-CM

## 2018-05-17 MED FILL — LEVOTHYROXINE 50 MCG TABLET: 50 | 90 days supply | Qty: 90 | Fill #0

## 2018-06-19 ENCOUNTER — Other Ambulatory Visit: Payer: Self-pay

## 2018-06-19 ENCOUNTER — Encounter (INDEPENDENT_AMBULATORY_CARE_PROVIDER_SITE_OTHER): Payer: No Typology Code available for payment source | Admitting: Ophthalmology

## 2018-06-19 DIAGNOSIS — H43813 Vitreous degeneration, bilateral: Secondary | ICD-10-CM | POA: Diagnosis not present

## 2018-06-19 DIAGNOSIS — H353221 Exudative age-related macular degeneration, left eye, with active choroidal neovascularization: Secondary | ICD-10-CM

## 2018-06-19 DIAGNOSIS — H33112 Cyst of ora serrata, left eye: Secondary | ICD-10-CM

## 2018-06-19 DIAGNOSIS — H2513 Age-related nuclear cataract, bilateral: Secondary | ICD-10-CM | POA: Diagnosis not present

## 2018-06-25 ENCOUNTER — Other Ambulatory Visit: Payer: Self-pay | Admitting: Family Medicine

## 2018-06-25 DIAGNOSIS — E785 Hyperlipidemia, unspecified: Secondary | ICD-10-CM

## 2018-06-26 ENCOUNTER — Encounter: Payer: Self-pay | Admitting: Family Medicine

## 2018-06-26 DIAGNOSIS — E785 Hyperlipidemia, unspecified: Secondary | ICD-10-CM

## 2018-06-26 MED ORDER — ATORVASTATIN CALCIUM 20 MG PO TABS: 20.0000 mg | ORAL_TABLET | Freq: Every day | ORAL | 1 refills | Status: DC

## 2018-06-26 MED FILL — ATORVASTATIN 20 MG TABLET: 20 | 90 days supply | Qty: 90 | Fill #0

## 2018-08-07 ENCOUNTER — Other Ambulatory Visit: Payer: Self-pay

## 2018-08-07 ENCOUNTER — Encounter (INDEPENDENT_AMBULATORY_CARE_PROVIDER_SITE_OTHER): Payer: No Typology Code available for payment source | Admitting: Ophthalmology

## 2018-08-07 DIAGNOSIS — H43813 Vitreous degeneration, bilateral: Secondary | ICD-10-CM | POA: Diagnosis not present

## 2018-08-07 DIAGNOSIS — H353112 Nonexudative age-related macular degeneration, right eye, intermediate dry stage: Secondary | ICD-10-CM | POA: Diagnosis not present

## 2018-08-07 DIAGNOSIS — H353221 Exudative age-related macular degeneration, left eye, with active choroidal neovascularization: Secondary | ICD-10-CM

## 2018-08-07 DIAGNOSIS — H2513 Age-related nuclear cataract, bilateral: Secondary | ICD-10-CM

## 2018-08-12 MED FILL — LEVOTHYROXINE 50 MCG TABLET: 50 | 90 days supply | Qty: 90 | Fill #1

## 2018-09-17 MED FILL — PREVIDENT 5000 1.1% DRY MOU: 1.1 | 30 days supply | Qty: 100 | Fill #0

## 2018-09-17 MED FILL — ATORVASTATIN 20 MG TABLET: 20 | 90 days supply | Qty: 90 | Fill #1

## 2018-09-19 ENCOUNTER — Encounter: Payer: Self-pay | Admitting: Family Medicine

## 2018-09-19 ENCOUNTER — Other Ambulatory Visit: Payer: Self-pay

## 2018-09-19 ENCOUNTER — Ambulatory Visit (INDEPENDENT_AMBULATORY_CARE_PROVIDER_SITE_OTHER): Payer: No Typology Code available for payment source | Admitting: Family Medicine

## 2018-09-19 VITALS — BP 138/80 | HR 76 | Temp 97.8°F | Resp 18 | Ht 63.5 in | Wt 171.8 lb

## 2018-09-19 DIAGNOSIS — E785 Hyperlipidemia, unspecified: Secondary | ICD-10-CM | POA: Diagnosis not present

## 2018-09-19 DIAGNOSIS — E039 Hypothyroidism, unspecified: Secondary | ICD-10-CM

## 2018-09-19 DIAGNOSIS — Z23 Encounter for immunization: Secondary | ICD-10-CM | POA: Diagnosis not present

## 2018-09-19 LAB — COMPREHENSIVE METABOLIC PANEL
ALT: 27 U/L (ref 0–35)
AST: 25 U/L (ref 0–37)
Albumin: 4.6 g/dL (ref 3.5–5.2)
Alkaline Phosphatase: 62 U/L (ref 39–117)
BUN: 11 mg/dL (ref 6–23)
CO2: 28 mEq/L (ref 19–32)
Calcium: 9.6 mg/dL (ref 8.4–10.5)
Chloride: 103 mEq/L (ref 96–112)
Creatinine, Ser: 0.6 mg/dL (ref 0.40–1.20)
GFR: 100.19 mL/min (ref >60.00)
Glucose, Bld: 99 mg/dL (ref 70–99)
Potassium: 4.5 mEq/L (ref 3.5–5.1)
Sodium: 139 mEq/L (ref 135–145)
Total Bilirubin: 0.5 mg/dL (ref 0.2–1.2)
Total Protein: 6.8 g/dL (ref 6.0–8.3)

## 2018-09-19 LAB — TSH: TSH: 0.97 u[IU]/mL (ref 0.35–4.50)

## 2018-09-19 LAB — LIPID PANEL
Cholesterol: 149 mg/dL (ref 0–200)
HDL: 50.8 mg/dL (ref >39.00)
LDL Cholesterol: 78 mg/dL (ref 0–99)
NonHDL: 98.59
Total CHOL/HDL Ratio: 3
Triglycerides: 105 mg/dL (ref 0.0–149.0)
VLDL: 21 mg/dL (ref 0.0–40.0)

## 2018-09-19 NOTE — Progress Notes (Signed)
Patient ID: Rachel Kline, female    DOB: April 28, 1955  Age: 63 y.o. MRN: [id number]    Subjective:  Subjective  HPI Rachel Kline presents for f/u chol and thyroid Pt has no complaints.    Review of Systems  Constitutional: Negative for appetite change, diaphoresis, fatigue and unexpected weight change.  Eyes: Negative for pain, redness and visual disturbance.  Respiratory: Negative for cough, chest tightness, shortness of breath and wheezing.   Cardiovascular: Negative for chest pain, palpitations and leg swelling.  Endocrine: Negative for cold intolerance, heat intolerance, polydipsia, polyphagia and polyuria.  Genitourinary: Negative for difficulty urinating, dysuria and frequency.  Neurological: Negative for dizziness, light-headedness, numbness and headaches.    History Past Medical History:  Diagnosis Date  . Contact lens/glasses fitting    wears contacts or glasses  . Hyperlipidemia   . Macular degeneration 07/02/2012    She has a past surgical history that includes Ganglion cyst excision (6/99); Colonoscopy; and Tonsillectomy (N/A, 11/22/2012).   Her family history includes Coronary artery disease in an other family member; Diabetes in her mother; Heart disease in her father; Heart failure in her father; Hyperlipidemia in her brother; Hypertension in her brother and father; Kidney failure in her father; Pancreatic cancer in her mother.She reports that she has never smoked. She has never used smokeless tobacco. She reports current alcohol use. She reports that she does not use drugs.  Current Outpatient Medications on File Prior to Visit  Medication Sig Dispense Refill  . aspirin 81 MG EC tablet Take 81 mg by mouth daily.     Marland Kitchen atorvastatin (LIPITOR) 20 MG tablet Take 1 tablet (20 mg total) by mouth daily. 90 tablet 1  . Calcium-Vitamin D-Vitamin K 500-500-40 MG-UNT-MCG CHEW 1 po tid 60 tablet   . cetirizine (ZYRTEC) 10 MG tablet Take 10 mg by mouth daily.      .  Coenzyme Q10 (COQ10) 30 MG CAPS Take by mouth 2 (two) times daily.      Marland Kitchen levothyroxine (SYNTHROID) 50 MCG tablet TAKE 1 TABLET BY MOUTH ONCE DAILY 90 tablet 1  . Multiple Vitamins-Iron (MULTIVITAMINS WITH IRON) TABS Take 1 tablet by mouth daily.  0  . Multiple Vitamins-Minerals (ICAPS AREDS 2) CAPS Take 2 capsules by mouth daily.    . Omega-3 350 MG CAPS Take by mouth.       No current facility-administered medications on file prior to visit.      Objective:  Objective  Physical Exam Vitals signs and nursing note reviewed.  Constitutional:      Appearance: She is well-developed.  HENT:     Head: Normocephalic and atraumatic.  Eyes:     Conjunctiva/sclera: Conjunctivae normal.  Neck:     Musculoskeletal: Normal range of motion and neck supple.     Thyroid: No thyromegaly.     Vascular: No carotid bruit or JVD.  Cardiovascular:     Rate and Rhythm: Normal rate and regular rhythm.     Heart sounds: Normal heart sounds. No murmur.  Pulmonary:     Effort: Pulmonary effort is normal. No respiratory distress.     Breath sounds: Normal breath sounds. No wheezing or rales.  Chest:     Chest wall: No tenderness.  Neurological:     Mental Status: She is alert and oriented to person, place, and time.    BP 138/80 (BP Location: Right Arm, Patient Position: Sitting, Cuff Size: Large)   Pulse 76   Temp 97.8 F (36.6 C) (Temporal)  Resp 18   Ht 5' 3.5" (1.613 m)   Wt 171 lb 12.8 oz (77.9 kg)   SpO2 98%   BMI 29.96 kg/m  Wt Readings from Last 3 Encounters:  09/19/18 171 lb 12.8 oz (77.9 kg)  03/18/18 170 lb (77.1 kg)  07/05/17 173 lb 6.4 oz (78.7 kg)     Lab Results  Component Value Date   WBC 6.2 03/18/2018   HGB 14.4 03/18/2018   HCT 42.1 03/18/2018   PLT 242.0 03/18/2018   GLUCOSE 99 09/19/2018   CHOL 149 09/19/2018   TRIG 105.0 09/19/2018   HDL 50.80 09/19/2018   LDLDIRECT 149.7 03/31/2009   LDLCALC 78 09/19/2018   ALT 27 09/19/2018   AST 25 09/19/2018   NA 139  09/19/2018   K 4.5 09/19/2018   CL 103 09/19/2018   CREATININE 0.60 09/19/2018   BUN 11 09/19/2018   CO2 28 09/19/2018   TSH 0.97 09/19/2018   MICROALBUR 0.2 07/15/2012    No results found.   Assessment & Plan:  Plan  I am having Rachel Kline "Rachel Kline" maintain her aspirin, cetirizine, CoQ10, Omega-3, multivitamins with iron, Calcium-Vitamin D-Vitamin K, ICaps Areds 2, levothyroxine, and atorvastatin.  No orders of the defined types were placed in this encounter.   Problem List Items Addressed This Visit      Unprioritized   Hyperlipidemia    Tolerating statin, encouraged heart healthy diet, avoid trans fats, minimize simple carbs and saturated fats. Increase exercise as tolerated      Relevant Orders   Lipid panel (Completed)   Comprehensive metabolic panel (Completed)   Hypothyroidism    Stable Check labs       Relevant Orders   TSH (Completed)   Comprehensive metabolic panel (Completed)    Other Visit Diagnoses    Need for influenza vaccination    -  Primary   Relevant Orders   Flu Vaccine QUAD High Dose(Fluad) (Completed)   Need for vaccination       Relevant Orders   Pneumococcal conjugate vaccine 13-valent IM (Completed)      Follow-up: Return in about 6 months (around 03/19/2019) for annual exam, fasting.  [name], DO

## 2018-09-19 NOTE — Patient Instructions (Signed)
Cholesterol Content in Foods Cholesterol is a waxy, fat-like substance that helps to carry fat in the blood. The body needs cholesterol in small amounts, but too much cholesterol can cause damage to the arteries and heart. Most people should eat less than 200 milligrams (mg) of cholesterol a day. Foods with cholesterol  Cholesterol is found in animal-based foods, such as meat, seafood, and dairy. Generally, low-fat dairy and lean meats have less cholesterol than full-fat dairy and fatty meats. The milligrams of cholesterol per serving (mg per serving) of common cholesterol-containing foods are listed below. Meat and other proteins  Egg - one large whole egg has 186 mg.  Veal shank - 4 oz has 141 mg.  Lean ground turkey (93% lean) - 4 oz has 118 mg.  Fat-trimmed lamb loin - 4 oz has 106 mg.  Lean ground beef (90% lean) - 4 oz has 100 mg.  Lobster - 3.5 oz has 90 mg.  Pork loin chops - 4 oz has 86 mg.  Canned salmon - 3.5 oz has 83 mg.  Fat-trimmed beef top loin - 4 oz has 78 mg.  Frankfurter - 1 frank (3.5 oz) has 77 mg.  Crab - 3.5 oz has 71 mg.  Roasted chicken without skin, white meat - 4 oz has 66 mg.  Light bologna - 2 oz has 45 mg.  Deli-cut turkey - 2 oz has 31 mg.  Canned tuna - 3.5 oz has 31 mg.  Bacon - 1 oz has 29 mg.  Oysters and mussels (raw) - 3.5 oz has 25 mg.  Mackerel - 1 oz has 22 mg.  Trout - 1 oz has 20 mg.  Pork sausage - 1 link (1 oz) has 17 mg.  Salmon - 1 oz has 16 mg.  Tilapia - 1 oz has 14 mg. Dairy  Soft-serve ice cream -  cup (4 oz) has 103 mg.  Whole-milk yogurt - 1 cup (8 oz) has 29 mg.  Cheddar cheese - 1 oz has 28 mg.  American cheese - 1 oz has 28 mg.  Whole milk - 1 cup (8 oz) has 23 mg.  2% milk - 1 cup (8 oz) has 18 mg.  Cream cheese - 1 tablespoon (Tbsp) has 15 mg.  Cottage cheese -  cup (4 oz) has 14 mg.  Low-fat (1%) milk - 1 cup (8 oz) has 10 mg.  Sour cream - 1 Tbsp has 8.5 mg.  Low-fat yogurt - 1 cup  (8 oz) has 8 mg.  Nonfat Greek yogurt - 1 cup (8 oz) has 7 mg.  Half-and-half cream - 1 Tbsp has 5 mg. Fats and oils  Cod liver oil - 1 tablespoon (Tbsp) has 82 mg.  Butter - 1 Tbsp has 15 mg.  Lard - 1 Tbsp has 14 mg.  Bacon grease - 1 Tbsp has 14 mg.  Mayonnaise - 1 Tbsp has 5-10 mg.  Margarine - 1 Tbsp has 3-10 mg. Exact amounts of cholesterol in these foods may vary depending on specific ingredients and brands. Foods without cholesterol Most plant-based foods do not have cholesterol unless you combine them with a food that has cholesterol. Foods without cholesterol include:  Grains and cereals.  Vegetables.  Fruits.  Vegetable oils, such as olive, canola, and sunflower oil.  Legumes, such as peas, beans, and lentils.  Nuts and seeds.  Egg whites. Summary  The body needs cholesterol in small amounts, but too much cholesterol can cause damage to the arteries and heart.    Most people should eat less than 200 milligrams (mg) of cholesterol a day. This information is not intended to replace advice given to you by your health care provider. Make sure you discuss any questions you have with your health care provider. Document Released: 08/29/2016 Document Revised: 12/15/2016 Document Reviewed: 08/29/2016 Elsevier Patient Education  2020 Else[employer] Inc.  

## 2018-09-19 NOTE — Assessment & Plan Note (Signed)
Stable Check labs 

## 2018-09-19 NOTE — Assessment & Plan Note (Signed)
Tolerating statin, encouraged heart healthy diet, avoid trans fats, minimize simple carbs and saturated fats. Increase exercise as tolerated 

## 2018-10-03 ENCOUNTER — Encounter (INDEPENDENT_AMBULATORY_CARE_PROVIDER_SITE_OTHER): Payer: No Typology Code available for payment source | Admitting: Ophthalmology

## 2018-10-03 ENCOUNTER — Other Ambulatory Visit: Payer: Self-pay

## 2018-10-03 DIAGNOSIS — H43813 Vitreous degeneration, bilateral: Secondary | ICD-10-CM

## 2018-10-03 DIAGNOSIS — H2513 Age-related nuclear cataract, bilateral: Secondary | ICD-10-CM

## 2018-10-03 DIAGNOSIS — H353231 Exudative age-related macular degeneration, bilateral, with active choroidal neovascularization: Secondary | ICD-10-CM | POA: Diagnosis not present

## 2018-10-31 ENCOUNTER — Encounter (INDEPENDENT_AMBULATORY_CARE_PROVIDER_SITE_OTHER): Payer: No Typology Code available for payment source | Admitting: Ophthalmology

## 2018-10-31 DIAGNOSIS — H2513 Age-related nuclear cataract, bilateral: Secondary | ICD-10-CM | POA: Diagnosis not present

## 2018-10-31 DIAGNOSIS — H353231 Exudative age-related macular degeneration, bilateral, with active choroidal neovascularization: Secondary | ICD-10-CM | POA: Diagnosis not present

## 2018-10-31 DIAGNOSIS — H43813 Vitreous degeneration, bilateral: Secondary | ICD-10-CM | POA: Diagnosis not present

## 2018-11-12 ENCOUNTER — Other Ambulatory Visit: Payer: Self-pay | Admitting: Family Medicine

## 2018-11-12 DIAGNOSIS — E038 Other specified hypothyroidism: Secondary | ICD-10-CM

## 2018-11-14 MED FILL — LEVOTHYROXINE 50 MCG TABLET: 50 | 90 days supply | Qty: 90 | Fill #0

## 2018-11-14 NOTE — Telephone Encounter (Signed)
Pt called in to check the status of this refill?   °

## 2018-11-28 ENCOUNTER — Encounter (INDEPENDENT_AMBULATORY_CARE_PROVIDER_SITE_OTHER): Payer: No Typology Code available for payment source | Admitting: Ophthalmology

## 2018-11-28 DIAGNOSIS — H353231 Exudative age-related macular degeneration, bilateral, with active choroidal neovascularization: Secondary | ICD-10-CM

## 2018-11-28 DIAGNOSIS — H43813 Vitreous degeneration, bilateral: Secondary | ICD-10-CM

## 2018-12-01 ENCOUNTER — Encounter: Payer: Self-pay | Admitting: Family Medicine

## 2018-12-01 ENCOUNTER — Telehealth: Payer: No Typology Code available for payment source | Admitting: Physician Assistant

## 2018-12-01 DIAGNOSIS — J019 Acute sinusitis, unspecified: Secondary | ICD-10-CM

## 2018-12-01 MED ORDER — AMOXICILLIN-POT CLAVULANATE 875-125 MG PO TABS: 1.0000 | ORAL_TABLET | Freq: Two times a day (BID) | ORAL | 0 refills | Status: DC

## 2018-12-01 NOTE — Progress Notes (Signed)
Updated information given from patient. Updated care plan sent.

## 2018-12-01 NOTE — Progress Notes (Signed)
I have spent 5 minutes in review of e-visit questionnaire, review and updating patient chart, medical decision making and response to patient.   Dustee Bottenfield Cody Shameka Aggarwal, PA-C    

## 2018-12-01 NOTE — Addendum Note (Signed)
Addended by: Brunetta Jeans on: 12/01/2018 01:13 PM   Modules accepted: Orders

## 2018-12-01 NOTE — Progress Notes (Signed)
Based on what you shared with me, I feel your condition warrants further evaluation and I recommend that you be seen for a face to face visit.  Please contact your primary care physician practice to be seen. Many offices offer virtual options to be seen via video if you are not comfortable going in person to a medical facility at this time. There is also the option of video visits (see below) or you can be seen at a local urgent care. It sounds like you have a sinus infection but giving the bloody discharge from the ear you need further assessment.  If you do not have a PCP, [facility] offers a free physician referral service available at [phone]. Our trained staff has the experience, knowledge and resources to put you in touch with a physician who is right for you.   You also have the option of a video visit through [email or web address]  If you are having a true medical emergency please call 911.  NOTE: If you entered your credit card information for this eVisit, you will not be charged. You may see a "hold" on your card for the $35 but that hold will drop off and you will not have a charge processed.  Your e-visit answers were reviewed by a board certified advanced clinical practitioner to complete your personal care plan.  Thank you for using e-Visits.

## 2018-12-01 NOTE — Progress Notes (Signed)

## 2018-12-02 ENCOUNTER — Encounter: Payer: Self-pay | Admitting: Family Medicine

## 2018-12-02 MED FILL — AMOX-CLAV 875-125 MG TABLET: 875-125 | 7 days supply | Qty: 14 | Fill #0

## 2018-12-04 ENCOUNTER — Telehealth: Payer: No Typology Code available for payment source | Admitting: Nurse Practitioner

## 2018-12-04 DIAGNOSIS — R21 Rash and other nonspecific skin eruption: Secondary | ICD-10-CM

## 2018-12-04 MED ORDER — PREDNISONE 10 MG (21) PO TBPK: ORAL_TABLET | ORAL | 0 refills | Status: DC

## 2018-12-04 MED FILL — predniSONE 10 MG TABS: 10 | 6 days supply | Qty: 21 | Fill #0

## 2018-12-04 NOTE — Progress Notes (Signed)
E Visit for Rash  We are sorry that you are not feeling well. Here is how we plan to help!  Based on what you shared with me it looks like you have contact dermatitis.  Contact dermatitis is a skin rash caused by something that touches the skin and causes irritation or inflammation.  Your skin may be red, swollen, dry, cracked, and itch.  The rash should go away in a few days but can last a few weeks.  If you get a rash, it's important to figure out what caused it so the irritant can be avoided in the future.      Prednisone 10 mg daily for 6 days (see taper instructions below)  Directions for 6 day taper: Day 1: 2 tablets before breakfast, 1 after both lunch & dinner and 2 at bedtime Day 2: 1 tab before breakfast, 1 after both lunch & dinner and 2 at bedtime Day 3: 1 tab at each meal & 1 at bedtime Day 4: 1 tab at breakfast, 1 at lunch, 1 at bedtime Day 5: 1 tab at breakfast & 1 tab at bedtime Day 6: 1 tab at breakfast    HOME CARE:   Take cool showers and avoid direct sunlight.  Apply cool compress or wet dressings.  Take a bath in an oatmeal bath.  Sprinkle content of one Aveeno packet under running faucet with comfortably warm water.  Bathe for 15-20 minutes, 1-2 times daily.  Pat dry with a towel. Do not rub the rash.  Use hydrocortisone cream.  Take an antihistamine like Benadryl for widespread rashes that itch.  The adult dose of Benadryl is 25-50 mg by mouth 4 times daily.  Caution:  This type of medication may cause sleepiness.  Do not drink alcohol, drive, or operate dangerous machinery while taking antihistamines.  Do not take these medications if you have prostate enlargement.  Read package instructions thoroughly on all medications that you take.  GET HELP RIGHT AWAY IF:   Symptoms don't go away after treatment.  Severe itching that persists.  If you rash spreads or swells.  If you rash begins to smell.  If it blisters and opens or develops a yellow-brown  crust.  You develop a fever.  You have a sore throat.  You become short of breath.  MAKE SURE YOU:  Understand these instructions. Will watch your condition. Will get help right away if you are not doing well or get worse.  Thank you for choosing an e-visit. Your e-visit answers were reviewed by a board certified advanced clinical practitioner to complete your personal care plan. Depending upon the condition, your plan could have included both over the counter or prescription medications. Please review your pharmacy choice. Be sure that the pharmacy you have chosen is open so that you can pick up your prescription now.  If there is a problem you may message your provider in MyChart to have the prescription routed to another pharmacy. Your safety is important to us. If you have drug allergies check your prescription carefully.   For the next 24 hours, you can use MyChart to ask questions about today's visit, request a non-urgent call back, or ask for a work or school excuse from your e-visit provider. You will get an email in the next two days asking about your experience. I hope that your e-visit has been valuable and will speed your recovery.    5-10 minutes spent reviewing and documenting in chart.   

## 2018-12-18 ENCOUNTER — Other Ambulatory Visit: Payer: Self-pay | Admitting: Family Medicine

## 2018-12-18 ENCOUNTER — Telehealth: Payer: No Typology Code available for payment source | Admitting: Family

## 2018-12-18 DIAGNOSIS — L239 Allergic contact dermatitis, unspecified cause: Secondary | ICD-10-CM

## 2018-12-18 DIAGNOSIS — E785 Hyperlipidemia, unspecified: Secondary | ICD-10-CM

## 2018-12-18 MED ORDER — PREDNISONE 10 MG (21) PO TBPK: ORAL_TABLET | ORAL | 0 refills | Status: DC

## 2018-12-18 MED FILL — ATORVASTATIN 20 MG TABLET: 20 | 90 days supply | Qty: 90 | Fill #0

## 2018-12-18 MED FILL — predniSONE 10 MG TABS: 10 | 6 days supply | Qty: 21 | Fill #0

## 2018-12-18 NOTE — Progress Notes (Signed)
E Visit for Rash  We are sorry that you are not feeling well. Here is how we plan to help!  Based on what you shared with me it looks like you have contact dermatitis.  Contact dermatitis is a skin rash caused by something that touches the skin and causes irritation or inflammation.  Your skin may be red, swollen, dry, cracked, and itch.  The rash should go away in a few days but can last a few weeks.  If you get a rash, it's important to figure out what caused it so the irritant can be avoided in the future.    It looks like you were just treated with prednisone last month, if you symptoms do not improve or worsen you need to be seen face to face.    Prednisone 10 mg daily for 6 days (see taper instructions below)  Directions for 6 day taper: Day 1: 2 tablets before breakfast, 1 after both lunch & dinner and 2 at bedtime Day 2: 1 tab before breakfast, 1 after both lunch & dinner and 2 at bedtime Day 3: 1 tab at each meal & 1 at bedtime Day 4: 1 tab at breakfast, 1 at lunch, 1 at bedtime Day 5: 1 tab at breakfast & 1 tab at bedtime Day 6: 1 tab at breakfast    HOME CARE:   Take cool showers and avoid direct sunlight.  Apply cool compress or wet dressings.  Take a bath in an oatmeal bath.  Sprinkle content of one Aveeno packet under running faucet with comfortably warm water.  Bathe for 15-20 minutes, 1-2 times daily.  Pat dry with a towel. Do not rub the rash.  Use hydrocortisone cream.  Take an antihistamine like Benadryl for widespread rashes that itch.  The adult dose of Benadryl is 25-50 mg by mouth 4 times daily.  Caution:  This type of medication may cause sleepiness.  Do not drink alcohol, drive, or operate dangerous machinery while taking antihistamines.  Do not take these medications if you have prostate enlargement.  Read package instructions thoroughly on all medications that you take.  GET HELP RIGHT AWAY IF:   Symptoms don't go away after treatment.  Severe  itching that persists.  If you rash spreads or swells.  If you rash begins to smell.  If it blisters and opens or develops a yellow-brown crust.  You develop a fever.  You have a sore throat.  You become short of breath.  MAKE SURE YOU:  Understand these instructions. Will watch your condition. Will get help right away if you are not doing well or get worse.  Thank you for choosing an e-visit. Your e-visit answers were reviewed by a board certified advanced clinical practitioner to complete your personal care plan. Depending upon the condition, your plan could have included both over the counter or prescription medications. Please review your pharmacy choice. Be sure that the pharmacy you have chosen is open so that you can pick up your prescription now.  If there is a problem you may message your provider in [facility] to have the prescription routed to another pharmacy. Your safety is important to [place]. If you have drug allergies check your prescription carefully.  For the next 24 hours, you can use MyChart to ask questions about today's visit, request a non-urgent call back, or ask for a work or school excuse from your e-visit provider. You will get an email in the next two days asking about your experience. I  hope that your e-visit has been valuable and will speed your recovery.     Approximately 5 minutes was spent documenting and reviewing patient's chart.

## 2018-12-26 ENCOUNTER — Encounter (INDEPENDENT_AMBULATORY_CARE_PROVIDER_SITE_OTHER): Payer: No Typology Code available for payment source | Admitting: Ophthalmology

## 2018-12-26 ENCOUNTER — Other Ambulatory Visit: Payer: Self-pay

## 2018-12-26 DIAGNOSIS — H43813 Vitreous degeneration, bilateral: Secondary | ICD-10-CM

## 2018-12-26 DIAGNOSIS — H353231 Exudative age-related macular degeneration, bilateral, with active choroidal neovascularization: Secondary | ICD-10-CM

## 2018-12-26 DIAGNOSIS — H2513 Age-related nuclear cataract, bilateral: Secondary | ICD-10-CM

## 2019-01-30 ENCOUNTER — Encounter (INDEPENDENT_AMBULATORY_CARE_PROVIDER_SITE_OTHER): Payer: No Typology Code available for payment source | Admitting: Ophthalmology

## 2019-01-30 DIAGNOSIS — H353231 Exudative age-related macular degeneration, bilateral, with active choroidal neovascularization: Secondary | ICD-10-CM

## 2019-01-30 DIAGNOSIS — H2512 Age-related nuclear cataract, left eye: Secondary | ICD-10-CM

## 2019-01-30 DIAGNOSIS — H43813 Vitreous degeneration, bilateral: Secondary | ICD-10-CM

## 2019-02-10 MED FILL — LEVOTHYROXINE 50 MCG TABLET: 50 | 90 days supply | Qty: 90 | Fill #1

## 2019-02-10 MED FILL — PREVIDENT 5000 1.1% DRY MOU: 1.1 | 30 days supply | Qty: 100 | Fill #1

## 2019-02-11 ENCOUNTER — Ambulatory Visit: Payer: No Typology Code available for payment source

## 2019-02-11 MED FILL — BESIVANCE 0.6% SUSP: 0.6 | 14 days supply | Qty: 5 | Fill #0

## 2019-02-28 ENCOUNTER — Ambulatory Visit: Payer: No Typology Code available for payment source

## 2019-03-03 MED FILL — VALACYCLOVIR HCL 500 MG TAB: 500 | 7 days supply | Qty: 14 | Fill #0

## 2019-03-05 ENCOUNTER — Other Ambulatory Visit: Payer: Self-pay

## 2019-03-05 ENCOUNTER — Encounter (INDEPENDENT_AMBULATORY_CARE_PROVIDER_SITE_OTHER): Payer: No Typology Code available for payment source | Admitting: Ophthalmology

## 2019-03-05 DIAGNOSIS — H43813 Vitreous degeneration, bilateral: Secondary | ICD-10-CM

## 2019-03-05 DIAGNOSIS — H353231 Exudative age-related macular degeneration, bilateral, with active choroidal neovascularization: Secondary | ICD-10-CM

## 2019-03-05 DIAGNOSIS — H2513 Age-related nuclear cataract, bilateral: Secondary | ICD-10-CM

## 2019-03-25 MED FILL — ATORVASTATIN 20 MG TABLET: 20 | 90 days supply | Qty: 90 | Fill #1

## 2019-04-02 ENCOUNTER — Other Ambulatory Visit: Payer: Self-pay

## 2019-04-03 ENCOUNTER — Encounter: Payer: No Typology Code available for payment source | Admitting: Family Medicine

## 2019-04-09 ENCOUNTER — Encounter (INDEPENDENT_AMBULATORY_CARE_PROVIDER_SITE_OTHER): Payer: No Typology Code available for payment source | Admitting: Ophthalmology

## 2019-04-09 DIAGNOSIS — H353231 Exudative age-related macular degeneration, bilateral, with active choroidal neovascularization: Secondary | ICD-10-CM

## 2019-04-09 DIAGNOSIS — H43813 Vitreous degeneration, bilateral: Secondary | ICD-10-CM

## 2019-04-11 ENCOUNTER — Encounter (INDEPENDENT_AMBULATORY_CARE_PROVIDER_SITE_OTHER): Payer: No Typology Code available for payment source | Admitting: Ophthalmology

## 2019-04-23 ENCOUNTER — Other Ambulatory Visit: Payer: Self-pay

## 2019-04-24 ENCOUNTER — Other Ambulatory Visit: Payer: Self-pay

## 2019-04-24 ENCOUNTER — Ambulatory Visit (INDEPENDENT_AMBULATORY_CARE_PROVIDER_SITE_OTHER): Payer: No Typology Code available for payment source | Admitting: Family Medicine

## 2019-04-24 ENCOUNTER — Encounter: Payer: Self-pay | Admitting: Family Medicine

## 2019-04-24 VITALS — BP 140/84 | HR 74 | Temp 97.8°F | Resp 18 | Ht 63.5 in | Wt 177.0 lb

## 2019-04-24 DIAGNOSIS — E2839 Other primary ovarian failure: Secondary | ICD-10-CM

## 2019-04-24 DIAGNOSIS — Z0000 Encounter for general adult medical examination without abnormal findings: Secondary | ICD-10-CM | POA: Diagnosis not present

## 2019-04-24 DIAGNOSIS — D229 Melanocytic nevi, unspecified: Secondary | ICD-10-CM

## 2019-04-24 DIAGNOSIS — E039 Hypothyroidism, unspecified: Secondary | ICD-10-CM

## 2019-04-24 DIAGNOSIS — E785 Hyperlipidemia, unspecified: Secondary | ICD-10-CM | POA: Diagnosis not present

## 2019-04-24 LAB — COMPREHENSIVE METABOLIC PANEL
ALT: 25 U/L (ref 0–35)
AST: 26 U/L (ref 0–37)
Albumin: 4.7 g/dL (ref 3.5–5.2)
Alkaline Phosphatase: 62 U/L (ref 39–117)
BUN: 9 mg/dL (ref 6–23)
CO2: 27 mEq/L (ref 19–32)
Calcium: 9.4 mg/dL (ref 8.4–10.5)
Chloride: 101 mEq/L (ref 96–112)
Creatinine, Ser: 0.6 mg/dL (ref 0.40–1.20)
GFR: 100 mL/min (ref >60.00)
Glucose, Bld: 95 mg/dL (ref 70–99)
Potassium: 4.1 mEq/L (ref 3.5–5.1)
Sodium: 137 mEq/L (ref 135–145)
Total Bilirubin: 0.5 mg/dL (ref 0.2–1.2)
Total Protein: 6.6 g/dL (ref 6.0–8.3)

## 2019-04-24 LAB — LIPID PANEL
Cholesterol: 174 mg/dL (ref 0–200)
HDL: 54.7 mg/dL (ref >39.00)
LDL Cholesterol: 96 mg/dL (ref 0–99)
NonHDL: 119.01
Total CHOL/HDL Ratio: 3
Triglycerides: 113 mg/dL (ref 0.0–149.0)
VLDL: 22.6 mg/dL (ref 0.0–40.0)

## 2019-04-24 LAB — TSH: TSH: 0.86 u[IU]/mL (ref 0.35–4.50)

## 2019-04-24 NOTE — Progress Notes (Signed)
Subjective:     Rachel Kline is a 64 y.o. female and is here for a comprehensive physical exam. The patient reports no problems  Also need f/u bp , cholesterol .  Social History   Socioeconomic History  . Marital status: Married    Spouse name: Not on file  . Number of children: Not on file  . Years of education: Not on file  . Highest education level: Not on file  Occupational History  . Occupation: Optician, dispensing: [employer]  . 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 Smoking status: Never Smoker  . Smokeless tobacco: Never Used  Substance and Sexual Activity  . Alcohol use: Yes    Comment: rare  . Drug use: No  . Sexual activity: Yes    Partners: Male  Other Topics Concern  . Not on file  Social History Narrative   Exercise-- 30 min daily    Social Determinants of Health   Financial Resource Strain:   . Difficulty of Paying Living Expenses:   Food Insecurity:   . Worried About Charity fundraiser in the Last Year:   . Arboriculturist in the Last Year:   Transportation Needs:   . Film/video editor (Medical):   Marland Kitchen Lack of Transportation (Non-Medical):   Physical Activity:   . Days of Exercise per Week:   . Minutes of Exercise per Session:   Stress:   . Feeling of Stress :   Social Connections:   . Frequency of Communication with Friends and Family:   . Frequency of Social Gatherings with Friends and Family:   . Attends Religious Services:   . Active Member of Clubs or Organizations:   . Attends Archivist Meetings:   Marland Kitchen Marital Status:   Intimate Partner Violence:   . Fear of Current or Ex-Partner:   . Emotionally Abused:   Marland Kitchen Physically Abused:   . Sexually Abused:    Health Maintenance  Topic Date Due  . MAMMOGRAM  11/09/2018  . INFLUENZA VACCINE  08/17/2019  . PNA vac Low Risk Adult (2 of 2 - PPSV23) 09/19/2019  . COLONOSCOPY  11/03/2022  . TETANUS/TDAP  05/16/2026  .  DEXA SCAN  Completed  . Hepatitis C Screening  Completed    The following portions of the patient's history were reviewed and updated as appropriate:  She  has a past medical history of Contact lens/glasses fitting, Hyperlipidemia, and Macular degeneration (07/02/2012). She does not have any pertinent problems on file. She  has a past surgical history that includes Ganglion cyst excision (6/99); Colonoscopy; and Tonsillectomy (N/A, 11/22/2012). Her family history includes Coronary artery disease in an other family member; Diabetes in her mother; Heart disease in her father; Heart failure in her father; Hyperlipidemia in her brother; Hypertension in her brother and father; Kidney failure in her father; Pancreatic cancer in her mother. She  reports that she has never smoked. She has never used smokeless tobacco. She reports current alcohol use. She reports that she does not use drugs. She has a current medication list which includes the following prescription(s): aspirin, atorvastatin, calcium-vitamin d-vitamin k, cetirizine, coq10, levothyroxine, multivitamins with iron, icaps areds 2, and omega-3. Current Outpatient Medications on File Prior to Visit  Medication Sig Dispense Refill  . aspirin 81 MG EC tablet Take 81 mg by mouth daily.     Marland Kitchen atorvastatin (LIPITOR) 20 MG tablet  TAKE 1 TABLET (20 MG TOTAL) BY MOUTH DAILY. 90 tablet 1  . Calcium-Vitamin D-Vitamin K 500-500-40 MG-UNT-MCG CHEW 1 po tid 60 tablet   . cetirizine (ZYRTEC) 10 MG tablet Take 10 mg by mouth daily.      . Coenzyme Q10 (COQ10) 30 MG CAPS Take by mouth 2 (two) times daily.      Marland Kitchen levothyroxine (SYNTHROID) 50 MCG tablet TAKE 1 TABLET BY MOUTH ONCE DAILY 90 tablet 1  . Multiple Vitamins-Iron (MULTIVITAMINS WITH IRON) TABS Take 1 tablet by mouth daily.  0  . Multiple Vitamins-Minerals (ICAPS AREDS 2) CAPS Take 2 capsules by mouth daily.    . Omega-3 350 MG CAPS Take by mouth.       No current facility-administered medications on  file prior to visit.   She has No Known Allergies..  Review of Systems  Review of Systems  Constitutional: Negative for activity change, appetite change and fatigue.  HENT: Negative for hearing loss, congestion, tinnitus and ear discharge.   Eyes: Negative for visual disturbance (see optho q1y -- vision corrected to 20/20 with glasses).  Respiratory: Negative for cough, chest tightness and shortness of breath.   Cardiovascular: Negative for chest pain, palpitations and leg swelling.  Gastrointestinal: Negative for abdominal pain, diarrhea, constipation and abdominal distention.  Genitourinary: Negative for urgency, frequency, decreased urine volume and difficulty urinating.  Musculoskeletal: Negative for back pain, arthralgias and gait problem.  Skin: Negative for color change, pallor and rash.  Neurological: Negative for dizziness, light-headedness, numbness and headaches.  Hematological: Negative for adenopathy. Does not bruise/bleed easily.  Psychiatric/Behavioral: Negative for suicidal ideas, confusion, sleep disturbance, self-injury, dysphoric mood, decreased concentration and agitation.  Pt is able to read and write and can do all ADLs No risk for falling No abuse/ violence in home     Objective:    BP 140/84 (BP Location: Right Arm, Patient Position: Sitting, Cuff Size: Normal)   Pulse 74   Temp 97.8 F (36.6 C) (Temporal)   Resp 18   Ht 5' 3.5" (1.613 m)   Wt 177 lb (80.3 kg)   SpO2 98%   BMI 30.86 kg/m  General appearance: alert, cooperative, appears stated age and no distress Head: Normocephalic, without obvious abnormality, atraumatic Eyes: conjunctivae/corneas clear. PERRL, EOM's intact. Fundi benign. Ears: normal TM's and external ear canals both ears Neck: no adenopathy, no carotid bruit, no JVD, supple, symmetrical, trachea midline and thyroid not enlarged, symmetric, no tenderness/mass/nodules Back: symmetric, no curvature. ROM normal. No CVA tenderness.   Skin-- + sk on back and errythematous spot on bridge of nose  Lungs: clear to auscultation bilaterally Breasts: gyn Heart: regular rate and rhythm, S1, S2 normal, no murmur, click, rub or gallop Abdomen: soft, non-tender; bowel sounds normal; no masses,  no organomegaly Pelvic: deferred Extremities: extremities normal, atraumatic, no cyanosis or edema Pulses: 2+ and symmetric Skin: Skin color, texture, turgor normal. No rashes or lesions Lymph nodes: Cervical, supraclavicular, and axillary nodes normal. Neurologic: Alert and oriented X 3, normal strength and tone. Normal symmetric reflexes. Normal coordination and gait    Assessment:    Healthy female exam.      Plan:    ghm utd Check labs  See After Visit Summary for Counseling Recommendations    1. Hypothyroidism, unspecified type Check labs  - TSH  2. Hyperlipidemia, unspecified hyperlipidemia type Encouraged heart healthy diet, increase exercise, avoid trans fats, consider a krill oil cap daily - Lipid panel - Comprehensive metabolic panel  3. Estrogen  deficiency  - DG Bone Density; Future  4. Suspicious nevus  - Ambulatory referral to Dermatology  5. Preventative health care See above

## 2019-04-24 NOTE — Patient Instructions (Signed)
Preventive Care 66 Years and Older, Female Preventive care refers to lifestyle choices and visits with your health care provider that can promote health and wellness. This includes:  A yearly physical exam. This is also called an annual well check.  Regular dental and eye exams.  Immunizations.  Screening for certain conditions.  Healthy lifestyle choices, such as diet and exercise. What can I expect for my preventive care visit? Physical exam Your health care provider will check:  Height and weight. These may be used to calculate body mass index (BMI), which is a measurement that tells if you are at a healthy weight.  Heart rate and blood pressure.  Your skin for abnormal spots. Counseling Your health care provider may ask you questions about:  Alcohol, tobacco, and drug use.  Emotional well-being.  Home and relationship well-being.  Sexual activity.  Eating habits.  History of falls.  Memory and ability to understand (cognition).  Work and work Statistician.  Pregnancy and menstrual history. What immunizations do I need?  Influenza (flu) vaccine  This is recommended every year. Tetanus, diphtheria, and pertussis (Tdap) vaccine  You may need a Td booster every 10 years. Varicella (chickenpox) vaccine  You may need this vaccine if you have not already been vaccinated. Zoster (shingles) vaccine  You may need this after age 33. Pneumococcal conjugate (PCV13) vaccine  One dose is recommended after age 33. Pneumococcal polysaccharide (PPSV23) vaccine  One dose is recommended after age 72. Measles, mumps, and rubella (MMR) vaccine  You may need at least one dose of MMR if you were born in 1957 or later. You may also need a second dose. Meningococcal conjugate (MenACWY) vaccine  You may need this if you have certain conditions. Hepatitis A vaccine  You may need this if you have certain conditions or if you travel or work in places where you may be exposed  to hepatitis A. Hepatitis B vaccine  You may need this if you have certain conditions or if you travel or work in places where you may be exposed to hepatitis B. Haemophilus influenzae type b (Hib) vaccine  You may need this if you have certain conditions. You may receive vaccines as individual doses or as more than one vaccine together in one shot (combination vaccines). Talk with your health care provider about the risks and benefits of combination vaccines. What tests do I need? Blood tests  Lipid and cholesterol levels. These may be checked every 5 years, or more frequently depending on your overall health.  Hepatitis C test.  Hepatitis B test. Screening  Lung cancer screening. You may have this screening every year starting at age 39 if you have a 30-pack-year history of smoking and currently smoke or have quit within the past 15 years.  Colorectal cancer screening. All adults should have this screening starting at age 36 and continuing until age 15. Your health care provider may recommend screening at age 23 if you are at increased risk. You will have tests every 1-10 years, depending on your results and the type of screening test.  Diabetes screening. This is done by checking your blood sugar (glucose) after you have not eaten for a while (fasting). You may have this done every 1-3 years.  Mammogram. This may be done every 1-2 years. Talk with your health care provider about how often you should have regular mammograms.  BRCA-related cancer screening. This may be done if you have a family history of breast, ovarian, tubal, or peritoneal cancers.  Other tests  Sexually transmitted disease (STD) testing.  Bone density scan. This is done to screen for osteoporosis. You may have this done starting at age 50. Follow these instructions at home: Eating and drinking  Eat a diet that includes fresh fruits and vegetables, whole grains, lean protein, and low-fat dairy products. Limit  your intake of foods with high amounts of sugar, saturated fats, and salt.  Take vitamin and mineral supplements as recommended by your health care provider.  Do not drink alcohol if your health care provider tells you not to drink.  If you drink alcohol: ? Limit how much you have to 0-1 drink a day. ? Be aware of how much alcohol is in your drink. In the [place] one drink equals one 12 oz bottle of beer (355 mL), one 5 oz glass of wine (148 mL), or one 1 oz glass of hard liquor (44 mL). Lifestyle  Take daily care of your teeth and gums.  Stay active. Exercise for at least 30 minutes on 5 or more days each week.  Do not use any products that contain nicotine or tobacco, such as cigarettes, e-cigarettes, and chewing tobacco. If you need help quitting, ask your health care provider.  If you are sexually active, practice safe sex. Use a condom or other form of protection in order to prevent STIs (sexually transmitted infections).  Talk with your health care provider about taking a low-dose aspirin or statin. What's next?  Go to your health care provider once a year for a well check visit.  Ask your health care provider how often you should have your eyes and teeth checked.  Stay up to date on all vaccines. This information is not intended to replace advice given to you by your health care provider. Make sure you discuss any questions you have with your health care provider. Document Revised: 12/27/2017 Document Reviewed: 12/27/2017 Elsevier Patient Education  2020 [employer].

## 2019-04-25 LAB — HM MAMMOGRAPHY

## 2019-04-29 ENCOUNTER — Ambulatory Visit (HOSPITAL_BASED_OUTPATIENT_CLINIC_OR_DEPARTMENT_OTHER)
Admission: RE | Admit: 2019-04-29 | Discharge: 2019-04-29 | Disposition: A | Discharge status: Home / Self Care | Payer: No Typology Code available for payment source | Source: Ambulatory Visit | Attending: Family Medicine | Admitting: Family Medicine

## 2019-04-29 ENCOUNTER — Other Ambulatory Visit: Payer: Self-pay

## 2019-04-29 DIAGNOSIS — E2839 Other primary ovarian failure: Secondary | ICD-10-CM | POA: Diagnosis not present

## 2019-04-30 ENCOUNTER — Other Ambulatory Visit: Payer: Self-pay

## 2019-04-30 ENCOUNTER — Other Ambulatory Visit: Payer: Self-pay | Admitting: Family Medicine

## 2019-04-30 ENCOUNTER — Encounter: Payer: Self-pay | Admitting: Family Medicine

## 2019-04-30 DIAGNOSIS — E038 Other specified hypothyroidism: Secondary | ICD-10-CM

## 2019-04-30 MED ORDER — ALENDRONATE SODIUM 70 MG PO TABS: 70.0000 mg | ORAL_TABLET | ORAL | 2 refills | Status: DC

## 2019-04-30 MED FILL — ALENDRONATE NA 70 MG TAB: 70 | 84 days supply | Qty: 12 | Fill #0

## 2019-04-30 MED FILL — LEVOTHYROXINE 50 MCG TABLET: 50 | 90 days supply | Qty: 90 | Fill #0

## 2019-05-14 ENCOUNTER — Encounter (INDEPENDENT_AMBULATORY_CARE_PROVIDER_SITE_OTHER): Payer: No Typology Code available for payment source | Admitting: Ophthalmology

## 2019-05-14 DIAGNOSIS — H43813 Vitreous degeneration, bilateral: Secondary | ICD-10-CM | POA: Diagnosis not present

## 2019-05-14 DIAGNOSIS — H353231 Exudative age-related macular degeneration, bilateral, with active choroidal neovascularization: Secondary | ICD-10-CM | POA: Diagnosis not present

## 2019-05-14 MED FILL — TRIAMCINOLONE 0.1% CREAM: 0.1 | 21 days supply | Qty: 15 | Fill #0

## 2019-06-03 ENCOUNTER — Encounter: Payer: Self-pay | Admitting: Family Medicine

## 2019-06-23 ENCOUNTER — Other Ambulatory Visit: Payer: Self-pay | Admitting: Family Medicine

## 2019-06-23 DIAGNOSIS — E785 Hyperlipidemia, unspecified: Secondary | ICD-10-CM

## 2019-06-25 ENCOUNTER — Encounter (INDEPENDENT_AMBULATORY_CARE_PROVIDER_SITE_OTHER): Payer: No Typology Code available for payment source | Admitting: Ophthalmology

## 2019-06-25 ENCOUNTER — Other Ambulatory Visit: Payer: Self-pay

## 2019-06-25 DIAGNOSIS — H353231 Exudative age-related macular degeneration, bilateral, with active choroidal neovascularization: Secondary | ICD-10-CM

## 2019-06-25 DIAGNOSIS — H43813 Vitreous degeneration, bilateral: Secondary | ICD-10-CM

## 2019-07-13 ENCOUNTER — Telehealth: Payer: No Typology Code available for payment source | Admitting: Emergency Medicine

## 2019-07-13 DIAGNOSIS — R21 Rash and other nonspecific skin eruption: Secondary | ICD-10-CM

## 2019-07-13 DIAGNOSIS — W57XXXA Bitten or stung by nonvenomous insect and other nonvenomous arthropods, initial encounter: Secondary | ICD-10-CM | POA: Diagnosis not present

## 2019-07-13 MED ORDER — DOXYCYCLINE HYCLATE 100 MG PO TABS: 100.0000 mg | ORAL_TABLET | Freq: Two times a day (BID) | ORAL | 0 refills | Status: AC

## 2019-07-13 NOTE — Addendum Note (Signed)
Addended by: Dorise Hiss on: 07/13/2019 04:50 PM   Modules accepted: Orders

## 2019-07-13 NOTE — Addendum Note (Signed)
Addended by: Dorise Hiss on: 07/13/2019 12:57 PM   Modules accepted: Orders

## 2019-07-13 NOTE — Progress Notes (Signed)
Time spent: 10 min  Thank you for describing your tick bite, Here is how we plan to help!  Based on the information that you shared with me it looks like you have An infected or complicated tick bite that requires a longer course of antibiotics and will need for you to schedule a follow-up visit with a provider.  You described redness around the site of the tick bite that expands into a round or oval red skin lesion, sometimes the inside of the lesion clears making it look like a bull's eye.  This is called "erythema migrans" and can be an indicator of Lyme Disease if not treated appropriately.  Fever, chills, body aches, fatigue, headaches, swollen lymph nodes, neck stiffness can all be red flags that you may have developed Lyme Disease.  Monitor for these symptoms.   In most cases a tick bite is painless and does not itch.  Most tick bites in which the tick is quickly removed do not require prescriptions. Ticks can transmit several diseases if they are infected and remain attacked to your skin. Therefore the length that the tick was attached and any symptoms you have experienced after the bite are import to accurately develop your custom treatment plan. In most cases a single dose of doxycycline may prevent the development of a more serious condition.  Based on your information I have Provided a home care guide for tick bites and  instructions on when to call for help. and Your symptoms indicate that you need a longer course of antibiotics and a follow up visit with a provider. I have sent doxycycline 100 mg twice a day for 21 days to the pharmacy that you selected. You will need to schedule a follow up visit with your provider. If you do not have a primary care provider you may use our telehealth physicians on the web at [facility]  are associated with illness?  The Wood Tick (dog tick) is the size of a watermelon seed and can sometimes transmit [facility] spotted fever and  [place] tick fever.   The Deer Tick (black-legged tick) is between the size of a poppy seed (pin head) and an apple seed, and can sometimes transmit Lyme disease.  A brown to black tick with a white splotch on its back is likely a female Amblyomma americanum ([place] tick). This tick has been associated with Southern Tick Associated illness ( STARI)  Lyme disease has become the most common tick-borne illness in the [place]. The risk of Lyme disease following a recognized deer tick bite is estimated to be 1%.  The majority of cases of Lyme disease start with a bull's eye rash at the site of the tick bite. The rash can occur days to weeks (typically 7-10 days) after a tick bite. Treatment with antibiotics is indicated if this rash appears. Flu-like symptoms may accompany the rash, including: fever, chills, headaches, muscle aches, and fatigue. Removing ticks promptly may prevent tick borne disease.  What can be used to prevent Tick Bites?   Insect repellant with at leas 20% DEET.  Wearing long pants with sock and shoes.  Avoiding tall grass and heavily wooded areas.  Checking your skin after being outdoors.  Shower with a washcloth after outdoor exposures.  HOME CARE ADVICE FOR TICK BITE  1. Wood Tick Removal:  o Use a pair of tweezers and grasp the wood tick close to the skin (on its head). Pull the wood tick straight upward  without twisting or crushing it. Maintain a steady pressure until it releases its grip.   o If tweezers aren't available, use fingers, a loop of thread around the jaws, or a needle between the jaws for traction.  o Note: covering the tick with petroleum jelly, nail polish or rubbing alcohol doesn't work. Neither does touching the tick with a hot or cold object. 2. Tiny Deer Tick Removal:   o Needs to be scraped off with a knife blade or credit card edge. o Place tick in a sealed container (e.g. glass jar, zip lock plastic bag), in case your doctor wants to  see it. 3. Tick's Head Removal:  o If the wood tick's head breaks off in the skin, it must be removed. Clean the skin. Then use a sterile needle to uncover the head and lift it out or scrape it off.  o If a very small piece of the head remains, the skin will eventually slough it off. 4. Antibiotic Ointment:  o Wash the wound and your hands with soap and water after removal to prevent catching any tick disease.  Apply an over the counter antibiotic ointment (e.g. bacitracin) to the bite once. 5. Expected Course: Tick bites normally don't itch or hurt. That's why they often go unnoticed. 6. Call Your Doctor If:  o You can't remove the tick or the tick's head o Fever, a severe head ache, or rash occur in the next 2 weeks o Bite begins to look infected o Lyme's disease is common in your area o You have not had a tetanus in the last 10 years o Your current symptoms become worse    MAKE SURE YOU   Understand these instructions.  Will watch your condition.  Will get help right away if you are not doing well or get worse.   Thank you for choosing an e-visit.  Your e-visit answers were reviewed by a board certified advanced clinical practitioner to complete your personal care plan. Depending upon the condition, your plan could have included both over the counter or prescription medications. Please review your pharmacy choice. If there is a problem you may use MyChart messaging to have the prescription routed to another pharmacy. Your safety is important to [place]. If you have drug allergies check your prescription carefully.   You can use MyChart to ask questions about today's visit, request a non-urgent call back, or ask for a work or school excuse for 24 hours related to this e-Visit. If it has been greater than 24 hours you will need to follow up with your provider, or enter a new e-Visit to address those concerns.  You will get an email in the next two days asking about your experience. I  hope  that your e-visit has been valuable and will speed your recovery  NOTE: patient has not updated her pharmacy of choice.  I am unable to send prescription for doxycycline 100 mg BID for 21 days for rash suspicious of erythema migrans.  I have messaged the patient and asked her to reply back to [place] or update her pharmacy so the next provider can send the prescription in to her pharmacy

## 2019-07-18 MED FILL — ALENDRONATE NA 70 MG TAB: 70 | 84 days supply | Qty: 12 | Fill #1

## 2019-07-21 MED FILL — PREVIDENT 5000 1.1% DRY MOU: 1.1 | 30 days supply | Qty: 100 | Fill #2

## 2019-07-23 MED FILL — LEVOTHYROXINE SODIUM 50 MCG: 50 | 90 days supply | Qty: 90 | Fill #1

## 2019-07-28 ENCOUNTER — Encounter: Payer: Self-pay | Admitting: Family Medicine

## 2019-08-06 ENCOUNTER — Encounter (INDEPENDENT_AMBULATORY_CARE_PROVIDER_SITE_OTHER): Payer: No Typology Code available for payment source | Admitting: Ophthalmology

## 2019-08-06 ENCOUNTER — Other Ambulatory Visit: Payer: Self-pay

## 2019-08-06 DIAGNOSIS — H353231 Exudative age-related macular degeneration, bilateral, with active choroidal neovascularization: Secondary | ICD-10-CM | POA: Diagnosis not present

## 2019-08-06 DIAGNOSIS — H43813 Vitreous degeneration, bilateral: Secondary | ICD-10-CM | POA: Diagnosis not present

## 2019-08-06 DIAGNOSIS — H2513 Age-related nuclear cataract, bilateral: Secondary | ICD-10-CM | POA: Diagnosis not present

## 2019-09-17 MED FILL — ATORVASTATIN CALCIUM 20 MG: 20 | 90 days supply | Qty: 90 | Fill #1

## 2019-09-24 ENCOUNTER — Other Ambulatory Visit: Payer: Self-pay

## 2019-09-24 ENCOUNTER — Encounter (INDEPENDENT_AMBULATORY_CARE_PROVIDER_SITE_OTHER): Payer: No Typology Code available for payment source | Admitting: Ophthalmology

## 2019-09-24 DIAGNOSIS — H43813 Vitreous degeneration, bilateral: Secondary | ICD-10-CM

## 2019-09-24 DIAGNOSIS — H2513 Age-related nuclear cataract, bilateral: Secondary | ICD-10-CM

## 2019-09-24 DIAGNOSIS — H353231 Exudative age-related macular degeneration, bilateral, with active choroidal neovascularization: Secondary | ICD-10-CM | POA: Diagnosis not present

## 2019-10-08 MED FILL — ALENDRONATE NA 70 MG TAB: 70 | 84 days supply | Qty: 12 | Fill #2

## 2019-10-23 ENCOUNTER — Other Ambulatory Visit: Payer: Self-pay | Admitting: Family Medicine

## 2019-10-23 DIAGNOSIS — E038 Other specified hypothyroidism: Secondary | ICD-10-CM

## 2019-10-23 MED FILL — LEVOTHYROXINE SODIUM 50 MCG: 50 | 90 days supply | Qty: 90 | Fill #0

## 2019-10-24 ENCOUNTER — Other Ambulatory Visit: Payer: Self-pay

## 2019-10-24 ENCOUNTER — Ambulatory Visit (INDEPENDENT_AMBULATORY_CARE_PROVIDER_SITE_OTHER): Payer: No Typology Code available for payment source | Admitting: Family Medicine

## 2019-10-24 ENCOUNTER — Encounter: Payer: Self-pay | Admitting: Family Medicine

## 2019-10-24 VITALS — BP 128/80 | HR 67 | Temp 98.2°F | Resp 12 | Ht 63.5 in | Wt 177.2 lb

## 2019-10-24 DIAGNOSIS — R030 Elevated blood-pressure reading, without diagnosis of hypertension: Secondary | ICD-10-CM

## 2019-10-24 DIAGNOSIS — Z23 Encounter for immunization: Secondary | ICD-10-CM | POA: Diagnosis not present

## 2019-10-24 DIAGNOSIS — I10 Essential (primary) hypertension: Secondary | ICD-10-CM | POA: Diagnosis not present

## 2019-10-24 DIAGNOSIS — E785 Hyperlipidemia, unspecified: Secondary | ICD-10-CM | POA: Diagnosis not present

## 2019-10-24 DIAGNOSIS — E039 Hypothyroidism, unspecified: Secondary | ICD-10-CM

## 2019-10-24 HISTORY — DX: Essential (primary) hypertension: I10

## 2019-10-24 NOTE — Assessment & Plan Note (Signed)
Check labs con't synthroid 

## 2019-10-24 NOTE — Assessment & Plan Note (Signed)
Well controlled, no changes to meds. Encouraged heart healthy diet such as the DASH diet and exercise as tolerated.  °

## 2019-10-24 NOTE — Assessment & Plan Note (Signed)
Encouraged heart healthy diet, increase exercise, avoid trans fats, consider a krill oil cap daily con't lipitor 

## 2019-10-24 NOTE — Progress Notes (Signed)
Patient ID: Rachel Kline, female    DOB: Nov 05, 1955  Age: 64 y.o. MRN: [id number]    Subjective:  Subjective  HPI Rachel Kline presents for f//u chol and bp.   Pt has no compliaints   Review of Systems  Constitutional: Negative for appetite change, diaphoresis, fatigue and unexpected weight change.  Eyes: Negative for pain, redness and visual disturbance.  Respiratory: Negative for cough, chest tightness, shortness of breath and wheezing.   Cardiovascular: Negative for chest pain, palpitations and leg swelling.  Endocrine: Negative for cold intolerance, heat intolerance, polydipsia, polyphagia and polyuria.  Genitourinary: Negative for difficulty urinating, dysuria and frequency.  Neurological: Negative for dizziness, light-headedness, numbness and headaches.    History Past Medical History:  Diagnosis Date  . Contact lens/glasses fitting    wears contacts or glasses  . Hyperlipidemia   . Macular degeneration 07/02/2012  . Primary hypertension 10/24/2019    She has a past surgical history that includes Ganglion cyst excision (6/99); Colonoscopy; and Tonsillectomy (N/A, 11/22/2012).   Her family history includes Coronary artery disease in an other family member; Diabetes in her mother; Heart disease in her father; Heart failure in her father; Hyperlipidemia in her brother; Hypertension in her brother and father; Kidney failure in her father; Pancreatic cancer in her mother.She reports that she has never smoked. She has never used smokeless tobacco. She reports current alcohol use. She reports that she does not use drugs.  Current Outpatient Medications on File Prior to Visit  Medication Sig Dispense Refill  . alendronate (FOSAMAX) 70 MG tablet Take 1 tablet (70 mg total) by mouth every 7 (seven) days. Take with a full glass of water on an empty stomach. 12 tablet 2  . aspirin 81 MG EC tablet Take 81 mg by mouth daily.     Marland Kitchen atorvastatin (LIPITOR) 20 MG tablet TAKE 1 TABLET (20  MG TOTAL) BY MOUTH DAILY. 90 tablet 1  . Calcium-Vitamin D-Vitamin K 500-500-40 MG-UNT-MCG CHEW 1 po tid 60 tablet   . cetirizine (ZYRTEC) 10 MG tablet Take 10 mg by mouth daily.      . Coenzyme Q10 (COQ10) 30 MG CAPS Take by mouth 2 (two) times daily.      Marland Kitchen levothyroxine (SYNTHROID) 50 MCG tablet Take 1 tablet (50 mcg total) by mouth daily before breakfast. 90 tablet 1  . Multiple Vitamins-Iron (MULTIVITAMINS WITH IRON) TABS Take 1 tablet by mouth daily.  0  . Multiple Vitamins-Minerals (ICAPS AREDS 2) CAPS Take 2 capsules by mouth daily.    . Omega-3 350 MG CAPS Take by mouth.       No current facility-administered medications on file prior to visit.     Objective:  Objective  Physical Exam Vitals and nursing note reviewed.  Constitutional:      Appearance: She is well-developed.  HENT:     Head: Normocephalic and atraumatic.  Eyes:     Conjunctiva/sclera: Conjunctivae normal.  Neck:     Thyroid: No thyromegaly.     Vascular: No carotid bruit or JVD.  Cardiovascular:     Rate and Rhythm: Normal rate and regular rhythm.     Heart sounds: Normal heart sounds. No murmur heard.   Pulmonary:     Effort: Pulmonary effort is normal. No respiratory distress.     Breath sounds: Normal breath sounds. No wheezing or rales.  Chest:     Chest wall: No tenderness.  Musculoskeletal:     Cervical back: Normal range of motion and neck  supple.  Neurological:     Mental Status: She is alert and oriented to person, place, and time.    BP 128/80 (BP Location: Right Arm, Cuff Size: Large)   Pulse 67   Temp 98.2 F (36.8 C) (Oral)   Resp 12   Ht 5' 3.5" (1.613 m)   Wt 177 lb 3.2 oz (80.4 kg)   SpO2 95%   BMI 30.90 kg/m  Wt Readings from Last 3 Encounters:  10/24/19 177 lb 3.2 oz (80.4 kg)  04/24/19 177 lb (80.3 kg)  09/19/18 171 lb 12.8 oz (77.9 kg)     Lab Results  Component Value Date   WBC 6.2 03/18/2018   HGB 14.4 03/18/2018   HCT 42.1 03/18/2018   PLT 242.0 03/18/2018    GLUCOSE 95 04/24/2019   CHOL 174 04/24/2019   TRIG 113.0 04/24/2019   HDL 54.70 04/24/2019   LDLDIRECT 149.7 03/31/2009   LDLCALC 96 04/24/2019   ALT 25 04/24/2019   AST 26 04/24/2019   NA 137 04/24/2019   K 4.1 04/24/2019   CL 101 04/24/2019   CREATININE 0.60 04/24/2019   BUN 9 04/24/2019   CO2 27 04/24/2019   TSH 0.86 04/24/2019   MICROALBUR 0.2 07/15/2012    DG Bone Density  Result Date: 04/30/2019 EXAM: DUAL X-RAY ABSORPTIOMETRY (DXA) FOR BONE MINERAL DENSITY IMPRESSION: Alferd [facility] [place] [facility] Your patient Rachel Kline completed a BMD test on 04/29/2019 using the [employer] (analysis version: 16.SP2) manufactured by [employer]. The following summarizes the results of our evaluation. [facility] PATIENT: Name: Rachel, Kline Patient ID: [id number] Birth Date: 28-Mar-1955 Height: 63.7 in. Gender: Female Measured: 04/29/2019 Weight: 177.4 lbs. Indications: Caucasian, Hypothyroidism, Post Menopausal Fractures: Treatments: Calcium, HRT, Levothyroxine, Vitamin D ASSESSMENT: The BMD measured at Femur Neck Left is 0.673 g/cm2 with a T-score of -2.6. This patient is considered osteoporotic according to [facility]) criteria. L-3 was excluded due to degenerative changes. Compared with the prior study on, 09/30/2008 the BMD of the total mean of the lumbar spine shows a statistically significant decrease. The scan quality is good. Site Region Measured Date Measured Age WHO YA BMD Classification T-score AP Spine L1-L4 (L3) 04/29/2019 66.0 Osteopenia -1.9 0.947 g/cm2 AP Spine L1-L4 (L3) 09/30/2008 55.4 Osteopenia -1.2 1.032 g/cm2 DualFemur Neck Left 04/29/2019 66.0 Osteoporosis -2.6 0.673 g/cm2 DualFemur Neck Left 09/30/2008 55.4 Osteopenia -2.0 0.753 g/cm2 DualFemur Neck Right 04/29/2019 66.0 Osteoporosis -2.6 0.676 g/cm2 DualFemur Neck Right 09/30/2008 55.4 Osteopenia -2.2 0.729 g/cm2 DualFemur Total Mean 04/29/2019 66.0 Osteopenia -2.0 0.753 g/cm2 DualFemur Total Mean  09/30/2008 55.4 Osteopenia -1.8 0.775 g/cm2 World Health Organization [facility]) criteria for post-menopausal, Caucasian Women: Normal       T-score at or above -1 SD Osteopenia   T-score between -1 and -2.5 SD Osteoporosis T-score at or below -2.5 SD RECOMMENDATION:1. All patients should optimize calcium and vitamin D intake. 2. Consider FDA-approved medical therapies in postmenopausal women and men aged 14 years and older, based on the following: a. A hip or vertebral(clinical or morphometric) fracture. b. T-Score < -2.5 at the femoral neck or spine after appropriate evaluation to exclude secondary causes c. Low bone mass (T-score between -1.0 and -2.5 at the femoral neck or spine) and a 10 year probability of a hip fracture >3% or a 10 year probability of major osteoporosis-related fracture > 20% based on the [place]-adapted WHO algorithm d. Clinical judgement and/or patient preferences may indicate treatment for people with 10-year fracture probabilities above or below  these levels FOLLOW-UP: Patients with diagnosis of osteoporosis or at high risk for fracture should have regular bone mineral density tests. For patients eligible for Medicare, routine testing is allowed once every 2 years. The testing frequency can be increased to one year for patients who have rapidly progressing disease, those who are receiving or discontinuing medical therapy to restore bone mass, or have additional risk factors. I have reviewed this report, anf agree with the above findings. [facility] Electronically Signed   By: Lowella Grip III M.D.   On: 04/30/2019 09:12     Assessment & Plan:  Plan  I am having Orlene Plum "Debbie" maintain her aspirin, cetirizine, CoQ10, Omega-3, multivitamins with iron, Calcium-Vitamin D-Vitamin K, ICaps Areds 2, alendronate, atorvastatin, and levothyroxine.  No orders of the defined types were placed in this encounter.   Problem List Items Addressed This Visit      Unprioritized    Elevated BP without diagnosis of hypertension    Well controlled, no changes to meds. Encouraged heart healthy diet such as the DASH diet and exercise as tolerated.        Hyperlipidemia    Encouraged heart healthy diet, increase exercise, avoid trans fats, consider a krill oil cap daily con't lipitor       Relevant Orders   Lipid panel   Comprehensive metabolic panel   Hypothyroidism    Check labs con't synthroid      Relevant Orders   Thyroid Panel With TSH   RESOLVED: Primary hypertension - Primary   Relevant Orders   Lipid panel   Comprehensive metabolic panel      Follow-up: Return in about 6 months (around 04/23/2020), or if symptoms worsen or fail to improve, for annual exam, fasting.  [name], DO

## 2019-10-24 NOTE — Addendum Note (Signed)
Addended by: Kem Boroughs D on: 10/24/2019 02:05 PM   Modules accepted: Orders

## 2019-10-24 NOTE — Patient Instructions (Signed)
DASH Eating Plan DASH stands for "Dietary Approaches to Stop Hypertension." The DASH eating plan is a healthy eating plan that has been shown to reduce high blood pressure (hypertension). It may also reduce your risk for type 2 diabetes, heart disease, and stroke. The DASH eating plan may also help with weight loss. What are tips for following this plan?  General guidelines  Avoid eating more than 2,300 mg (milligrams) of salt (sodium) a day. If you have hypertension, you may need to reduce your sodium intake to 1,500 mg a day.  Limit alcohol intake to no more than 1 drink a day for nonpregnant women and 2 drinks a day for men. One drink equals 12 oz of beer, 5 oz of wine, or 1 oz of hard liquor.  Work with your health care provider to maintain a healthy body weight or to lose weight. Ask what an ideal weight is for you.  Get at least 30 minutes of exercise that causes your heart to beat faster (aerobic exercise) most days of the week. Activities may include walking, swimming, or biking.  Work with your health care provider or diet and nutrition specialist (dietitian) to adjust your eating plan to your individual calorie needs. Reading food labels   Check food labels for the amount of sodium per serving. Choose foods with less than 5 percent of the Daily Value of sodium. Generally, foods with less than 300 mg of sodium per serving fit into this eating plan.  To find whole grains, look for the word "whole" as the first word in the ingredient list. Shopping  Buy products labeled as "low-sodium" or "no salt added."  Buy fresh foods. Avoid canned foods and premade or frozen meals. Cooking  Avoid adding salt when cooking. Use salt-free seasonings or herbs instead of table salt or sea salt. Check with your health care provider or pharmacist before using salt substitutes.  Do not fry foods. Cook foods using healthy methods such as baking, boiling, grilling, and broiling instead.  Cook with  heart-healthy oils, such as olive, canola, soybean, or sunflower oil. Meal planning  Eat a balanced diet that includes: ? 5 or more servings of fruits and vegetables each day. At each meal, try to fill half of your plate with fruits and vegetables. ? Up to 6-8 servings of whole grains each day. ? Less than 6 oz of lean meat, poultry, or fish each day. A 3-oz serving of meat is about the same size as a deck of cards. One egg equals 1 oz. ? 2 servings of low-fat dairy each day. ? A serving of nuts, seeds, or beans 5 times each week. ? Heart-healthy fats. Healthy fats called Omega-3 fatty acids are found in foods such as flaxseeds and coldwater fish, like sardines, salmon, and mackerel.  Limit how much you eat of the following: ? Canned or prepackaged foods. ? Food that is high in trans fat, such as fried foods. ? Food that is high in saturated fat, such as fatty meat. ? Sweets, desserts, sugary drinks, and other foods with added sugar. ? Full-fat dairy products.  Do not salt foods before eating.  Try to eat at least 2 vegetarian meals each week.  Eat more home-cooked food and less restaurant, buffet, and fast food.  When eating at a restaurant, ask that your food be prepared with less salt or no salt, if possible. What foods are recommended? The items listed may not be a complete list. Talk with your dietitian about   what dietary choices are best for you. Grains Whole-grain or whole-wheat bread. Whole-grain or whole-wheat pasta. Brown rice. Oatmeal. Quinoa. Bulgur. Whole-grain and low-sodium cereals. Pita bread. Low-fat, low-sodium crackers. Whole-wheat flour tortillas. Vegetables Fresh or frozen vegetables (raw, steamed, roasted, or grilled). Low-sodium or reduced-sodium tomato and vegetable juice. Low-sodium or reduced-sodium tomato sauce and tomato paste. Low-sodium or reduced-sodium canned vegetables. Fruits All fresh, dried, or frozen fruit. Canned fruit in natural juice (without  added sugar). Meat and other protein foods Skinless chicken or turkey. Ground chicken or turkey. Pork with fat trimmed off. Fish and seafood. Egg whites. Dried beans, peas, or lentils. Unsalted nuts, nut butters, and seeds. Unsalted canned beans. Lean cuts of beef with fat trimmed off. Low-sodium, lean deli meat. Dairy Low-fat (1%) or fat-free (skim) milk. Fat-free, low-fat, or reduced-fat cheeses. Nonfat, low-sodium ricotta or cottage cheese. Low-fat or nonfat yogurt. Low-fat, low-sodium cheese. Fats and oils Soft margarine without trans fats. Vegetable oil. Low-fat, reduced-fat, or light mayonnaise and salad dressings (reduced-sodium). Canola, safflower, olive, soybean, and sunflower oils. Avocado. Seasoning and other foods Herbs. Spices. Seasoning mixes without salt. Unsalted popcorn and pretzels. Fat-free sweets. What foods are not recommended? The items listed may not be a complete list. Talk with your dietitian about what dietary choices are best for you. Grains Baked goods made with fat, such as croissants, muffins, or some breads. Dry pasta or rice meal packs. Vegetables Creamed or fried vegetables. Vegetables in a cheese sauce. Regular canned vegetables (not low-sodium or reduced-sodium). Regular canned tomato sauce and paste (not low-sodium or reduced-sodium). Regular tomato and vegetable juice (not low-sodium or reduced-sodium). Pickles. Olives. Fruits Canned fruit in a light or heavy syrup. Fried fruit. Fruit in cream or butter sauce. Meat and other protein foods Fatty cuts of meat. Ribs. Fried meat. Bacon. Sausage. Bologna and other processed lunch meats. Salami. Fatback. Hotdogs. Bratwurst. Salted nuts and seeds. Canned beans with added salt. Canned or smoked fish. Whole eggs or egg yolks. Chicken or turkey with skin. Dairy Whole or 2% milk, cream, and half-and-half. Whole or full-fat cream cheese. Whole-fat or sweetened yogurt. Full-fat cheese. Nondairy creamers. Whipped toppings.  Processed cheese and cheese spreads. Fats and oils Butter. Stick margarine. Lard. Shortening. Ghee. Bacon fat. Tropical oils, such as coconut, palm kernel, or palm oil. Seasoning and other foods Salted popcorn and pretzels. Onion salt, garlic salt, seasoned salt, table salt, and sea salt. Worcestershire sauce. Tartar sauce. Barbecue sauce. Teriyaki sauce. Soy sauce, including reduced-sodium. Steak sauce. Canned and packaged gravies. Fish sauce. Oyster sauce. Cocktail sauce. Horseradish that you find on the shelf. Ketchup. Mustard. Meat flavorings and tenderizers. Bouillon cubes. Hot sauce and Tabasco sauce. Premade or packaged marinades. Premade or packaged taco seasonings. Relishes. Regular salad dressings. Where to find more information:  National Heart, Lung, and Blood Institute: www.nhlbi.nih.gov  American Heart Association: www.heart.org Summary  The DASH eating plan is a healthy eating plan that has been shown to reduce high blood pressure (hypertension). It may also reduce your risk for type 2 diabetes, heart disease, and stroke.  With the DASH eating plan, you should limit salt (sodium) intake to 2,300 mg a day. If you have hypertension, you may need to reduce your sodium intake to 1,500 mg a day.  When on the DASH eating plan, aim to eat more fresh fruits and vegetables, whole grains, lean proteins, low-fat dairy, and heart-healthy fats.  Work with your health care provider or diet and nutrition specialist (dietitian) to adjust your eating plan to your   individual calorie needs. This information is not intended to replace advice given to you by your health care provider. Make sure you discuss any questions you have with your health care provider. Document Revised: 12/15/2016 Document Reviewed: 12/27/2015 Elsevier Patient Education  2020 Elsevier Inc.  

## 2019-10-25 LAB — LIPID PANEL
Cholesterol: 170 mg/dL (ref <200)
HDL: 52 mg/dL (ref >=50)
LDL Cholesterol (Calc): 97 mg/dL (calc)
Non-HDL Cholesterol (Calc): 118 mg/dL (calc) (ref <130)
Total CHOL/HDL Ratio: 3.3 (calc) (ref <5.0)
Triglycerides: 117 mg/dL (ref <150)

## 2019-10-25 LAB — COMPREHENSIVE METABOLIC PANEL
AG Ratio: 2.2 (calc) (ref 1.0–2.5)
ALT: 31 U/L — ABNORMAL HIGH (ref 6–29)
AST: 29 U/L (ref 10–35)
Albumin: 4.7 g/dL (ref 3.6–5.1)
Alkaline phosphatase (APISO): 54 U/L (ref 37–153)
BUN: 12 mg/dL (ref 7–25)
CO2: 26 mmol/L (ref 20–32)
Calcium: 9.4 mg/dL (ref 8.6–10.4)
Chloride: 102 mmol/L (ref 98–110)
Creat: 0.58 mg/dL (ref 0.50–0.99)
Globulin: 2.1 g/dL (calc) (ref 1.9–3.7)
Glucose, Bld: 104 mg/dL — ABNORMAL HIGH (ref 65–99)
Potassium: 4.9 mmol/L (ref 3.5–5.3)
Sodium: 137 mmol/L (ref 135–146)
Total Bilirubin: 0.5 mg/dL (ref 0.2–1.2)
Total Protein: 6.8 g/dL (ref 6.1–8.1)

## 2019-10-25 LAB — THYROID PANEL WITH TSH
Free Thyroxine Index: 2.4 (ref 1.4–3.8)
T3 Uptake: 30 % (ref 22–35)
T4, Total: 7.9 ug/dL (ref 5.1–11.9)
TSH: 1.01 mIU/L (ref 0.40–4.50)

## 2019-10-26 ENCOUNTER — Encounter: Payer: Self-pay | Admitting: Family Medicine

## 2019-10-27 ENCOUNTER — Encounter: Payer: Self-pay | Admitting: Family Medicine

## 2019-10-27 NOTE — Telephone Encounter (Signed)
No--- that would affect kidney , not liver

## 2019-10-29 NOTE — Telephone Encounter (Signed)
It is only slightly elevated--- any tylenol, etoh or other otc meds?

## 2019-10-29 NOTE — Telephone Encounter (Signed)
Agree with repeating in 2-3 months

## 2019-10-31 ENCOUNTER — Encounter: Payer: Self-pay | Admitting: Family Medicine

## 2019-10-31 NOTE — Telephone Encounter (Signed)
Ok to stop aspirin

## 2019-11-03 ENCOUNTER — Encounter (INDEPENDENT_AMBULATORY_CARE_PROVIDER_SITE_OTHER): Payer: No Typology Code available for payment source | Admitting: Ophthalmology

## 2019-11-03 ENCOUNTER — Other Ambulatory Visit: Payer: Self-pay

## 2019-11-03 DIAGNOSIS — H353231 Exudative age-related macular degeneration, bilateral, with active choroidal neovascularization: Secondary | ICD-10-CM | POA: Diagnosis not present

## 2019-11-03 DIAGNOSIS — H2513 Age-related nuclear cataract, bilateral: Secondary | ICD-10-CM

## 2019-11-03 DIAGNOSIS — H43813 Vitreous degeneration, bilateral: Secondary | ICD-10-CM | POA: Diagnosis not present

## 2019-11-04 ENCOUNTER — Encounter (INDEPENDENT_AMBULATORY_CARE_PROVIDER_SITE_OTHER): Payer: No Typology Code available for payment source | Admitting: Ophthalmology

## 2019-11-05 ENCOUNTER — Encounter (INDEPENDENT_AMBULATORY_CARE_PROVIDER_SITE_OTHER): Payer: No Typology Code available for payment source | Admitting: Ophthalmology

## 2019-11-18 ENCOUNTER — Other Ambulatory Visit (HOSPITAL_BASED_OUTPATIENT_CLINIC_OR_DEPARTMENT_OTHER): Payer: Self-pay | Admitting: Internal Medicine

## 2019-11-18 ENCOUNTER — Ambulatory Visit: Payer: No Typology Code available for payment source | Attending: Internal Medicine

## 2019-11-18 DIAGNOSIS — Z23 Encounter for immunization: Secondary | ICD-10-CM

## 2019-11-18 NOTE — Progress Notes (Signed)
   Covid-[facility] Vaccination [facility]  Name:  TRANNIE BARDALES    MRN: [id number] DOB: 1955-04-11  11/18/2019  Ms. Schmieder was observed post Covid-19 immunization for 15 minutes without incident. She was provided with Vaccine Information Sheet and instruction to access the V-Safe system.   Ms. Choy was instructed to call 911 with any severe reactions post vaccine: Marland Kitchen Difficulty breathing  . Swelling of face and throat  . A fast heartbeat  . A bad rash all over body  . Dizziness and weakness

## 2019-11-27 MED FILL — PFIZER-BIONTECH COVID-19 VA: 30 | 1 days supply | Qty: 0 | Fill #0

## 2019-12-03 ENCOUNTER — Encounter (INDEPENDENT_AMBULATORY_CARE_PROVIDER_SITE_OTHER): Payer: No Typology Code available for payment source | Admitting: Ophthalmology

## 2019-12-03 ENCOUNTER — Other Ambulatory Visit: Payer: Self-pay

## 2019-12-03 DIAGNOSIS — H43813 Vitreous degeneration, bilateral: Secondary | ICD-10-CM

## 2019-12-03 DIAGNOSIS — H353231 Exudative age-related macular degeneration, bilateral, with active choroidal neovascularization: Secondary | ICD-10-CM

## 2019-12-18 ENCOUNTER — Other Ambulatory Visit (HOSPITAL_BASED_OUTPATIENT_CLINIC_OR_DEPARTMENT_OTHER): Payer: Self-pay | Admitting: Dentistry

## 2019-12-18 ENCOUNTER — Other Ambulatory Visit: Payer: Self-pay | Admitting: Family Medicine

## 2019-12-18 DIAGNOSIS — E785 Hyperlipidemia, unspecified: Secondary | ICD-10-CM

## 2019-12-18 MED FILL — PREVIDENT 5000 1.1% DRY MOU: 1.1 | 30 days supply | Qty: 100 | Fill #0

## 2019-12-19 ENCOUNTER — Other Ambulatory Visit: Payer: Self-pay | Admitting: Family Medicine

## 2019-12-19 MED FILL — ATORVASTATIN CALCIUM 20 MG: 20 | 90 days supply | Qty: 90 | Fill #0

## 2019-12-31 ENCOUNTER — Other Ambulatory Visit: Payer: Self-pay

## 2019-12-31 ENCOUNTER — Encounter (INDEPENDENT_AMBULATORY_CARE_PROVIDER_SITE_OTHER): Payer: No Typology Code available for payment source | Admitting: Ophthalmology

## 2019-12-31 DIAGNOSIS — H2513 Age-related nuclear cataract, bilateral: Secondary | ICD-10-CM | POA: Diagnosis not present

## 2019-12-31 DIAGNOSIS — H43813 Vitreous degeneration, bilateral: Secondary | ICD-10-CM

## 2019-12-31 DIAGNOSIS — H353231 Exudative age-related macular degeneration, bilateral, with active choroidal neovascularization: Secondary | ICD-10-CM

## 2020-01-01 ENCOUNTER — Other Ambulatory Visit: Payer: Self-pay | Admitting: Family Medicine

## 2020-01-01 MED FILL — ALENDRONATE NA 70 MG TAB: 70 | 84 days supply | Qty: 12 | Fill #0

## 2020-01-20 MED FILL — LEVOTHYROXINE SODIUM 50 MCG: 50 | 90 days supply | Qty: 90 | Fill #1

## 2020-01-28 ENCOUNTER — Encounter (INDEPENDENT_AMBULATORY_CARE_PROVIDER_SITE_OTHER): Payer: No Typology Code available for payment source | Admitting: Ophthalmology

## 2020-01-28 ENCOUNTER — Other Ambulatory Visit: Payer: Self-pay

## 2020-01-28 DIAGNOSIS — H43813 Vitreous degeneration, bilateral: Secondary | ICD-10-CM | POA: Diagnosis not present

## 2020-01-28 DIAGNOSIS — H353231 Exudative age-related macular degeneration, bilateral, with active choroidal neovascularization: Secondary | ICD-10-CM | POA: Diagnosis not present

## 2020-02-25 ENCOUNTER — Other Ambulatory Visit: Payer: Self-pay

## 2020-02-25 ENCOUNTER — Encounter (INDEPENDENT_AMBULATORY_CARE_PROVIDER_SITE_OTHER): Payer: No Typology Code available for payment source | Admitting: Ophthalmology

## 2020-02-25 DIAGNOSIS — H43813 Vitreous degeneration, bilateral: Secondary | ICD-10-CM | POA: Diagnosis not present

## 2020-02-25 DIAGNOSIS — H353231 Exudative age-related macular degeneration, bilateral, with active choroidal neovascularization: Secondary | ICD-10-CM | POA: Diagnosis not present

## 2020-03-15 MED FILL — ATORVASTATIN CALCIUM 20 MG: 20 | 90 days supply | Qty: 90 | Fill #1

## 2020-03-22 MED FILL — ALENDRONATE NA 70 MG TAB: 70 | 84 days supply | Qty: 12 | Fill #1

## 2020-03-24 ENCOUNTER — Other Ambulatory Visit: Payer: Self-pay

## 2020-03-24 ENCOUNTER — Encounter (INDEPENDENT_AMBULATORY_CARE_PROVIDER_SITE_OTHER): Payer: No Typology Code available for payment source | Admitting: Ophthalmology

## 2020-03-24 DIAGNOSIS — H353231 Exudative age-related macular degeneration, bilateral, with active choroidal neovascularization: Secondary | ICD-10-CM | POA: Diagnosis not present

## 2020-03-24 DIAGNOSIS — H43813 Vitreous degeneration, bilateral: Secondary | ICD-10-CM

## 2020-04-21 ENCOUNTER — Other Ambulatory Visit: Payer: Self-pay

## 2020-04-21 ENCOUNTER — Encounter (INDEPENDENT_AMBULATORY_CARE_PROVIDER_SITE_OTHER): Payer: No Typology Code available for payment source | Admitting: Ophthalmology

## 2020-04-21 DIAGNOSIS — H43813 Vitreous degeneration, bilateral: Secondary | ICD-10-CM | POA: Diagnosis not present

## 2020-04-21 DIAGNOSIS — H353231 Exudative age-related macular degeneration, bilateral, with active choroidal neovascularization: Secondary | ICD-10-CM | POA: Diagnosis not present

## 2020-04-21 DIAGNOSIS — H2513 Age-related nuclear cataract, bilateral: Secondary | ICD-10-CM

## 2020-04-26 ENCOUNTER — Other Ambulatory Visit: Payer: Self-pay | Admitting: Family Medicine

## 2020-04-26 ENCOUNTER — Other Ambulatory Visit: Payer: Self-pay

## 2020-04-26 ENCOUNTER — Other Ambulatory Visit (HOSPITAL_BASED_OUTPATIENT_CLINIC_OR_DEPARTMENT_OTHER): Payer: Self-pay

## 2020-04-26 DIAGNOSIS — E038 Other specified hypothyroidism: Secondary | ICD-10-CM

## 2020-04-26 MED ORDER — LEVOTHYROXINE SODIUM 50 MCG PO TABS
50.0000 ug | ORAL_TABLET | Freq: Every day | ORAL | 0 refills | Status: DC
  Filled 2020-04-26: qty 30, 30d supply, fill #0

## 2020-04-26 MED FILL — Sodium Fluoride Gel 1.1% (0.5% F): DENTAL | 30 days supply | Qty: 100 | Fill #0 | Status: AC

## 2020-04-27 ENCOUNTER — Encounter: Payer: Self-pay | Admitting: Family Medicine

## 2020-04-27 ENCOUNTER — Ambulatory Visit (INDEPENDENT_AMBULATORY_CARE_PROVIDER_SITE_OTHER): Payer: No Typology Code available for payment source | Admitting: Family Medicine

## 2020-04-27 VITALS — BP 142/82 | HR 70 | Temp 98.8°F | Resp 18 | Ht 64.0 in | Wt 175.6 lb

## 2020-04-27 DIAGNOSIS — Z0000 Encounter for general adult medical examination without abnormal findings: Secondary | ICD-10-CM | POA: Diagnosis not present

## 2020-04-27 DIAGNOSIS — E2839 Other primary ovarian failure: Secondary | ICD-10-CM | POA: Diagnosis not present

## 2020-04-27 DIAGNOSIS — E039 Hypothyroidism, unspecified: Secondary | ICD-10-CM | POA: Diagnosis not present

## 2020-04-27 DIAGNOSIS — E785 Hyperlipidemia, unspecified: Secondary | ICD-10-CM

## 2020-04-27 LAB — CBC WITH DIFFERENTIAL/PLATELET
Basophils Absolute: 0 10*3/uL (ref 0.0–0.1)
Basophils Relative: 0.9 % (ref 0.0–3.0)
Eosinophils Absolute: 0.1 10*3/uL (ref 0.0–0.7)
Eosinophils Relative: 2.3 % (ref 0.0–5.0)
HCT: 44.5 % (ref 36.0–46.0)
Hemoglobin: 15.1 g/dL — ABNORMAL HIGH (ref 12.0–15.0)
Lymphocytes Relative: 24 % (ref 12.0–46.0)
Lymphs Abs: 1.3 10*3/uL (ref 0.7–4.0)
MCHC: 33.8 g/dL (ref 30.0–36.0)
MCV: 94.2 fl (ref 78.0–100.0)
Monocytes Absolute: 0.4 10*3/uL (ref 0.1–1.0)
Monocytes Relative: 7.7 % (ref 3.0–12.0)
Neutro Abs: 3.6 10*3/uL (ref 1.4–7.7)
Neutrophils Relative %: 65.1 % (ref 43.0–77.0)
Platelets: 253 10*3/uL (ref 150.0–400.0)
RBC: 4.73 Mil/uL (ref 3.87–5.11)
RDW: 12.8 % (ref 11.5–15.5)
WBC: 5.5 10*3/uL (ref 4.0–10.5)

## 2020-04-27 LAB — LIPID PANEL
Cholesterol: 161 mg/dL (ref 0–200)
HDL: 52.9 mg/dL (ref >39.00)
LDL Cholesterol: 85 mg/dL (ref 0–99)
NonHDL: 108.37
Total CHOL/HDL Ratio: 3
Triglycerides: 119 mg/dL (ref 0.0–149.0)
VLDL: 23.8 mg/dL (ref 0.0–40.0)

## 2020-04-27 LAB — TSH: TSH: 0.94 u[IU]/mL (ref 0.35–4.50)

## 2020-04-27 LAB — COMPREHENSIVE METABOLIC PANEL
ALT: 27 U/L (ref 0–35)
AST: 26 U/L (ref 0–37)
Albumin: 4.5 g/dL (ref 3.5–5.2)
Alkaline Phosphatase: 54 U/L (ref 39–117)
BUN: 10 mg/dL (ref 6–23)
CO2: 27 mEq/L (ref 19–32)
Calcium: 9.5 mg/dL (ref 8.4–10.5)
Chloride: 102 mEq/L (ref 96–112)
Creatinine, Ser: 0.59 mg/dL (ref 0.40–1.20)
GFR: 93.49 mL/min (ref >60.00)
Glucose, Bld: 93 mg/dL (ref 70–99)
Potassium: 4.5 mEq/L (ref 3.5–5.1)
Sodium: 137 mEq/L (ref 135–145)
Total Bilirubin: 0.5 mg/dL (ref 0.2–1.2)
Total Protein: 7 g/dL (ref 6.0–8.3)

## 2020-04-27 LAB — VITAMIN D 25 HYDROXY (VIT D DEFICIENCY, FRACTURES): VITD: 68.28 ng/mL (ref 30.00–100.00)

## 2020-04-27 NOTE — Patient Instructions (Signed)
Preventive Care 67 Years and Older, Female Preventive care refers to lifestyle choices and visits with your health care provider that can promote health and wellness. This includes:  A yearly physical exam. This is also called an annual wellness visit.  Regular dental and eye exams.  Immunizations.  Screening for certain conditions.  Healthy lifestyle choices, such as: ? Eating a healthy diet. ? Getting regular exercise. ? Not using drugs or products that contain nicotine and tobacco. ? Limiting alcohol use. What can I expect for my preventive care visit? Physical exam Your health care provider will check your:  Height and weight. These may be used to calculate your BMI (body mass index). BMI is a measurement that tells if you are at a healthy weight.  Heart rate and blood pressure.  Body temperature.  Skin for abnormal spots. Counseling Your health care provider may ask you questions about your:  Past medical problems.  Family's medical history.  Alcohol, tobacco, and drug use.  Emotional well-being.  Home life and relationship well-being.  Sexual activity.  Diet, exercise, and sleep habits.  History of falls.  Memory and ability to understand (cognition).  Work and work Statistician.  Pregnancy and menstrual history.  Access to firearms. What immunizations do I need? Vaccines are usually given at various ages, according to a schedule. Your health care provider will recommend vaccines for you based on your age, medical history, and lifestyle or other factors, such as travel or where you work.   What tests do I need? Blood tests  Lipid and cholesterol levels. These may be checked every 5 years, or more often depending on your overall health.  Hepatitis C test.  Hepatitis B test. Screening  Lung cancer screening. You may have this screening every year starting at age 74 if you have a 30-pack-year history of smoking and currently smoke or have quit within  the past 15 years.  Colorectal cancer screening. ? All adults should have this screening starting at age 44 and continuing until age 58. ? Your health care provider may recommend screening at age 2 if you are at increased risk. ? You will have tests every 1-10 years, depending on your results and the type of screening test.  Diabetes screening. ? This is done by checking your blood sugar (glucose) after you have not eaten for a while (fasting). ? You may have this done every 1-3 years.  Mammogram. ? This may be done every 1-2 years. ? Talk with your health care provider about how often you should have regular mammograms.  Abdominal aortic aneurysm (AAA) screening. You may need this if you are a current or former smoker.  BRCA-related cancer screening. This may be done if you have a family history of breast, ovarian, tubal, or peritoneal cancers. Other tests  STD (sexually transmitted disease) testing, if you are at risk.  Bone density scan. This is done to screen for osteoporosis. You may have this done starting at age 104. Talk with your health care provider about your test results, treatment options, and if necessary, the need for more tests. Follow these instructions at home: Eating and drinking  Eat a diet that includes fresh fruits and vegetables, whole grains, lean protein, and low-fat dairy products. Limit your intake of foods with high amounts of sugar, saturated fats, and salt.  Take vitamin and mineral supplements as recommended by your health care provider.  Do not drink alcohol if your health care provider tells you not to drink.  If you drink alcohol: ? Limit how much you have to 0-1 drink a day. ? Be aware of how much alcohol is in your drink. In the [place] one drink equals one 12 oz bottle of beer (355 mL), one 5 oz glass of wine (148 mL), or one 1 oz glass of hard liquor (44 mL).   Lifestyle  Take daily care of your teeth and gums. Brush your teeth every morning  and night with fluoride toothpaste. Floss one time each day.  Stay active. Exercise for at least 30 minutes 5 or more days each week.  Do not use any products that contain nicotine or tobacco, such as cigarettes, e-cigarettes, and chewing tobacco. If you need help quitting, ask your health care provider.  Do not use drugs.  If you are sexually active, practice safe sex. Use a condom or other form of protection in order to prevent STIs (sexually transmitted infections).  Talk with your health care provider about taking a low-dose aspirin or statin.  Find healthy ways to cope with stress, such as: ? Meditation, yoga, or listening to music. ? Journaling. ? Talking to a trusted person. ? Spending time with friends and family. Safety  Always wear your seat belt while driving or riding in a vehicle.  Do not drive: ? If you have been drinking alcohol. Do not ride with someone who has been drinking. ? When you are tired or distracted. ? While texting.  Wear a helmet and other protective equipment during sports activities.  If you have firearms in your house, make sure you follow all gun safety procedures. What's next?  Visit your health care provider once a year for an annual wellness visit.  Ask your health care provider how often you should have your eyes and teeth checked.  Stay up to date on all vaccines. This information is not intended to replace advice given to you by your health care provider. Make sure you discuss any questions you have with your health care provider. Document Revised: 12/24/2019 Document Reviewed: 12/27/2017 Elsevier Patient Education  2021 Elsevier Inc.  

## 2020-04-27 NOTE — Assessment & Plan Note (Signed)
Check labs Lab Results  Component Value Date   TSH 1.01 10/24/2019

## 2020-04-27 NOTE — Assessment & Plan Note (Signed)
Encouraged heart healthy diet, increase exercise, avoid trans fats, consider a krill oil cap daily 

## 2020-04-27 NOTE — Progress Notes (Signed)
Patient ID: Rachel Kline, female    DOB: 1955-05-07  Age: 65 y.o. MRN: [id number]    Subjective:  Subjective  HPI [name] presents for presents for comprehensive physical exam today and follow up on management of chronic concerns. She states that she is feeling great with no recent sicknesses to report. She denies any chest pain, SOB, fever, abdominal pain, cough, chills, sore throat, dysuria, urinary incontinence, back pain, HA, or N/VD. She states that she is doing well on Fosamax. She endorses exercising 3-4x a week that is at least 30 minutes in duration. She states that she is currently retired from work at [employer]    Review of Systems  Constitutional: Negative for chills, fatigue and fever.  HENT: Negative for congestion, rhinorrhea, sinus pressure, sinus pain and sore throat.   Eyes: Negative for pain.  Respiratory: Negative for cough and shortness of breath.   Cardiovascular: Negative for chest pain, palpitations and leg swelling.  Gastrointestinal: Negative for abdominal pain, blood in stool, diarrhea, nausea and vomiting.  Genitourinary: Negative for decreased urine volume, flank pain, frequency, vaginal bleeding and vaginal discharge.  Musculoskeletal: Negative for back pain.  Neurological: Negative for headaches.    History Past Medical History:  Diagnosis Date  . Contact lens/glasses fitting    wears contacts or glasses  . Hyperlipidemia   . Macular degeneration 07/02/2012  . Primary hypertension 10/24/2019    She has a past surgical history that includes Ganglion cyst excision (6/99); Colonoscopy; and Tonsillectomy (N/A, 11/22/2012).   Her family history includes Coronary artery disease in an other family member; Diabetes in her mother; Heart disease in her father; Heart failure in her father; Hyperlipidemia in her brother; Hypertension in her brother and father; Kidney failure in her father; Pancreatic cancer in her mother.She reports that she has never  smoked. She has never used smokeless tobacco. She reports current alcohol use. She reports that she does not use drugs.  Current Outpatient Medications on File Prior to Visit  Medication Sig Dispense Refill  . alendronate (FOSAMAX) 70 MG tablet TAKE 1 TABLET (70 MG TOTAL) BY MOUTH EVERY 7 (SEVEN) DAYS. TAKE WITH A FULL GLASS OF WATER ON AN EMPTY STOMACH. 12 tablet 3  . atorvastatin (LIPITOR) 20 MG tablet TAKE 1 TABLET BY MOUTH ONCE DAILY 90 tablet 1  . Calcium-Vitamin D-Vitamin K 500-500-40 MG-UNT-MCG CHEW 1 po tid 60 tablet   . cetirizine (ZYRTEC) 10 MG tablet Take 10 mg by mouth daily.    . Coenzyme Q10 (COQ10) 30 MG CAPS Take by mouth 2 (two) times daily.    Marland Kitchen COVID-19 mRNA vaccine, Pfizer, 30 MCG/0.3ML injection INJECT AS DIRECTED .3 mL 0  . levothyroxine (SYNTHROID) 50 MCG tablet Take 1 tablet (50 mcg total) by mouth daily before breakfast. 30 tablet 0  . Multiple Vitamins-Iron (MULTIVITAMINS WITH IRON) TABS Take 1 tablet by mouth daily.  0  . Multiple Vitamins-Minerals (ICAPS AREDS 2) CAPS Take 2 capsules by mouth daily.    . Omega-3 350 MG CAPS Take by mouth.    . sodium fluoride (FLUORISHIELD) 1.1 % GEL dental gel APPLY A THIN RIBBON OR PEA SIZED AMOUNT SIZED AMOUNT TO TOOTHBRUSH AND BRUSH THROUGHLY FOR 2 MINUTES IN PLACE OF TOOTHPASTE 100 mL 99   No current facility-administered medications on file prior to visit.     Objective:  Objective  Physical Exam Vitals and nursing note reviewed.  Constitutional:      General: She is not in acute distress.  Appearance: Normal appearance. She is not ill-appearing or toxic-appearing.  HENT:     Head: Normocephalic and atraumatic.     Right Ear: Tympanic membrane, ear canal and external ear normal.     Left Ear: Tympanic membrane, ear canal and external ear normal.     Nose: No congestion or rhinorrhea.  Eyes:     Extraocular Movements: Extraocular movements intact.     Pupils: Pupils are equal, round, and reactive to light.   Cardiovascular:     Rate and Rhythm: Normal rate and regular rhythm.     Pulses: Normal pulses.     Heart sounds: Normal heart sounds. No murmur heard.   Pulmonary:     Effort: Pulmonary effort is normal. No respiratory distress.     Breath sounds: Normal breath sounds. No wheezing, rhonchi or rales.  Abdominal:     General: Bowel sounds are normal.     Palpations: Abdomen is soft. There is no mass.     Tenderness: There is no abdominal tenderness. There is no guarding.     Hernia: No hernia is present.  Musculoskeletal:        General: Normal range of motion.     Cervical back: Normal range of motion and neck supple.  Skin:    General: Skin is warm and dry.  Neurological:     Mental Status: She is alert and oriented to person, place, and time.  Psychiatric:        Behavior: Behavior normal.    BP (!) 142/82 (BP Location: Left Arm, Patient Position: Sitting, Cuff Size: Normal)   Pulse 70   Temp 98.8 F (37.1 C) (Oral)   Resp 18   Ht 5\' 4"  (1.626 m)   Wt 175 lb 9.6 oz (79.7 kg)   SpO2 97%   BMI 30.14 kg/m  Wt Readings from Last 3 Encounters:  04/27/20 175 lb 9.6 oz (79.7 kg)  10/24/19 177 lb 3.2 oz (80.4 kg)  04/24/19 177 lb (80.3 kg)     Lab Results  Component Value Date   WBC 6.2 03/18/2018   HGB 14.4 03/18/2018   HCT 42.1 03/18/2018   PLT 242.0 03/18/2018   GLUCOSE 104 (H) 10/24/2019   CHOL 170 10/24/2019   TRIG 117 10/24/2019   HDL 52 10/24/2019   LDLDIRECT 149.7 03/31/2009   LDLCALC 97 10/24/2019   ALT 31 (H) 10/24/2019   AST 29 10/24/2019   NA 137 10/24/2019   K 4.9 10/24/2019   CL 102 10/24/2019   CREATININE 0.58 10/24/2019   BUN 12 10/24/2019   CO2 26 10/24/2019   TSH 1.01 10/24/2019   MICROALBUR 0.2 07/15/2012    DG Bone Density  Result Date: 04/30/2019 EXAM: DUAL X-RAY ABSORPTIOMETRY (DXA) FOR BONE MINERAL DENSITY IMPRESSION: Alferd Apa [place] [facility] Your patient Rachel Kline completed a BMD test on 04/29/2019 using the [employer] (analysis version: 16.SP2) manufactured by [employer]. The following summarizes the results of our evaluation. [facility] PATIENT: Name: Rachel, Kline Patient ID: [id number] Birth Date: 10-15-1955 Height: 63.7 in. Gender: Female Measured: 04/29/2019 Weight: 177.4 lbs. Indications: Caucasian, Hypothyroidism, Post Menopausal Fractures: Treatments: Calcium, HRT, Levothyroxine, Vitamin D ASSESSMENT: The BMD measured at Femur Neck Left is 0.673 g/cm2 with a T-score of -2.6. This patient is considered osteoporotic according to [facility]) criteria. L-3 was excluded due to degenerative changes. Compared with the prior study on, 09/30/2008 the BMD of the total mean of the lumbar spine shows a statistically significant  decrease. The scan quality is good. Site Region Measured Date Measured Age WHO YA BMD Classification T-score AP Spine L1-L4 (L3) 04/29/2019 66.0 Osteopenia -1.9 0.947 g/cm2 AP Spine L1-L4 (L3) 09/30/2008 55.4 Osteopenia -1.2 1.032 g/cm2 DualFemur Neck Left 04/29/2019 66.0 Osteoporosis -2.6 0.673 g/cm2 DualFemur Neck Left 09/30/2008 55.4 Osteopenia -2.0 0.753 g/cm2 DualFemur Neck Right 04/29/2019 66.0 Osteoporosis -2.6 0.676 g/cm2 DualFemur Neck Right 09/30/2008 55.4 Osteopenia -2.2 0.729 g/cm2 DualFemur Total Mean 04/29/2019 66.0 Osteopenia -2.0 0.753 g/cm2 DualFemur Total Mean 09/30/2008 55.4 Osteopenia -1.8 0.775 g/cm2 World Health Organization [facility]) criteria for post-menopausal, Caucasian Women: Normal       T-score at or above -1 SD Osteopenia   T-score between -1 and -2.5 SD Osteoporosis T-score at or below -2.5 SD RECOMMENDATION:1. All patients should optimize calcium and vitamin D intake. 2. Consider FDA-approved medical therapies in postmenopausal women and men aged 2 years and older, based on the following: a. A hip or vertebral(clinical or morphometric) fracture. b. T-Score < -2.5 at the femoral neck or spine after appropriate evaluation to exclude secondary causes c. Low  bone mass (T-score between -1.0 and -2.5 at the femoral neck or spine) and a 10 year probability of a hip fracture >3% or a 10 year probability of major osteoporosis-related fracture > 20% based on the [place]-adapted WHO algorithm d. Clinical judgement and/or patient preferences may indicate treatment for people with 10-year fracture probabilities above or below these levels FOLLOW-UP: Patients with diagnosis of osteoporosis or at high risk for fracture should have regular bone mineral density tests. For patients eligible for Medicare, routine testing is allowed once every 2 years. The testing frequency can be increased to one year for patients who have rapidly progressing disease, those who are receiving or discontinuing medical therapy to restore bone mass, or have additional risk factors. I have reviewed this report, anf agree with the above findings. [facility] Radiology Electronically Signed   By: Lowella Grip III M.D.   On: 04/30/2019 09:12     Assessment & Plan:  Plan    No orders of the defined types were placed in this encounter.   Problem List Items Addressed This Visit      Unprioritized   Hyperlipidemia - Primary    Encouraged heart healthy diet, increase exercise, avoid trans fats, consider a krill oil cap daily      Relevant Orders   CBC with Differential/Platelet   Lipid panel   Comprehensive metabolic panel   Hypothyroidism    Check labs Lab Results  Component Value Date   TSH 1.01 10/24/2019         Relevant Orders   CBC with Differential/Platelet   TSH   Preventative health care    ghm utd Check labs        Other Visit Diagnoses    Estrogen deficiency       Relevant Orders   Vitamin D (25 hydroxy)      Dexa: Last completed on 04/29/2019, Osteopetrosis noted, repeat every 2 years.  Follow-up: Return in about 6 months (around 10/27/2020), or if symptoms worsen or fail to improve, for hyperlipidemia.   I,David Hanna,acting as a Education administrator for [employer], DO.,have documented all relevant documentation on the behalf of [name], DO,as directed by  [name], DO while in the presence of Hatch, DO, have reviewed all documentation for this visit. The documentation on 04/27/20 for the exam, diagnosis,  procedures, and orders are all accurate and complete.

## 2020-04-27 NOTE — Assessment & Plan Note (Signed)
ghm utd Check labs  

## 2020-04-28 LAB — HM MAMMOGRAPHY

## 2020-05-18 ENCOUNTER — Other Ambulatory Visit (HOSPITAL_BASED_OUTPATIENT_CLINIC_OR_DEPARTMENT_OTHER): Payer: Self-pay

## 2020-05-18 MED ORDER — BESIVANCE 0.6 % OP SUSP
OPHTHALMIC | 12 refills | Status: DC
  Filled 2020-05-18: qty 5, 25d supply, fill #0

## 2020-05-19 ENCOUNTER — Other Ambulatory Visit (HOSPITAL_BASED_OUTPATIENT_CLINIC_OR_DEPARTMENT_OTHER): Payer: Self-pay

## 2020-05-20 ENCOUNTER — Other Ambulatory Visit (HOSPITAL_BASED_OUTPATIENT_CLINIC_OR_DEPARTMENT_OTHER): Payer: Self-pay

## 2020-05-26 ENCOUNTER — Encounter (INDEPENDENT_AMBULATORY_CARE_PROVIDER_SITE_OTHER): Payer: No Typology Code available for payment source | Admitting: Ophthalmology

## 2020-05-26 ENCOUNTER — Other Ambulatory Visit: Payer: Self-pay

## 2020-05-26 DIAGNOSIS — H353231 Exudative age-related macular degeneration, bilateral, with active choroidal neovascularization: Secondary | ICD-10-CM | POA: Diagnosis not present

## 2020-05-26 DIAGNOSIS — H43813 Vitreous degeneration, bilateral: Secondary | ICD-10-CM | POA: Diagnosis not present

## 2020-05-26 DIAGNOSIS — H2513 Age-related nuclear cataract, bilateral: Secondary | ICD-10-CM

## 2020-06-01 ENCOUNTER — Other Ambulatory Visit: Payer: Self-pay | Admitting: Family Medicine

## 2020-06-01 DIAGNOSIS — E038 Other specified hypothyroidism: Secondary | ICD-10-CM

## 2020-06-02 ENCOUNTER — Other Ambulatory Visit (HOSPITAL_BASED_OUTPATIENT_CLINIC_OR_DEPARTMENT_OTHER): Payer: Self-pay

## 2020-06-02 MED ORDER — LEVOTHYROXINE SODIUM 50 MCG PO TABS
50.0000 ug | ORAL_TABLET | Freq: Every day | ORAL | 0 refills | Status: DC
  Filled 2020-06-02: qty 30, 30d supply, fill #0

## 2020-06-04 ENCOUNTER — Other Ambulatory Visit (HOSPITAL_BASED_OUTPATIENT_CLINIC_OR_DEPARTMENT_OTHER): Payer: Self-pay

## 2020-06-14 ENCOUNTER — Other Ambulatory Visit: Payer: Self-pay | Admitting: Family Medicine

## 2020-06-14 DIAGNOSIS — E785 Hyperlipidemia, unspecified: Secondary | ICD-10-CM

## 2020-06-14 MED FILL — Alendronate Sodium Tab 70 MG: ORAL | 84 days supply | Qty: 12 | Fill #0 | Status: AC

## 2020-06-15 ENCOUNTER — Other Ambulatory Visit (HOSPITAL_BASED_OUTPATIENT_CLINIC_OR_DEPARTMENT_OTHER): Payer: Self-pay

## 2020-06-15 MED ORDER — ATORVASTATIN CALCIUM 20 MG PO TABS
ORAL_TABLET | Freq: Every day | ORAL | 1 refills | Status: DC
  Filled 2020-06-15: qty 90, 90d supply, fill #0
  Filled 2020-09-13: qty 90, 90d supply, fill #1

## 2020-07-01 ENCOUNTER — Encounter: Payer: Self-pay | Admitting: Family Medicine

## 2020-07-01 ENCOUNTER — Other Ambulatory Visit (HOSPITAL_BASED_OUTPATIENT_CLINIC_OR_DEPARTMENT_OTHER): Payer: Self-pay

## 2020-07-01 ENCOUNTER — Other Ambulatory Visit (HOSPITAL_COMMUNITY): Payer: Self-pay

## 2020-07-01 DIAGNOSIS — E038 Other specified hypothyroidism: Secondary | ICD-10-CM

## 2020-07-01 MED ORDER — LEVOTHYROXINE SODIUM 50 MCG PO TABS
50.0000 ug | ORAL_TABLET | Freq: Every day | ORAL | 1 refills | Status: DC
  Filled 2020-07-01 (×2): qty 90, 90d supply, fill #0
  Filled 2020-09-27: qty 90, 90d supply, fill #1

## 2020-07-07 ENCOUNTER — Other Ambulatory Visit: Payer: Self-pay

## 2020-07-07 ENCOUNTER — Encounter (INDEPENDENT_AMBULATORY_CARE_PROVIDER_SITE_OTHER): Payer: No Typology Code available for payment source | Admitting: Ophthalmology

## 2020-07-07 DIAGNOSIS — H353231 Exudative age-related macular degeneration, bilateral, with active choroidal neovascularization: Secondary | ICD-10-CM | POA: Diagnosis not present

## 2020-07-07 DIAGNOSIS — H2513 Age-related nuclear cataract, bilateral: Secondary | ICD-10-CM

## 2020-07-07 DIAGNOSIS — H43813 Vitreous degeneration, bilateral: Secondary | ICD-10-CM | POA: Diagnosis not present

## 2020-08-09 ENCOUNTER — Encounter: Payer: Self-pay | Admitting: Family Medicine

## 2020-08-12 ENCOUNTER — Other Ambulatory Visit (HOSPITAL_BASED_OUTPATIENT_CLINIC_OR_DEPARTMENT_OTHER): Payer: Self-pay

## 2020-08-12 ENCOUNTER — Telehealth: Payer: No Typology Code available for payment source | Admitting: Physician Assistant

## 2020-08-12 DIAGNOSIS — U071 COVID-19: Secondary | ICD-10-CM

## 2020-08-12 MED ORDER — BENZONATATE 100 MG PO CAPS
100.0000 mg | ORAL_CAPSULE | Freq: Three times a day (TID) | ORAL | 0 refills | Status: DC | PRN
  Filled 2020-08-12: qty 30, 10d supply, fill #0

## 2020-08-12 MED ORDER — MOLNUPIRAVIR EUA 200MG CAPSULE
4.0000 | ORAL_CAPSULE | Freq: Two times a day (BID) | ORAL | 0 refills | Status: AC
  Filled 2020-08-12: qty 40, 5d supply, fill #0

## 2020-08-12 NOTE — Patient Instructions (Signed)
Rachel Kline, thank you for joining Leeanne Rio, PA-C for today's virtual visit.  While this provider is not your primary care provider (PCP), if your PCP is located in our provider database this encounter information will be shared with them immediately following your visit.  Consent: (Patient) Rachel Kline provided verbal consent for this virtual visit at the beginning of the encounter.  Current Medications:  Current Outpatient Medications:    benzonatate (TESSALON) 100 MG capsule, Take 1 capsule (100 mg total) by mouth 3 (three) times daily as needed for cough., Disp: 30 capsule, Rfl: 0   molnupiravir EUA 200 mg CAPS, Take 4 capsules (800 mg total) by mouth 2 (two) times daily for 5 days., Disp: 40 capsule, Rfl: 0   alendronate (FOSAMAX) 70 MG tablet, TAKE 1 TABLET (70 MG TOTAL) BY MOUTH EVERY 7 (SEVEN) DAYS. TAKE WITH A FULL GLASS OF WATER ON AN EMPTY STOMACH., Disp: 12 tablet, Rfl: 3   atorvastatin (LIPITOR) 20 MG tablet, TAKE 1 TABLET BY MOUTH ONCE DAILY, Disp: 90 tablet, Rfl: 1   Besifloxacin HCl (BESIVANCE) 0.6 % SUSP, instill one drop into left eye 4 times a day for 2 days after each monthly eye injection, Disp: 5 mL, Rfl: 12   Calcium-Vitamin D-Vitamin K 500-500-40 MG-UNT-MCG CHEW, 1 po tid, Disp: 60 tablet, Rfl:    cetirizine (ZYRTEC) 10 MG tablet, Take 10 mg by mouth daily., Disp: , Rfl:    Coenzyme Q10 (COQ10) 30 MG CAPS, Take by mouth 2 (two) times daily., Disp: , Rfl:    COVID-19 mRNA vaccine, Pfizer, 30 MCG/0.3ML injection, INJECT AS DIRECTED, Disp: .3 mL, Rfl: 0   levothyroxine (SYNTHROID) 50 MCG tablet, Take 1 tablet (50 mcg total) by mouth daily before breakfast., Disp: 90 tablet, Rfl: 1   Multiple Vitamins-Iron (MULTIVITAMINS WITH IRON) TABS, Take 1 tablet by mouth daily., Disp: , Rfl: 0   Multiple Vitamins-Minerals (ICAPS AREDS 2) CAPS, Take 2 capsules by mouth daily., Disp: , Rfl:    Omega-3 350 MG CAPS, Take by mouth., Disp: , Rfl:    sodium fluoride  (FLUORISHIELD) 1.1 % GEL dental gel, APPLY A THIN RIBBON OR PEA SIZED AMOUNT SIZED AMOUNT TO TOOTHBRUSH AND BRUSH THROUGHLY FOR 2 MINUTES IN PLACE OF TOOTHPASTE, Disp: 100 mL, Rfl: 99   Medications ordered in this encounter:  Meds ordered this encounter  Medications   molnupiravir EUA 200 mg CAPS    Sig: Take 4 capsules (800 mg total) by mouth 2 (two) times daily for 5 days.    Dispense:  40 capsule    Refill:  0    Order Specific Question:   Supervising Provider    Answer:   MILLER, BRIAN [[id number]]   benzonatate (TESSALON) 100 MG capsule    Sig: Take 1 capsule (100 mg total) by mouth 3 (three) times daily as needed for cough.    Dispense:  30 capsule    Refill:  0    Order Specific Question:   Supervising Provider    Answer:   Sabra Heck, [place]     *If you need refills on other medications prior to your next appointment, please contact your pharmacy*  Follow-Up: Call back or seek an in-person evaluation if the symptoms worsen or if the condition fails to improve as anticipated.  Other Instructions Please keep well-hydrated and get plenty of rest. Start a saline nasal rinse to flush out your nasal passages. You can use plain Mucinex to help thin congestion. If you have  a humidifier, running in the bedroom at night. I want you to start OTC vitamin D3 1000 units daily, vitamin C 1000 mg daily, and a zinc supplement. Please take prescribed medications as directed.  You have been enrolled in a MyChart symptom monitoring program. Please answer these questions daily so we can keep track of how you are doing.  You were to quarantine for 5 days from onset of your symptoms.  After day 5, if you have had no fever and you are feeling better, you can end quarantine but need to mask for an additional 5 days. After day 5 if you have a fever or are having significant symptoms, please quarantine for full 10 days.  If you note any worsening of symptoms, any significant shortness of breath or any  chest pain, please seek ER evaluation ASAP.  Please do not delay care!    If you have been instructed to have an in-person evaluation today at a local Urgent Care facility, please use the link below. It will take you to a list of all of our available [facility], including address, phone number and hours of operation. Please do not delay care.  [facility]  If you or a family member do not have a primary care provider, use the link below to schedule a visit and establish care. When you choose a [facility] primary care physician or advanced practice provider, you gain a long-term partner in health. Find a Primary Care Provider  Learn more about [facility] in-office and virtual care options: [facility]

## 2020-08-12 NOTE — Progress Notes (Signed)
Virtual Visit Consent   Rachel Kline, you are scheduled for a virtual visit with a [facility] provider today.     Just as with appointments in the office, your consent must be obtained to participate.  Your consent will be active for this visit and any virtual visit you may have with one of our providers in the next 365 days.     If you have a MyChart account, a copy of this consent can be sent to you electronically.  All virtual visits are billed to your insurance company just like a traditional visit in the office.    As this is a virtual visit, video technology does not allow for your provider to perform a traditional examination.  This may limit your provider's ability to fully assess your condition.  If your provider identifies any concerns that need to be evaluated in person or the need to arrange testing (such as labs, EKG, etc.), we will make arrangements to do so.     Although advances in technology are sophisticated, we cannot ensure that it will always work on either your end or our end.  If the connection with a video visit is poor, the visit may have to be switched to a telephone visit.  With either a video or telephone visit, we are not always able to ensure that we have a secure connection.     I need to obtain your verbal consent now.   Are you willing to proceed with your visit today?    Rachel Kline has provided verbal consent on 08/12/2020 for a virtual visit (video or telephone).   Leeanne Rio, [place]   Date: 08/12/2020 12:25 PM   Virtual Visit via Video Note   I, Leeanne Rio, PA-C, attempted to connect with Rachel Kline; MRN [id number] on 08/12/20 via [employer] to complete a video urgent care visit. The patient was unable to successfully connect to the video platform. As such, the patient was contacted by this provider via phone to complete the encounter.   Location: Patient: Virtual Visit Location Patient: Home Provider: Virtual Visit  Location Provider: Home Office   I discussed the limitations of evaluation and management by telemedicine and the availability of in person appointments. The patient expressed understanding and agreed to proceed.    History of Present Illness: Rachel Kline is a 65 y.o. who identifies as a female who was assigned female at birth, and is being seen today for COVID-19.  Endorses testing positive at home Monday after noting she woke up with HA, sinus congestion and pressure. Yesterday began developing a dry but "rattly" cough. Now with low-grade fever (< 101). Denies chest pain or SOB. Is taking Dayquil/Nyquil but notes she is just feeling worse.  HPI: HPI  Problems:  Patient Active Problem List   Diagnosis Date Noted   Morbid obesity ([place]) 07/05/2017   Elevated BP without diagnosis of hypertension 07/05/2017   Hyperlipidemia LDL goal <100 01/02/2017   Preventative health care 01/02/2017   Hypothyroidism 05/15/2016   Rash 03/09/2016   History of colonic polyps 09/22/2014   Macular degeneration of both eyes 07/15/2012   FASCIITIS, PLANTAR 03/31/2009   Hyperlipidemia 12/09/2007    Allergies: No Known Allergies Medications:  Current Outpatient Medications:    benzonatate (TESSALON) 100 MG capsule, Take 1 capsule (100 mg total) by mouth 3 (three) times daily as needed for cough., Disp: 30 capsule, Rfl: 0   molnupiravir EUA 200 mg CAPS, Take 4 capsules (  800 mg total) by mouth 2 (two) times daily for 5 days., Disp: 40 capsule, Rfl: 0   alendronate (FOSAMAX) 70 MG tablet, TAKE 1 TABLET (70 MG TOTAL) BY MOUTH EVERY 7 (SEVEN) DAYS. TAKE WITH A FULL GLASS OF WATER ON AN EMPTY STOMACH., Disp: 12 tablet, Rfl: 3   atorvastatin (LIPITOR) 20 MG tablet, TAKE 1 TABLET BY MOUTH ONCE DAILY, Disp: 90 tablet, Rfl: 1   Besifloxacin HCl (BESIVANCE) 0.6 % SUSP, instill one drop into left eye 4 times a day for 2 days after each monthly eye injection, Disp: 5 mL, Rfl: 12   Calcium-Vitamin D-Vitamin K 500-500-40  MG-UNT-MCG CHEW, 1 po tid, Disp: 60 tablet, Rfl:    cetirizine (ZYRTEC) 10 MG tablet, Take 10 mg by mouth daily., Disp: , Rfl:    Coenzyme Q10 (COQ10) 30 MG CAPS, Take by mouth 2 (two) times daily., Disp: , Rfl:    COVID-19 mRNA vaccine, Pfizer, 30 MCG/0.3ML injection, INJECT AS DIRECTED, Disp: .3 mL, Rfl: 0   levothyroxine (SYNTHROID) 50 MCG tablet, Take 1 tablet (50 mcg total) by mouth daily before breakfast., Disp: 90 tablet, Rfl: 1   Multiple Vitamins-Iron (MULTIVITAMINS WITH IRON) TABS, Take 1 tablet by mouth daily., Disp: , Rfl: 0   Multiple Vitamins-Minerals (ICAPS AREDS 2) CAPS, Take 2 capsules by mouth daily., Disp: , Rfl:    Omega-3 350 MG CAPS, Take by mouth., Disp: , Rfl:    sodium fluoride (FLUORISHIELD) 1.1 % GEL dental gel, APPLY A THIN RIBBON OR PEA SIZED AMOUNT SIZED AMOUNT TO TOOTHBRUSH AND BRUSH THROUGHLY FOR 2 MINUTES IN PLACE OF TOOTHPASTE, Disp: 100 mL, Rfl: 99  Observations/Objective: Patient is well-developed, well-nourished in no acute distress.  Resting comfortable at home.  Head is normocephalic, atraumatic.  No labored breathing. Speech is clear and coherent with logical content.  Patient is alert and oriented at baseline.   Assessment and Plan: 1. COVID-19 - molnupiravir EUA 200 mg CAPS; Take 4 capsules (800 mg total) by mouth 2 (two) times daily for 5 days.  Dispense: 40 capsule; Refill: 0 - MyChart COVID-19 home monitoring program; Future - benzonatate (TESSALON) 100 MG capsule; Take 1 capsule (100 mg total) by mouth 3 (three) times daily as needed for cough.  Dispense: 30 capsule; Refill: 0 Patient with multiple risk factors for complicated course of illness. Discussed risks/benefits of antiviral medications including most common potential ADRs. Patient voiced understanding and would like to proceed with antiviral medication. They are candidate for molnupiravir. Rx sent to pharmacy. Supportive measures, OTC medications and vitamin regimen reviewed. Tessalon per  orders. Patient has been enrolled in a MyChart COVID symptom monitoring program. Samule Dry reviewed in detail. Strict ER precautions discussed with patient.    Follow Up Instructions: I discussed the assessment and treatment plan with the patient. The patient was provided an opportunity to ask questions and all were answered. The patient agreed with the plan and demonstrated an understanding of the instructions.  A copy of instructions were sent to the patient via MyChart.  The patient was advised to call back or seek an in-person evaluation if the symptoms worsen or if the condition fails to improve as anticipated.  Time:  I spent 14 minutes with the patient via telehealth technology discussing the above problems/concerns.    Leeanne Rio, PA-C

## 2020-08-13 ENCOUNTER — Telehealth: Payer: Self-pay

## 2020-08-13 NOTE — Telephone Encounter (Signed)
Patient states that she is taking Mucinex and Tessalon [place]. Patient was advised per protocol for worsening cough. Patient will continue to rest and hydrate.

## 2020-08-13 NOTE — Telephone Encounter (Signed)
Vm left for patient regarding questionnaire and worsening symptoms. My chart message also sent with protocol for worsening cough.

## 2020-08-18 ENCOUNTER — Encounter: Payer: Self-pay | Admitting: Family Medicine

## 2020-08-19 ENCOUNTER — Telehealth: Payer: No Typology Code available for payment source | Admitting: Physician Assistant

## 2020-08-19 DIAGNOSIS — R300 Dysuria: Secondary | ICD-10-CM

## 2020-08-19 MED ORDER — CEPHALEXIN 500 MG PO CAPS: 500.0000 mg | ORAL_CAPSULE | Freq: Two times a day (BID) | ORAL | 0 refills | Status: AC

## 2020-08-19 NOTE — Progress Notes (Signed)
I have spent 5 minutes in review of e-visit questionnaire, review and updating patient chart, medical decision making and response to patient.   Jeanann Balinski Cody Delona Clasby, PA-C    

## 2020-08-19 NOTE — Progress Notes (Signed)

## 2020-08-25 ENCOUNTER — Encounter (INDEPENDENT_AMBULATORY_CARE_PROVIDER_SITE_OTHER): Payer: No Typology Code available for payment source | Admitting: Ophthalmology

## 2020-08-25 ENCOUNTER — Other Ambulatory Visit: Payer: Self-pay

## 2020-08-25 DIAGNOSIS — H353231 Exudative age-related macular degeneration, bilateral, with active choroidal neovascularization: Secondary | ICD-10-CM | POA: Diagnosis not present

## 2020-08-25 DIAGNOSIS — H43813 Vitreous degeneration, bilateral: Secondary | ICD-10-CM

## 2020-08-25 DIAGNOSIS — H2513 Age-related nuclear cataract, bilateral: Secondary | ICD-10-CM | POA: Diagnosis not present

## 2020-08-30 ENCOUNTER — Other Ambulatory Visit (HOSPITAL_BASED_OUTPATIENT_CLINIC_OR_DEPARTMENT_OTHER): Payer: Self-pay

## 2020-08-30 MED FILL — Sodium Fluoride Gel 1.1% (0.5% F): DENTAL | 30 days supply | Qty: 100 | Fill #1 | Status: AC

## 2020-09-13 ENCOUNTER — Other Ambulatory Visit (HOSPITAL_BASED_OUTPATIENT_CLINIC_OR_DEPARTMENT_OTHER): Payer: Self-pay

## 2020-09-13 MED FILL — Alendronate Sodium Tab 70 MG: ORAL | 84 days supply | Qty: 12 | Fill #1 | Status: AC

## 2020-09-14 ENCOUNTER — Other Ambulatory Visit (HOSPITAL_BASED_OUTPATIENT_CLINIC_OR_DEPARTMENT_OTHER): Payer: Self-pay

## 2020-09-16 ENCOUNTER — Telehealth: Payer: No Typology Code available for payment source | Admitting: Nurse Practitioner

## 2020-09-16 ENCOUNTER — Other Ambulatory Visit (HOSPITAL_BASED_OUTPATIENT_CLINIC_OR_DEPARTMENT_OTHER): Payer: Self-pay

## 2020-09-16 DIAGNOSIS — B372 Candidiasis of skin and nail: Secondary | ICD-10-CM

## 2020-09-16 MED ORDER — NYSTATIN 100000 UNIT/GM EX CREA
1.0000 | TOPICAL_CREAM | Freq: Two times a day (BID) | CUTANEOUS | 0 refills | Status: DC
Start: 2020-09-16 — End: 2020-10-28
  Filled 2020-09-16: qty 30, 15d supply, fill #0

## 2020-09-16 NOTE — Progress Notes (Signed)
E Visit for Rash  We are sorry that you are not feeling well. Here is how we plan to help!  Based upon your presentation it appears you have a fungal infection.  I have prescribed: and Nystatin cream apply to the affected area twice daily  Fungal or yeast infections like to occur in areas that are sweaty. It is best to try and wear cotton clothing or undershirts. To avoid tight fitting clothing or bras. Allow area to dry completely after showering, and change clothing often when sweating.   Keeping the skin dry aside from the prescription cream will help the rash. Fungal infections can take awhile to resolve as well. You may need to use the cream for 2 or more weeks. If you are not seeing improvement after one week, or if symptoms worsen we would recommend and in person appointment with your primary care provider for assessment.   HOME CARE:  Take cool showers and avoid direct sunlight. Apply cool compress or wet dressings. Take a bath in an oatmeal bath.  Sprinkle content of one Aveeno packet under running faucet with comfortably warm water.  Bathe for 15-20 minutes, 1-2 times daily.  Pat dry with a towel. Do not rub the rash. Take an antihistamine like Benadryl for widespread rashes that itch.  The adult dose of Benadryl is 25-50 mg by mouth 4 times daily. Caution:  This type of medication may cause sleepiness.  Do not drink alcohol, drive, or operate dangerous machinery while taking antihistamines.  Do not take these medications if you have prostate enlargement.  Read package instructions thoroughly on all medications that you take.  GET HELP RIGHT AWAY IF:  Symptoms don't go away after treatment. Severe itching that persists. If you rash spreads or swells. If you rash begins to smell. If it blisters and opens or develops a yellow-brown crust. You develop a fever. You have a sore throat. You become short of breath.  MAKE SURE YOU:  Understand these instructions. Will watch your  condition. Will get help right away if you are not doing well or get worse.  Thank you for choosing an e-visit.  Your e-visit answers were reviewed by a board certified advanced clinical practitioner to complete your personal care plan. Depending upon the condition, your plan could have included both over the counter or prescription medications.  Please review your pharmacy choice. Make sure the pharmacy is open so you can pick up prescription now. If there is a problem, you may contact your provider through [employer] and have the prescription routed to another pharmacy.  Your safety is important to [place]. If you have drug allergies check your prescription carefully.   For the next 24 hours you can use MyChart to ask questions about today's visit, request a non-urgent call back, or ask for a work or school excuse. You will get an email in the next two days asking about your experience. I hope that your e-visit has been valuable and will speed your recovery.   Meds ordered this encounter  Medications   nystatin cream (MYCOSTATIN)    Sig: Apply 1 application topically 2 (two) times daily.    Dispense:  30 g    Refill:  0     I spent approximately 7 minutes reviewing the patient's history, current symptoms and coordinating their plan of care today.

## 2020-09-27 ENCOUNTER — Other Ambulatory Visit (HOSPITAL_BASED_OUTPATIENT_CLINIC_OR_DEPARTMENT_OTHER): Payer: Self-pay

## 2020-10-20 ENCOUNTER — Encounter (INDEPENDENT_AMBULATORY_CARE_PROVIDER_SITE_OTHER): Payer: No Typology Code available for payment source | Admitting: Ophthalmology

## 2020-10-20 ENCOUNTER — Other Ambulatory Visit: Payer: Self-pay

## 2020-10-20 DIAGNOSIS — H353231 Exudative age-related macular degeneration, bilateral, with active choroidal neovascularization: Secondary | ICD-10-CM | POA: Diagnosis not present

## 2020-10-20 DIAGNOSIS — H43813 Vitreous degeneration, bilateral: Secondary | ICD-10-CM | POA: Diagnosis not present

## 2020-10-28 ENCOUNTER — Ambulatory Visit (INDEPENDENT_AMBULATORY_CARE_PROVIDER_SITE_OTHER): Payer: No Typology Code available for payment source | Admitting: Family Medicine

## 2020-10-28 ENCOUNTER — Encounter: Payer: Self-pay | Admitting: Family Medicine

## 2020-10-28 ENCOUNTER — Other Ambulatory Visit: Payer: Self-pay

## 2020-10-28 VITALS — BP 138/80 | HR 75 | Temp 98.3°F | Resp 18 | Ht 64.0 in | Wt 172.8 lb

## 2020-10-28 DIAGNOSIS — E785 Hyperlipidemia, unspecified: Secondary | ICD-10-CM | POA: Diagnosis not present

## 2020-10-28 DIAGNOSIS — Z23 Encounter for immunization: Secondary | ICD-10-CM | POA: Diagnosis not present

## 2020-10-28 DIAGNOSIS — E039 Hypothyroidism, unspecified: Secondary | ICD-10-CM

## 2020-10-28 LAB — COMPREHENSIVE METABOLIC PANEL
ALT: 26 U/L (ref 0–35)
AST: 26 U/L (ref 0–37)
Albumin: 4.5 g/dL (ref 3.5–5.2)
Alkaline Phosphatase: 45 U/L (ref 39–117)
BUN: 12 mg/dL (ref 6–23)
CO2: 29 mEq/L (ref 19–32)
Calcium: 9.5 mg/dL (ref 8.4–10.5)
Chloride: 103 mEq/L (ref 96–112)
Creatinine, Ser: 0.65 mg/dL (ref 0.40–1.20)
GFR: 91.01 mL/min (ref >60.00)
Glucose, Bld: 104 mg/dL — ABNORMAL HIGH (ref 70–99)
Potassium: 4.5 mEq/L (ref 3.5–5.1)
Sodium: 138 mEq/L (ref 135–145)
Total Bilirubin: 0.5 mg/dL (ref 0.2–1.2)
Total Protein: 6.6 g/dL (ref 6.0–8.3)

## 2020-10-28 LAB — LIPID PANEL
Cholesterol: 162 mg/dL (ref 0–200)
HDL: 53.3 mg/dL (ref >39.00)
LDL Cholesterol: 83 mg/dL (ref 0–99)
NonHDL: 108.97
Total CHOL/HDL Ratio: 3
Triglycerides: 129 mg/dL (ref 0.0–149.0)
VLDL: 25.8 mg/dL (ref 0.0–40.0)

## 2020-10-28 LAB — TSH: TSH: 1.11 u[IU]/mL (ref 0.35–5.50)

## 2020-10-28 NOTE — Assessment & Plan Note (Signed)
Encourage heart healthy diet such as MIND or DASH diet, increase exercise, avoid trans fats, simple carbohydrates and processed foods, consider a krill or fish or flaxseed oil cap daily.  °

## 2020-10-28 NOTE — Patient Instructions (Signed)
Lipid Profile Test Why am I having this test? The lipid profile test can be used to help evaluate your risk for developing heart disease. The test is also used to monitor your levels during treatment for high cholesterol to see if you are reaching your goals. What is being tested? A lipid profile measures the following: Total cholesterol. Cholesterol is a waxy, fat-like substance in your blood. If your total cholesterol level is high, this can increase your risk for heart disease. High-density lipoprotein (HDL). This is known as the good cholesterol. Having high levels of HDL decreases your risk for heart disease. Your HDL level may be low if you smoke or do not get enough exercise. Low-density lipoprotein (LDL). This is known as the bad cholesterol. This type causes plaque to build up in your arteries. Having a low level of LDL is best. Having high levels of LDL increases your risk for heart disease. Cholesterol to HDL ratio. This is calculated by dividing your total cholesterol by your HDL cholesterol. The ratio is used by health care providers to determine your risk for heart disease. A low ratio is best. Triglycerides. These are fats that your body can store or burn for energy. Low levels are best. Having high levels of triglycerides increases your risk for heart disease. What kind of sample is taken? A blood sample is required for this test. It is usually collected by inserting a needle into a blood vessel. How do I prepare for this test? Follow instructions from your health care provider about changing or stopping your regular medicines. Do not eat or drink anything except water starting 9-12 hours before your test, or as long as told by your health care provider. Do not drink alcohol starting at least 24 hours before your test. Follow any instructions from your health care provider about dietary restrictions before your test. Tell a health care provider about: All medicines you are taking,  including vitamins, herbs, eye drops, creams, and over-the-counter medicines. Any medical conditions you have. Whether you are pregnant or may be pregnant. How are the results reported? Your test results will be reported as values that indicate your cholesterol and triglyceride levels. Your health care provider will compare your results to normal ranges that were established after testing a large group of people (reference ranges). Reference ranges may vary among labs and hospitals. For this test, common reference ranges are: Total cholesterol Adult or elderly: less than 200 mg/dL. Child: 120-200 mg/dL. HDL Female: greater than 45 mg/dL. Female: greater than 55 mg/dL. HDL reference values indicating your risk for heart disease: Low risk for heart disease: Female: 60 mg/dL. Female: 70 mg/dL. Moderate risk for heart disease: Female: 45 mg/dL. Female: 55 mg/dL. High risk for heart disease: Female: 25 mg/dL. Female: 35 mg/dL. LDL Adults: Your health care provider will determine a target level for LDL based on your risk for heart disease. If you are at low risk, your LDL should be 130 mg/dL or less. If you are at moderate risk, your LDL should be 100 mg/dL or less. If you are at high risk, your LDL should be 70 mg/dL or less. Children: less than 110 mg/dL. Cholesterol to HDL ratio Reference values indicating your risk for heart disease: Risk that is half the average risk: Female: 3.4. Female: 3.3. Average risk: Female: 5.0. Female: 4.4. Risk that is two times average (moderate risk): Female: 10.0. Female: 7.0. Risk that is three times average (high risk): Female: 24.0. Female: 11.0. Triglycerides Adult or elderly:  Female: 40-160 mg/dL. Female: 35-135 mg/dL. Children 9-59 years old: Female: 40-163 mg/dL. Female: 40-128 mg/dL. Children 34-9 years old: Female: 36-138 mg/dL. Female: 41-138 mg/dL. Children 66-40 years old: Female: 31-108 mg/dL. Female: 35-114 mg/dL. Children 0-5 years  old: Female: 30-86 mg/dL. Female: 32-99 mg/dL. Triglycerides should be less than 400 mg/dL even when you are not fasting. What do the results mean? Results that are within the reference ranges are considered normal. Total cholesterol, LDL, and triglyceride levels that are higher than the reference ranges can mean that you have an increased risk for heart disease. An HDL level that is lower than the reference range can also indicate an increased risk. Talk with your health care provider about what your results mean. Questions to ask your health care provider Ask your health care provider, or the department that is doing the test: When will my results be ready? How will I get my results? What are my treatment options? What other tests do I need? What are my next steps? Summary The lipid profile test can be used to help predict the likelihood that you will develop heart disease. It can also help monitor your cholesterol levels during treatment. A lipid profile measures your total cholesterol, high-density lipoprotein (HDL), low-density lipoprotein (LDL), cholesterol to HDL ratio, and triglycerides. Total cholesterol, LDL, and triglyceride levels that are higher than the reference ranges can indicate an increased risk for heart disease. An HDL level that is lower than the reference range can indicate an increased risk for heart disease. Talk with your health care provider about what your results mean. This information is not intended to replace advice given to you by your health care provider. Make sure you discuss any questions you have with your health care provider. Document Revised: 04/14/2020 Document Reviewed: 04/14/2020 Elsevier Patient Education  [place].

## 2020-10-28 NOTE — Progress Notes (Signed)
Subjective:   By signing my name below, I, [name], attest that this documentation has been prepared under the direction and in the presence of Dr. Roma Schanz, DO. 10/28/2020     Patient ID: Rachel Kline, female    DOB: 06/21/1955, 65 y.o.   MRN: [id number]  Chief Complaint  Patient presents with   Hyperlipidemia   Hypothyroidism   Follow-up    Hyperlipidemia Pertinent negatives include no chest pain, myalgias or shortness of breath.  Patient is in today for a office visit.  She denies having any new issues during this visit. She denies having any swelling in her legs. She is UTD on all medication refills at this time.  She is completing lab work during this visit. She is checking cholesterol and thyroid levels during this lab work.  She is receiving a flu vaccine during this visit.    Past Medical History:  Diagnosis Date   Contact lens/glasses fitting    wears contacts or glasses   Hyperlipidemia    Macular degeneration 07/02/2012   Primary hypertension 10/24/2019    Past Surgical History:  Procedure Laterality Date   COLONOSCOPY     GANGLION CYST EXCISION  6/99   rt wrist   TONSILLECTOMY N/A 11/22/2012   Procedure: TONSILLECTOMY;  Surgeon: Rozetta Nunnery, MD;  Location: [facility];  Service: ENT;  Laterality: N/A;    Family History  Problem Relation Age of Onset   Pancreatic cancer Mother    Diabetes Mother    Heart failure Father    Kidney failure Father        end stage   Heart disease Father        chf, cabg   Hypertension Father    Hypertension Brother    Hyperlipidemia Brother    Coronary artery disease Other        female 1st degree relative <50    Social History   Socioeconomic History   Marital status: Married    Spouse name: Not on file   Number of children: Not on file   Years of education: Not on file   Highest education level: Not on file  Occupational History   Occupation: Optician, dispensing:  [employer]   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  Tobacco Use   Smoking status: Never   Smokeless tobacco: Never  Substance and Sexual Activity   Alcohol use: Yes    Comment: rare   Drug use: No   Sexual activity: Yes    Partners: Male  Other Topics Concern   Not on file  Social History Narrative   Exercise-- 30 min daily    Social Determinants of Health   Financial Resource Strain: Not on file  Food Insecurity: Not on file  Transportation Needs: Not on file  Physical Activity: Not on file  Stress: Not on file  Social Connections: Not on file  Intimate Partner Violence: Not on file    Outpatient Medications Prior to Visit  Medication Sig Dispense Refill   alendronate (FOSAMAX) 70 MG tablet TAKE 1 TABLET (70 MG TOTAL) BY MOUTH EVERY 7 (SEVEN) DAYS. TAKE WITH A FULL GLASS OF WATER ON AN EMPTY STOMACH. 12 tablet 3   atorvastatin (LIPITOR) 20 MG tablet TAKE 1 TABLET BY MOUTH ONCE DAILY 90 tablet 1   Besifloxacin HCl (BESIVANCE) 0.6 % SUSP instill one drop into left eye 4 times a day for 2 days  after each monthly eye injection 5 mL 12   Calcium-Vitamin D-Vitamin K 500-500-40 MG-UNT-MCG CHEW 1 po tid 60 tablet    cetirizine (ZYRTEC) 10 MG tablet Take 10 mg by mouth daily.     Coenzyme Q10 (COQ10) 30 MG CAPS Take by mouth 2 (two) times daily.     levothyroxine (SYNTHROID) 50 MCG tablet Take 1 tablet (50 mcg total) by mouth daily before breakfast. 90 tablet 1   Multiple Vitamins-Iron (MULTIVITAMINS WITH IRON) TABS Take 1 tablet by mouth daily.  0   Multiple Vitamins-Minerals (ICAPS AREDS 2) CAPS Take 2 capsules by mouth daily.     Omega-3 350 MG CAPS Take by mouth.     sodium fluoride (FLUORISHIELD) 1.1 % GEL dental gel APPLY A THIN RIBBON OR PEA SIZED AMOUNT SIZED AMOUNT TO TOOTHBRUSH AND BRUSH THROUGHLY FOR 2 MINUTES IN PLACE OF TOOTHPASTE 100 mL 99   benzonatate (TESSALON) 100 MG capsule Take 1 capsule (100 mg total) by mouth 3 (three)  times daily as needed for cough. 30 capsule 0   COVID-19 mRNA vaccine, Pfizer, 30 MCG/0.3ML injection INJECT AS DIRECTED .3 mL 0   nystatin cream (MYCOSTATIN) Apply 1 application topically 2 (two) times daily. 30 g 0   No facility-administered medications prior to visit.    No Known Allergies  Review of Systems  Constitutional:  Negative for chills, fever and malaise/fatigue.  HENT:  Negative for congestion and hearing loss.   Eyes:  Negative for discharge.  Respiratory:  Negative for cough, sputum production and shortness of breath.   Cardiovascular:  Negative for chest pain, palpitations and leg swelling.  Gastrointestinal:  Negative for abdominal pain, blood in stool, constipation, diarrhea, heartburn, nausea and vomiting.  Genitourinary:  Negative for dysuria, frequency, hematuria and urgency.  Musculoskeletal:  Negative for back pain, falls and myalgias.  Skin:  Negative for rash.  Neurological:  Negative for dizziness, sensory change, loss of consciousness, weakness and headaches.  Endo/Heme/Allergies:  Negative for environmental allergies. Does not bruise/bleed easily.  Psychiatric/Behavioral:  Negative for depression and suicidal ideas. The patient is not nervous/anxious and does not have insomnia.       Objective:    Physical Exam Vitals and nursing note reviewed.  Constitutional:      General: She is not in acute distress.    Appearance: Normal appearance. She is not ill-appearing.  HENT:     Head: Normocephalic and atraumatic.     Right Ear: External ear normal.     Left Ear: External ear normal.  Eyes:     Extraocular Movements: Extraocular movements intact.     Pupils: Pupils are equal, round, and reactive to light.  Cardiovascular:     Rate and Rhythm: Normal rate and regular rhythm.     Heart sounds: Normal heart sounds. No murmur heard.   No gallop.  Pulmonary:     Effort: Pulmonary effort is normal. No respiratory distress.     Breath sounds: Normal breath  sounds. No wheezing or rales.  Musculoskeletal:     Right lower leg: No edema.     Left lower leg: No edema.  Skin:    General: Skin is warm and dry.  Neurological:     Mental Status: She is alert and oriented to person, place, and time.  Psychiatric:        Behavior: Behavior normal.        Judgment: Judgment normal.    BP 138/80 (BP Location: Left Arm, Patient Position: Sitting, Cuff Size:  Normal)   Pulse 75   Temp 98.3 F (36.8 C) (Oral)   Resp 18   Ht 5\' 4"  (1.626 m)   Wt 172 lb 12.8 oz (78.4 kg)   SpO2 97%   BMI 29.66 kg/m  Wt Readings from Last 3 Encounters:  10/28/20 172 lb 12.8 oz (78.4 kg)  04/27/20 175 lb 9.6 oz (79.7 kg)  10/24/19 177 lb 3.2 oz (80.4 kg)    Diabetic Foot Exam - Simple   No data filed    Lab Results  Component Value Date   WBC 5.5 04/27/2020   HGB 15.1 (H) 04/27/2020   HCT 44.5 04/27/2020   PLT 253.0 04/27/2020   GLUCOSE 93 04/27/2020   CHOL 161 04/27/2020   TRIG 119.0 04/27/2020   HDL 52.90 04/27/2020   LDLDIRECT 149.7 03/31/2009   LDLCALC 85 04/27/2020   ALT 27 04/27/2020   AST 26 04/27/2020   NA 137 04/27/2020   K 4.5 04/27/2020   CL 102 04/27/2020   CREATININE 0.59 04/27/2020   BUN 10 04/27/2020   CO2 27 04/27/2020   TSH 0.94 04/27/2020   MICROALBUR 0.2 07/15/2012    Lab Results  Component Value Date   TSH 0.94 04/27/2020   Lab Results  Component Value Date   WBC 5.5 04/27/2020   HGB 15.1 (H) 04/27/2020   HCT 44.5 04/27/2020   MCV 94.2 04/27/2020   PLT 253.0 04/27/2020   Lab Results  Component Value Date   NA 137 04/27/2020   K 4.5 04/27/2020   CO2 27 04/27/2020   GLUCOSE 93 04/27/2020   BUN 10 04/27/2020   CREATININE 0.59 04/27/2020   BILITOT 0.5 04/27/2020   ALKPHOS 54 04/27/2020   AST 26 04/27/2020   ALT 27 04/27/2020   PROT 7.0 04/27/2020   ALBUMIN 4.5 04/27/2020   CALCIUM 9.5 04/27/2020   GFR 93.49 04/27/2020   Lab Results  Component Value Date   CHOL 161 04/27/2020   Lab Results  Component  Value Date   HDL 52.90 04/27/2020   Lab Results  Component Value Date   LDLCALC 85 04/27/2020   Lab Results  Component Value Date   TRIG 119.0 04/27/2020   Lab Results  Component Value Date   CHOLHDL 3 04/27/2020   No results found for: HGBA1C     Assessment & Plan:   Problem List Items Addressed This Visit       Unprioritized   Hyperlipidemia    Encourage heart healthy diet such as MIND or DASH diet, increase exercise, avoid trans fats, simple carbohydrates and processed foods, consider a krill or fish or flaxseed oil cap daily.       Relevant Orders   Comprehensive metabolic panel   Lipid panel   Hypothyroidism - Primary    Check labs con't meds       Relevant Orders   TSH   Other Visit Diagnoses     Need for influenza vaccination       Relevant Orders   Flu Vaccine QUAD High Dose(Fluad) (Completed)        No orders of the defined types were placed in this encounter.   I, Dr. Roma Schanz, DO, personally preformed the services described in this documentation.  All medical record entries made by the scribe were at my direction and in my presence.  I have reviewed the chart and discharge instructions (if applicable) and agree that the record reflects my personal performance and is accurate and complete. 10/28/2020  I,[name],acting as a Education administrator for [employer], DO.,have documented all relevant documentation on the behalf of [name], DO,as directed by  [name], DO while in the presence of [name], DO.   [name], DO

## 2020-10-28 NOTE — Assessment & Plan Note (Signed)
Check labs con't meds 

## 2020-11-18 ENCOUNTER — Telehealth: Payer: No Typology Code available for payment source | Admitting: Family Medicine

## 2020-11-18 DIAGNOSIS — R21 Rash and other nonspecific skin eruption: Secondary | ICD-10-CM

## 2020-11-18 MED ORDER — PREDNISONE 10 MG (21) PO TBPK: ORAL_TABLET | ORAL | 0 refills | Status: DC

## 2020-11-18 NOTE — Progress Notes (Signed)
E Visit for Rash  We are sorry that you are not feeling well. Here is how we plan to help!  Prednisone 10 mg daily for 6 days (see taper instructions below)   HOME CARE:  Take cool showers and avoid direct sunlight. Apply cool compress or wet dressings. Take a bath in an oatmeal bath.  Sprinkle content of one Aveeno packet under running faucet with comfortably warm water.  Bathe for 15-20 minutes, 1-2 times daily.  Pat dry with a towel. Do not rub the rash. Use hydrocortisone cream. Take an antihistamine like Benadryl for widespread rashes that itch.  The adult dose of Benadryl is 25-50 mg by mouth 4 times daily. Caution:  This type of medication may cause sleepiness.  Do not drink alcohol, drive, or operate dangerous machinery while taking antihistamines.  Do not take these medications if you have prostate enlargement.  Read package instructions thoroughly on all medications that you take.  GET HELP RIGHT AWAY IF:  Symptoms don't go away after treatment. Severe itching that persists. If you rash spreads or swells. If you rash begins to smell. If it blisters and opens or develops a yellow-brown crust. You develop a fever. You have a sore throat. You become short of breath.  MAKE SURE YOU:  Understand these instructions. Will watch your condition. Will get help right away if you are not doing well or get worse.  Thank you for choosing an e-visit.  Your e-visit answers were reviewed by a board certified advanced clinical practitioner to complete your personal care plan. Depending upon the condition, your plan could have included both over the counter or prescription medications.  Please review your pharmacy choice. Make sure the pharmacy is open so you can pick up prescription now. If there is a problem, you may contact your provider through [employer] and have the prescription routed to another pharmacy.  Your safety is important to [place]. If you have drug allergies check your  prescription carefully.   For the next 24 hours you can use MyChart to ask questions about today's visit, request a non-urgent call back, or ask for a work or school excuse. You will get an email in the next two days asking about your experience. I hope that your e-visit has been valuable and will speed your recovery.  I provided 5 minutes of non face-to-face time during this encounter for chart review, medication and order placement, as well as and documentation.

## 2020-11-26 ENCOUNTER — Telehealth: Payer: No Typology Code available for payment source | Admitting: Nurse Practitioner

## 2020-11-26 DIAGNOSIS — L239 Allergic contact dermatitis, unspecified cause: Secondary | ICD-10-CM | POA: Diagnosis not present

## 2020-11-27 MED ORDER — PREDNISONE 20 MG PO TABS: 20.0000 mg | ORAL_TABLET | Freq: Every day | ORAL | 0 refills | Status: AC

## 2020-11-27 NOTE — Progress Notes (Signed)

## 2020-11-27 NOTE — Progress Notes (Signed)
I have spent 5 minutes in review of e-visit questionnaire, review and updating patient chart, medical decision making and response to patient.  ° °Alois Colgan W Traveion Ruddock, NP ° °  °

## 2020-12-01 ENCOUNTER — Other Ambulatory Visit (HOSPITAL_BASED_OUTPATIENT_CLINIC_OR_DEPARTMENT_OTHER): Payer: Self-pay

## 2020-12-01 ENCOUNTER — Other Ambulatory Visit: Payer: Self-pay | Admitting: Family Medicine

## 2020-12-01 DIAGNOSIS — E785 Hyperlipidemia, unspecified: Secondary | ICD-10-CM

## 2020-12-01 MED ORDER — ATORVASTATIN CALCIUM 20 MG PO TABS
ORAL_TABLET | Freq: Every day | ORAL | 1 refills | Status: DC
  Filled 2020-12-01: qty 90, fill #0
  Filled 2020-12-03: qty 90, 90d supply, fill #0
  Filled 2020-12-28 – 2021-02-28 (×3): qty 90, 90d supply, fill #1

## 2020-12-01 MED ORDER — ALENDRONATE SODIUM 70 MG PO TABS
ORAL_TABLET | ORAL | 3 refills | Status: DC
  Filled 2020-12-01: qty 12, fill #0
  Filled 2020-12-03: qty 12, 84d supply, fill #0
  Filled 2021-02-28: qty 12, 84d supply, fill #1
  Filled 2021-05-21: qty 12, 84d supply, fill #2
  Filled 2021-08-15: qty 12, 84d supply, fill #3

## 2020-12-01 MED FILL — Sodium Fluoride Gel 1.1% (0.5% F): DENTAL | 30 days supply | Qty: 100 | Fill #2 | Status: AC

## 2020-12-03 ENCOUNTER — Other Ambulatory Visit (HOSPITAL_BASED_OUTPATIENT_CLINIC_OR_DEPARTMENT_OTHER): Payer: Self-pay

## 2020-12-07 ENCOUNTER — Other Ambulatory Visit (HOSPITAL_BASED_OUTPATIENT_CLINIC_OR_DEPARTMENT_OTHER): Payer: Self-pay

## 2020-12-07 ENCOUNTER — Other Ambulatory Visit: Payer: Self-pay

## 2020-12-07 ENCOUNTER — Telehealth: Payer: Self-pay

## 2020-12-07 ENCOUNTER — Ambulatory Visit (INDEPENDENT_AMBULATORY_CARE_PROVIDER_SITE_OTHER): Payer: No Typology Code available for payment source | Admitting: Family Medicine

## 2020-12-07 ENCOUNTER — Encounter: Payer: Self-pay | Admitting: Family Medicine

## 2020-12-07 VITALS — BP 124/76 | HR 80 | Temp 98.1°F | Ht 64.0 in | Wt 174.5 lb

## 2020-12-07 DIAGNOSIS — L282 Other prurigo: Secondary | ICD-10-CM | POA: Diagnosis not present

## 2020-12-07 MED ORDER — PREDNISONE 20 MG PO TABS
40.0000 mg | ORAL_TABLET | Freq: Every day | ORAL | 0 refills | Status: AC
  Filled 2020-12-07: qty 10, 5d supply, fill #0

## 2020-12-07 NOTE — Progress Notes (Signed)
Chief Complaint  Patient presents with   Rash    Rachel Kline is a 65 y.o. female here for a skin complaint.  Duration: 3 weeks Location: entire body Pruritic? Yes Painful? No Drainage? No New soaps/lotions/topicals/detergents? No Sick contacts? No Other associated symptoms: happens every year around this time Therapies tried thus far: Zyrtec, Benadryl at night, HC cream  Past Medical History:  Diagnosis Date   Contact lens/glasses fitting    wears contacts or glasses   Hyperlipidemia    Macular degeneration 07/02/2012   Primary hypertension 10/24/2019    BP 124/76   Pulse 80   Temp 98.1 F (36.7 C) (Oral)   Ht 5\' 4"  (1.626 m)   Wt 174 lb 8 oz (79.2 kg)   SpO2 98%   BMI 29.95 kg/m  Gen: awake, alert, appearing stated age Lungs: CTAB. No stridor. No accessory muscle use Heart: RRR HEENT: No macroglossia, edema or asymmetry Skin: pinkish confluens of macules/patches on R antecubital fossa. No drainage, erythema, TTP, fluctuance, excoriation Psych: Age appropriate judgment and insight  Pruritic rash - Plan: predniSONE (DELTASONE) 20 MG tablet  5 d pred burst. Avoid scented products. Cold products topically. Consider adding Flonase to Zyrtec as preventative next year.  F/u prn. The patient voiced understanding and agreement to the plan.  [place], DO 12/07/20 1:12 PM

## 2020-12-07 NOTE — Patient Instructions (Addendum)
Try not to scratch as this can make things worse. Avoid scented products while dealing with this. You may resume when the itchiness resolves. Cold/cool compresses can help.   Consider adding Flonase next October to see if you can keep things at [place]. Continue the Zyrtec.  Let us know if you need anything.

## 2020-12-07 NOTE — Telephone Encounter (Signed)
Nurse Assessment Nurse: Marlowe Sax, RN, [place] Date/Time Eilene Ghazi Time): 12/06/2020 5:29:56 PM Confirm and document reason for call. If symptomatic, describe symptoms. ---Caller states she is wanting to get an appt. this week. She has a itchy rash on her torso, back, arms. Symptoms started 3 weeks ago. States it keeps spreading. Has tried prednisone and it did help, once she finished it the rash came back. Is taking benadryl and Zyrtec and has tried hydrocortisone cream Does the patient have any new or worsening symptoms? ---Yes Will a triage be completed? ---Yes Related visit to physician within the last 2 weeks? ---Yes Does the PT have any chronic conditions? (i.e. diabetes, asthma, this includes High risk factors for pregnancy, etc.) ---No Is this a behavioral health or substance abuse call? ---No Guidelines Guideline Title Affirmed Question Affirmed Notes Nurse Date/Time (Eastern Time) Rash or Redness - Widespread SEVERE itching (i.e., interferes with sleep, normal activities or school) Marlowe Sax, Therapist, sports, [place] 12/06/2020 5:32:07 PM Disp. Time Eilene Ghazi Time) Disposition Final User 12/06/2020 5:36:19 PM See PCP within 24 Hours Yes Marlowe Sax, RN, Reed Breech PLEASE NOTE: All timestamps contained within this report are represented as [place] Standard Time. CONFIDENTIALTY NOTICE: This fax transmission is intended only for the addressee. It contains information that is legally privileged, confidential or otherwise protected from use or disclosure. If you are not the intended recipient, you are strictly prohibited from reviewing, disclosing, copying using or disseminating any of this information or taking any action in reliance on or regarding this information. If you have received this fax in error, please notify us immediately by telephone so that we can arrange for its return to [place]. Phone: [phone], Toll-Free: [phone], Fax: [phone] Page: 2 of 2 Call Id: [id number] [place]  Disagree/Comply Comply Caller Understands Yes PreDisposition Call Doctor Care Advice Given Per Guideline REDUCING THE ITCH - OATMEAL (AVEENO) BATH: * Sprinkle contents of one packet of Aveeno under running faucet with comfortably warm water. * Bathe for 15 to 20 minutes, 1 to 2 times daily. * Pat dry using towel - do not rub. * Caution: This can make the tub slippery. SEE PCP WITHIN 24 HOURS: * IF OFFICE WILL BE OPEN: You need to be examined within the next 24 hours. Call your doctor (or NP/PA) when the office opens and make an appointment. CALL BACK IF: * Rash becomes purple or blood-colored or blister-like * Fever occurs * You become worse CARE ADVICE given per Rash - Widespread and Cause Unknown (Adult) guideline. Referrals REFERRED TO PCP OFFICE   Appt today w/ Dr. Nani Ravens.

## 2020-12-15 ENCOUNTER — Encounter (INDEPENDENT_AMBULATORY_CARE_PROVIDER_SITE_OTHER): Payer: No Typology Code available for payment source | Admitting: Ophthalmology

## 2020-12-28 ENCOUNTER — Other Ambulatory Visit (HOSPITAL_BASED_OUTPATIENT_CLINIC_OR_DEPARTMENT_OTHER): Payer: Self-pay

## 2020-12-29 ENCOUNTER — Other Ambulatory Visit (HOSPITAL_BASED_OUTPATIENT_CLINIC_OR_DEPARTMENT_OTHER): Payer: Self-pay

## 2020-12-29 ENCOUNTER — Other Ambulatory Visit: Payer: Self-pay | Admitting: Family Medicine

## 2020-12-29 DIAGNOSIS — E038 Other specified hypothyroidism: Secondary | ICD-10-CM

## 2020-12-30 ENCOUNTER — Other Ambulatory Visit (HOSPITAL_BASED_OUTPATIENT_CLINIC_OR_DEPARTMENT_OTHER): Payer: Self-pay

## 2020-12-30 MED ORDER — LEVOTHYROXINE SODIUM 50 MCG PO TABS
50.0000 ug | ORAL_TABLET | Freq: Every day | ORAL | 1 refills | Status: DC
  Filled 2020-12-30: qty 90, 90d supply, fill #0
  Filled 2021-03-30: qty 90, 90d supply, fill #1

## 2021-02-28 ENCOUNTER — Other Ambulatory Visit (HOSPITAL_BASED_OUTPATIENT_CLINIC_OR_DEPARTMENT_OTHER): Payer: Self-pay

## 2021-02-28 ENCOUNTER — Other Ambulatory Visit: Payer: Self-pay | Admitting: Family Medicine

## 2021-02-28 MED ORDER — CETIRIZINE HCL 10 MG PO TABS
10.0000 mg | ORAL_TABLET | Freq: Every day | ORAL | 2 refills | Status: DC
  Filled 2021-02-28: qty 100, 100d supply, fill #0
  Filled 2021-05-21: qty 100, 100d supply, fill #1
  Filled 2021-09-22: qty 100, 100d supply, fill #2

## 2021-02-28 NOTE — Telephone Encounter (Signed)
Patient saw you in November and you advised to continue Zyrtec.  Can we write as rx for her?

## 2021-03-30 ENCOUNTER — Encounter: Payer: Self-pay | Admitting: Family Medicine

## 2021-03-30 DIAGNOSIS — H3530 Unspecified macular degeneration: Secondary | ICD-10-CM

## 2021-03-31 ENCOUNTER — Other Ambulatory Visit (HOSPITAL_BASED_OUTPATIENT_CLINIC_OR_DEPARTMENT_OTHER): Payer: Self-pay

## 2021-05-03 ENCOUNTER — Ambulatory Visit (INDEPENDENT_AMBULATORY_CARE_PROVIDER_SITE_OTHER): Payer: No Typology Code available for payment source | Admitting: Family Medicine

## 2021-05-03 ENCOUNTER — Other Ambulatory Visit: Payer: Self-pay | Admitting: Family Medicine

## 2021-05-03 ENCOUNTER — Encounter: Payer: Self-pay | Admitting: Family Medicine

## 2021-05-03 VITALS — BP 122/72 | HR 67 | Temp 97.7°F | Resp 18 | Ht 64.0 in | Wt 174.2 lb

## 2021-05-03 DIAGNOSIS — E785 Hyperlipidemia, unspecified: Secondary | ICD-10-CM | POA: Diagnosis not present

## 2021-05-03 DIAGNOSIS — Z0000 Encounter for general adult medical examination without abnormal findings: Secondary | ICD-10-CM

## 2021-05-03 DIAGNOSIS — M810 Age-related osteoporosis without current pathological fracture: Secondary | ICD-10-CM

## 2021-05-03 DIAGNOSIS — Z1239 Encounter for other screening for malignant neoplasm of breast: Secondary | ICD-10-CM

## 2021-05-03 DIAGNOSIS — E039 Hypothyroidism, unspecified: Secondary | ICD-10-CM

## 2021-05-03 LAB — LIPID PANEL
Cholesterol: 180 mg/dL (ref 0–200)
HDL: 56.4 mg/dL (ref >39.00)
LDL Cholesterol: 99 mg/dL (ref 0–99)
NonHDL: 123.59
Total CHOL/HDL Ratio: 3
Triglycerides: 123 mg/dL (ref 0.0–149.0)
VLDL: 24.6 mg/dL (ref 0.0–40.0)

## 2021-05-03 LAB — CBC WITH DIFFERENTIAL/PLATELET
Basophils Absolute: 0 10*3/uL (ref 0.0–0.1)
Basophils Relative: 1.1 % (ref 0.0–3.0)
Eosinophils Absolute: 0.1 10*3/uL (ref 0.0–0.7)
Eosinophils Relative: 2.7 % (ref 0.0–5.0)
HCT: 43 % (ref 36.0–46.0)
Hemoglobin: 14.9 g/dL (ref 12.0–15.0)
Lymphocytes Relative: 31.8 % (ref 12.0–46.0)
Lymphs Abs: 1.5 10*3/uL (ref 0.7–4.0)
MCHC: 34.7 g/dL (ref 30.0–36.0)
MCV: 93.3 fl (ref 78.0–100.0)
Monocytes Absolute: 0.4 10*3/uL (ref 0.1–1.0)
Monocytes Relative: 8.7 % (ref 3.0–12.0)
Neutro Abs: 2.5 10*3/uL (ref 1.4–7.7)
Neutrophils Relative %: 55.7 % (ref 43.0–77.0)
Platelets: 247 10*3/uL (ref 150.0–400.0)
RBC: 4.61 Mil/uL (ref 3.87–5.11)
RDW: 12.8 % (ref 11.5–15.5)
WBC: 4.6 10*3/uL (ref 4.0–10.5)

## 2021-05-03 LAB — COMPREHENSIVE METABOLIC PANEL
ALT: 27 U/L (ref 0–35)
AST: 27 U/L (ref 0–37)
Albumin: 4.7 g/dL (ref 3.5–5.2)
Alkaline Phosphatase: 46 U/L (ref 39–117)
BUN: 10 mg/dL (ref 6–23)
CO2: 28 mEq/L (ref 19–32)
Calcium: 9.6 mg/dL (ref 8.4–10.5)
Chloride: 104 mEq/L (ref 96–112)
Creatinine, Ser: 0.67 mg/dL (ref 0.40–1.20)
GFR: 90.02 mL/min (ref >60.00)
Glucose, Bld: 100 mg/dL — ABNORMAL HIGH (ref 70–99)
Potassium: 4.9 mEq/L (ref 3.5–5.1)
Sodium: 139 mEq/L (ref 135–145)
Total Bilirubin: 0.6 mg/dL (ref 0.2–1.2)
Total Protein: 7 g/dL (ref 6.0–8.3)

## 2021-05-03 LAB — TSH: TSH: 1.26 u[IU]/mL (ref 0.35–5.50)

## 2021-05-03 NOTE — Patient Instructions (Signed)
Preventive Care 7 Years and Older, Female ?Preventive care refers to lifestyle choices and visits with your health care provider that can promote health and wellness. Preventive care visits are also called wellness exams. ?What can I expect for my preventive care visit? ?Counseling ?Your health care provider may ask you questions about your: ?Medical history, including: ?Past medical problems. ?Family medical history. ?Pregnancy and menstrual history. ?History of falls. ?Current health, including: ?Memory and ability to understand (cognition). ?Emotional well-being. ?Home life and relationship well-being. ?Sexual activity and sexual health. ?Lifestyle, including: ?Alcohol, nicotine or tobacco, and drug use. ?Access to firearms. ?Diet, exercise, and sleep habits. ?Work and work Statistician. ?Sunscreen use. ?Safety issues such as seatbelt and bike helmet use. ?Physical exam ?Your health care provider will check your: ?Height and weight. These may be used to calculate your BMI (body mass index). BMI is a measurement that tells if you are at a healthy weight. ?Waist circumference. This measures the distance around your waistline. This measurement also tells if you are at a healthy weight and may help predict your risk of certain diseases, such as type 2 diabetes and high blood pressure. ?Heart rate and blood pressure. ?Body temperature. ?Skin for abnormal spots. ?What immunizations do I need? ? ?Vaccines are usually given at various ages, according to a schedule. Your health care provider will recommend vaccines for you based on your age, medical history, and lifestyle or other factors, such as travel or where you work. ?What tests do I need? ?Screening ?Your health care provider may recommend screening tests for certain conditions. This may include: ?Lipid and cholesterol levels. ?Hepatitis C test. ?Hepatitis B test. ?HIV (human immunodeficiency virus) test. ?STI (sexually transmitted infection) testing, if you are at  risk. ?Lung cancer screening. ?Colorectal cancer screening. ?Diabetes screening. This is done by checking your blood sugar (glucose) after you have not eaten for a while (fasting). ?Mammogram. Talk with your health care provider about how often you should have regular mammograms. ?BRCA-related cancer screening. This may be done if you have a family history of breast, ovarian, tubal, or peritoneal cancers. ?Bone density scan. This is done to screen for osteoporosis. ?Talk with your health care provider about your test results, treatment options, and if necessary, the need for more tests. ?Follow these instructions at home: ?Eating and drinking ? ?Eat a diet that includes fresh fruits and vegetables, whole grains, lean protein, and low-fat dairy products. Limit your intake of foods with high amounts of sugar, saturated fats, and salt. ?Take vitamin and mineral supplements as recommended by your health care provider. ?Do not drink alcohol if your health care provider tells you not to drink. ?If you drink alcohol: ?Limit how much you have to 0-1 drink a day. ?Know how much alcohol is in your drink. In the [place] one drink equals one 12 oz bottle of beer (355 mL), one 5 oz glass of wine (148 mL), or one 1? oz glass of hard liquor (44 mL). ?Lifestyle ?Brush your teeth every morning and night with fluoride toothpaste. Floss one time each day. ?Exercise for at least 30 minutes 5 or more days each week. ?Do not use any products that contain nicotine or tobacco. These products include cigarettes, chewing tobacco, and vaping devices, such as e-cigarettes. If you need help quitting, ask your health care provider. ?Do not use drugs. ?If you are sexually active, practice safe sex. Use a condom or other form of protection in order to prevent STIs. ?Take aspirin only as told by  your health care provider. Make sure that you understand how much to take and what form to take. Work with your health care provider to find out whether it  is safe and beneficial for you to take aspirin daily. ?Ask your health care provider if you need to take a cholesterol-lowering medicine (statin). ?Find healthy ways to manage stress, such as: ?Meditation, yoga, or listening to music. ?Journaling. ?Talking to a trusted person. ?Spending time with friends and family. ?Minimize exposure to UV radiation to reduce your risk of skin cancer. ?Safety ?Always wear your seat belt while driving or riding in a vehicle. ?Do not drive: ?If you have been drinking alcohol. Do not ride with someone who has been drinking. ?When you are tired or distracted. ?While texting. ?If you have been using any mind-altering substances or drugs. ?Wear a helmet and other protective equipment during sports activities. ?If you have firearms in your house, make sure you follow all gun safety procedures. ?What's next? ?Visit your health care provider once a year for an annual wellness visit. ?Ask your health care provider how often you should have your eyes and teeth checked. ?Stay up to date on all vaccines. ?This information is not intended to replace advice given to you by your health care provider. Make sure you discuss any questions you have with your health care provider. ?Document Revised: 06/30/2020 Document Reviewed: 06/30/2020 ?Elsevier Patient Education ? [place]. ? ?

## 2021-05-03 NOTE — Assessment & Plan Note (Signed)
Encourage heart healthy diet such as MIND or DASH diet, increase exercise, avoid trans fats, simple carbohydrates and processed foods, consider a krill or fish or flaxseed oil cap daily.  °

## 2021-05-03 NOTE — Progress Notes (Signed)
? ?Subjective:  ? ?By signing my name below, I, Rachel Kline, attest that this documentation has been prepared under the direction and in the presence of [name], DO  05/03/2021 ?  ? ? Patient ID: Rachel Kline, female    DOB: 10/07/1955, 66 y.o.   MRN: [id number] ? ?Chief Complaint  ?Patient presents with  ? Annual Exam  ?  Pt states fasting   ? ? ?HPI ?Patient is in today for a comprehensive physical exam.  ? ?She is inquiring if she is required to continue 70 mg fosamax. She has taken it for the past 2 years. She has no new issues while taking it. She is interested in scheduling a bone density scan.  ?Her GYN specialist retired and she is not seeing another provider at this time. She is not interested in completing pap smears anymore.  ? ?She denies having any fever, ear pain, muscle pain, new joint pain, new moles, congestion, sinus pain, sore throat, eye pain, chest pain, palpations, cough, SOB, wheezing, n/v/d, constipation, blood in stool, dysuria, frequency, hematuria, or headaches at this time. ?She has no changes to her family medical history.  ?She has 3 pfizer Covid-19 vaccines at this time.  ?She continues participating in exercise by walking at least 30 minutes daily.  ? ? ?Past Medical History:  ?Diagnosis Date  ? Contact lens/glasses fitting   ? wears contacts or glasses  ? Hyperlipidemia   ? Macular degeneration 07/02/2012  ? Primary hypertension 10/24/2019  ? ? ?Past Surgical History:  ?Procedure Laterality Date  ? COLONOSCOPY    ? GANGLION CYST EXCISION  6/99  ? rt wrist  ? TONSILLECTOMY N/A 11/22/2012  ? Procedure: TONSILLECTOMY;  Surgeon: Rozetta Nunnery, MD;  Location: [facility];  Service: ENT;  Laterality: N/A;  ? ? ?Family History  ?Problem Relation Age of Onset  ? Pancreatic cancer Mother   ? Diabetes Mother   ? Heart failure Father   ? Kidney failure Father   ?     end stage  ? Heart disease Father   ?     chf, cabg  ? Hypertension Father   ? Hypertension  Brother   ? Hyperlipidemia Brother   ? Coronary artery disease Other   ?     female 1st degree relative <50  ? ? ?Social History  ? ?Socioeconomic History  ? Marital status: Married  ?  Spouse name: Not on file  ? Number of children: Not on file  ? Years of education: Not on file  ? Highest education level: Not on file  ?Occupational History  ? Occupation: INSURANCE  ?  Employer: [employer]  ? 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  Comment: vascular and vein specialist  ?Tobacco Use  ? Smoking status: Never  ? Smokeless tobacco: Never  ?Substance and Sexual Activity  ? Alcohol use: Yes  ?  Comment: rare  ? Drug use: No  ? Sexual activity: Yes  ?  Partners: Male  ?Other Topics Concern  ? Not on file  ?Social History Narrative  ? Exercise-- 30 min daily   ? ?Social Determinants of Health  ? ?Financial Resource Strain: Not on file  ?Food Insecurity: Not on file  ?Transportation Needs: Not on file  ?Physical Activity: Not on file  ?Stress: Not on file  ?Social Connections: Not on file  ?Intimate Partner Violence: Not on file  ? ? ?Outpatient Medications Prior to Visit  ?Medication Sig  Dispense Refill  ? alendronate (FOSAMAX) 70 MG tablet TAKE 1 TABLET (70 MG TOTAL) BY MOUTH EVERY 7 (SEVEN) DAYS. TAKE WITH A FULL GLASS OF WATER ON AN EMPTY STOMACH. 12 tablet 3  ? atorvastatin (LIPITOR) 20 MG tablet TAKE 1 TABLET BY MOUTH ONCE DAILY 90 tablet 1  ? Calcium-Vitamin D-Vitamin K 500-500-40 MG-UNT-MCG CHEW 1 po tid 60 tablet   ? cetirizine (ZYRTEC) 10 MG tablet Take 1 tablet (10 mg total) by mouth daily. 100 tablet 2  ? Coenzyme Q10 (COQ10) 30 MG CAPS Take by mouth 2 (two) times daily.    ? levothyroxine (SYNTHROID) 50 MCG tablet Take 1 tablet (50 mcg total) by mouth daily before breakfast. 90 tablet 1  ? Multiple Vitamins-Iron (MULTIVITAMINS WITH IRON) TABS Take 1 tablet by mouth daily.  0  ? Multiple Vitamins-Minerals (ICAPS AREDS 2) CAPS Take 2 capsules by mouth daily.    ? Omega-3 350 MG CAPS Take by mouth.    ?  Besifloxacin HCl (BESIVANCE) 0.6 % SUSP instill one drop into left eye 4 times a day for 2 days after each monthly eye injection 5 mL 12  ? ?No facility-administered medications prior to visit.  ? ? ?No Known Allergies ? ?Review of Systems  ?Constitutional:  Negative for fever.  ?HENT:  Negative for congestion, sinus pain and sore throat.   ?Respiratory:  Negative for cough, shortness of breath and wheezing.   ?Cardiovascular:  Negative for chest pain and palpitations.  ?Gastrointestinal:  Negative for blood in stool, constipation, diarrhea, nausea and vomiting.  ?Genitourinary:  Negative for dysuria, frequency and hematuria.  ?Musculoskeletal:  Negative for joint pain and myalgias.  ?Skin:   ?     (-)New moles  ?Neurological:  Negative for headaches.  ? ?   ?Objective:  ?  ?Physical Exam ?Constitutional:   ?   General: She is not in acute distress. ?   Appearance: Normal appearance. She is not ill-appearing.  ?HENT:  ?   Head: Normocephalic and atraumatic.  ?   Right Ear: Tympanic membrane, ear canal and external ear normal.  ?   Left Ear: Tympanic membrane, ear canal and external ear normal.  ?Eyes:  ?   Extraocular Movements: Extraocular movements intact.  ?   Pupils: Pupils are equal, round, and reactive to light.  ?Cardiovascular:  ?   Rate and Rhythm: Normal rate and regular rhythm.  ?   Heart sounds: Normal heart sounds. No murmur heard. ?  No gallop.  ?Pulmonary:  ?   Effort: Pulmonary effort is normal. No respiratory distress.  ?   Breath sounds: Normal breath sounds. No wheezing or rales.  ?Abdominal:  ?   General: Bowel sounds are normal. There is no distension.  ?   Palpations: Abdomen is soft.  ?   Tenderness: There is no abdominal tenderness. There is no guarding.  ?Skin: ?   General: Skin is warm and dry.  ?Neurological:  ?   Mental Status: She is alert and oriented to person, place, and time.  ?Psychiatric:     ?   Judgment: Judgment normal.  ? ? ?BP 122/72 (BP Location: Left Arm, Patient Position:  Sitting, Cuff Size: Large)   Pulse 67   Temp 97.7 ?F (36.5 ?C) (Oral)   Resp 18   Ht '5\' 4"'$  (1.626 m)   Wt 174 lb 3.2 oz (79 kg)   SpO2 99%   BMI 29.90 kg/m?  ?Wt Readings from Last 3 Encounters:  ?05/03/21 174 lb  3.2 oz (79 kg)  ?12/07/20 174 lb 8 oz (79.2 kg)  ?10/28/20 172 lb 12.8 oz (78.4 kg)  ? ? ?Diabetic Foot Exam - Simple   ?No data filed ?  ? ?Lab Results  ?Component Value Date  ? WBC 4.6 05/03/2021  ? HGB 14.9 05/03/2021  ? HCT 43.0 05/03/2021  ? PLT 247.0 05/03/2021  ? GLUCOSE 100 (H) 05/03/2021  ? CHOL 180 05/03/2021  ? TRIG 123.0 05/03/2021  ? HDL 56.40 05/03/2021  ? LDLDIRECT 149.7 03/31/2009  ? [place] 99 05/03/2021  ? ALT 27 05/03/2021  ? AST 27 05/03/2021  ? NA 139 05/03/2021  ? K 4.9 05/03/2021  ? CL 104 05/03/2021  ? CREATININE 0.67 05/03/2021  ? BUN 10 05/03/2021  ? CO2 28 05/03/2021  ? TSH 1.26 05/03/2021  ? MICROALBUR 0.2 07/15/2012  ? ? ?Lab Results  ?Component Value Date  ? TSH 1.26 05/03/2021  ? ?Lab Results  ?Component Value Date  ? WBC 4.6 05/03/2021  ? HGB 14.9 05/03/2021  ? HCT 43.0 05/03/2021  ? MCV 93.3 05/03/2021  ? PLT 247.0 05/03/2021  ? ?Lab Results  ?Component Value Date  ? NA 139 05/03/2021  ? K 4.9 05/03/2021  ? CO2 28 05/03/2021  ? GLUCOSE 100 (H) 05/03/2021  ? BUN 10 05/03/2021  ? CREATININE 0.67 05/03/2021  ? BILITOT 0.6 05/03/2021  ? ALKPHOS 46 05/03/2021  ? AST 27 05/03/2021  ? ALT 27 05/03/2021  ? PROT 7.0 05/03/2021  ? ALBUMIN 4.7 05/03/2021  ? CALCIUM 9.6 05/03/2021  ? GFR 90.02 05/03/2021  ? ?Lab Results  ?Component Value Date  ? CHOL 180 05/03/2021  ? ?Lab Results  ?Component Value Date  ? HDL 56.40 05/03/2021  ? ?Lab Results  ?Component Value Date  ? [place] 99 05/03/2021  ? ?Lab Results  ?Component Value Date  ? TRIG 123.0 05/03/2021  ? ?Lab Results  ?Component Value Date  ? CHOLHDL 3 05/03/2021  ? ?No results found for: HGBA1C ? ?Mammogram- Last completed 04/28/2020.  ?Dexa- Last completed 04/29/2019. Results showed she is osteoporotic. Repeat in 2 years.   ?Colonoscopy- 01/21/2018. ? ?   ?Assessment & Plan:  ? ?Problem List Items Addressed This Visit   ? ?  ? Unprioritized  ? Preventative health care - Primary  ?  ghm utd ?Check labs  ?See avs ? ?  ?  ? Relevant Orders

## 2021-05-03 NOTE — Assessment & Plan Note (Signed)
Check labs con't synthroid 

## 2021-05-03 NOTE — Assessment & Plan Note (Signed)
con't diet and exercise  

## 2021-05-03 NOTE — Assessment & Plan Note (Signed)
ghm utd Check labs  See avs  

## 2021-05-09 ENCOUNTER — Encounter (HOSPITAL_BASED_OUTPATIENT_CLINIC_OR_DEPARTMENT_OTHER): Payer: Self-pay

## 2021-05-09 ENCOUNTER — Ambulatory Visit (HOSPITAL_BASED_OUTPATIENT_CLINIC_OR_DEPARTMENT_OTHER)
Admission: RE | Admit: 2021-05-09 | Discharge: 2021-05-09 | Disposition: A | Discharge status: Home / Self Care | Payer: No Typology Code available for payment source | Source: Ambulatory Visit | Attending: Family Medicine | Admitting: Family Medicine

## 2021-05-09 DIAGNOSIS — M810 Age-related osteoporosis without current pathological fracture: Secondary | ICD-10-CM

## 2021-05-09 DIAGNOSIS — Z1239 Encounter for other screening for malignant neoplasm of breast: Secondary | ICD-10-CM | POA: Insufficient documentation

## 2021-05-21 ENCOUNTER — Other Ambulatory Visit: Payer: Self-pay | Admitting: Family Medicine

## 2021-05-21 DIAGNOSIS — E785 Hyperlipidemia, unspecified: Secondary | ICD-10-CM

## 2021-05-23 ENCOUNTER — Other Ambulatory Visit: Payer: Self-pay

## 2021-05-23 ENCOUNTER — Other Ambulatory Visit (HOSPITAL_BASED_OUTPATIENT_CLINIC_OR_DEPARTMENT_OTHER): Payer: Self-pay

## 2021-05-23 MED ORDER — SODIUM FLUORIDE 1.1 % DT GEL
DENTAL | 12 refills | Status: DC
  Filled 2021-05-23: qty 100, 30d supply, fill #0
  Filled 2021-08-15: qty 100, 30d supply, fill #1
  Filled 2021-12-19: qty 100, 30d supply, fill #2
  Filled 2022-03-20: qty 100, 21d supply, fill #3
  Filled 2022-04-26: qty 112, 30d supply, fill #4

## 2021-05-23 MED ORDER — ATORVASTATIN CALCIUM 20 MG PO TABS
ORAL_TABLET | Freq: Every day | ORAL | 1 refills | Status: DC
  Filled 2021-05-23: qty 90, 90d supply, fill #0
  Filled 2021-08-15: qty 90, 90d supply, fill #1

## 2021-06-01 ENCOUNTER — Other Ambulatory Visit (HOSPITAL_BASED_OUTPATIENT_CLINIC_OR_DEPARTMENT_OTHER): Payer: Self-pay

## 2021-06-27 ENCOUNTER — Other Ambulatory Visit (HOSPITAL_BASED_OUTPATIENT_CLINIC_OR_DEPARTMENT_OTHER): Payer: Self-pay

## 2021-06-27 ENCOUNTER — Other Ambulatory Visit: Payer: Self-pay | Admitting: Family Medicine

## 2021-06-27 DIAGNOSIS — E038 Other specified hypothyroidism: Secondary | ICD-10-CM

## 2021-06-27 MED ORDER — LEVOTHYROXINE SODIUM 50 MCG PO TABS
50.0000 ug | ORAL_TABLET | Freq: Every day | ORAL | 1 refills | Status: DC
  Filled 2021-06-27: qty 90, 90d supply, fill #0
  Filled 2021-09-22: qty 90, 90d supply, fill #1

## 2021-07-08 ENCOUNTER — Other Ambulatory Visit (HOSPITAL_BASED_OUTPATIENT_CLINIC_OR_DEPARTMENT_OTHER): Payer: Self-pay

## 2021-08-15 ENCOUNTER — Other Ambulatory Visit (HOSPITAL_BASED_OUTPATIENT_CLINIC_OR_DEPARTMENT_OTHER): Payer: Self-pay

## 2021-09-22 ENCOUNTER — Other Ambulatory Visit (HOSPITAL_BASED_OUTPATIENT_CLINIC_OR_DEPARTMENT_OTHER): Payer: Self-pay

## 2021-10-31 ENCOUNTER — Other Ambulatory Visit: Payer: Self-pay | Admitting: Family Medicine

## 2021-10-31 DIAGNOSIS — E785 Hyperlipidemia, unspecified: Secondary | ICD-10-CM

## 2021-11-01 ENCOUNTER — Ambulatory Visit (INDEPENDENT_AMBULATORY_CARE_PROVIDER_SITE_OTHER): Payer: No Typology Code available for payment source | Admitting: Family Medicine

## 2021-11-01 ENCOUNTER — Other Ambulatory Visit (HOSPITAL_BASED_OUTPATIENT_CLINIC_OR_DEPARTMENT_OTHER): Payer: Self-pay

## 2021-11-01 VITALS — BP 124/70 | HR 63 | Temp 98.2°F | Resp 18 | Ht 64.0 in | Wt 174.6 lb

## 2021-11-01 DIAGNOSIS — Z23 Encounter for immunization: Secondary | ICD-10-CM | POA: Diagnosis not present

## 2021-11-01 DIAGNOSIS — M810 Age-related osteoporosis without current pathological fracture: Secondary | ICD-10-CM

## 2021-11-01 DIAGNOSIS — E785 Hyperlipidemia, unspecified: Secondary | ICD-10-CM

## 2021-11-01 DIAGNOSIS — E039 Hypothyroidism, unspecified: Secondary | ICD-10-CM

## 2021-11-01 LAB — COMPREHENSIVE METABOLIC PANEL
ALT: 30 U/L (ref 0–35)
AST: 27 U/L (ref 0–37)
Albumin: 4.7 g/dL (ref 3.5–5.2)
Alkaline Phosphatase: 50 U/L (ref 39–117)
BUN: 12 mg/dL (ref 6–23)
CO2: 29 mEq/L (ref 19–32)
Calcium: 9.8 mg/dL (ref 8.4–10.5)
Chloride: 102 mEq/L (ref 96–112)
Creatinine, Ser: 0.61 mg/dL (ref 0.40–1.20)
GFR: 91.76 mL/min (ref >60.00)
Glucose, Bld: 98 mg/dL (ref 70–99)
Potassium: 4.3 mEq/L (ref 3.5–5.1)
Sodium: 138 mEq/L (ref 135–145)
Total Bilirubin: 0.6 mg/dL (ref 0.2–1.2)
Total Protein: 6.9 g/dL (ref 6.0–8.3)

## 2021-11-01 LAB — LIPID PANEL
Cholesterol: 168 mg/dL (ref 0–200)
HDL: 56.2 mg/dL (ref >39.00)
LDL Cholesterol: 90 mg/dL (ref 0–99)
NonHDL: 112.1
Total CHOL/HDL Ratio: 3
Triglycerides: 110 mg/dL (ref 0.0–149.0)
VLDL: 22 mg/dL (ref 0.0–40.0)

## 2021-11-01 LAB — CBC WITH DIFFERENTIAL/PLATELET
Basophils Absolute: 0 10*3/uL (ref 0.0–0.1)
Basophils Relative: 1 % (ref 0.0–3.0)
Eosinophils Absolute: 0.1 10*3/uL (ref 0.0–0.7)
Eosinophils Relative: 2.3 % (ref 0.0–5.0)
HCT: 43.8 % (ref 36.0–46.0)
Hemoglobin: 14.9 g/dL (ref 12.0–15.0)
Lymphocytes Relative: 28.9 % (ref 12.0–46.0)
Lymphs Abs: 1.4 10*3/uL (ref 0.7–4.0)
MCHC: 34 g/dL (ref 30.0–36.0)
MCV: 94.1 fl (ref 78.0–100.0)
Monocytes Absolute: 0.4 10*3/uL (ref 0.1–1.0)
Monocytes Relative: 7.4 % (ref 3.0–12.0)
Neutro Abs: 3 10*3/uL (ref 1.4–7.7)
Neutrophils Relative %: 60.4 % (ref 43.0–77.0)
Platelets: 238 10*3/uL (ref 150.0–400.0)
RBC: 4.66 Mil/uL (ref 3.87–5.11)
RDW: 12.4 % (ref 11.5–15.5)
WBC: 5 10*3/uL (ref 4.0–10.5)

## 2021-11-01 LAB — VITAMIN D 25 HYDROXY (VIT D DEFICIENCY, FRACTURES): VITD: 78.18 ng/mL (ref 30.00–100.00)

## 2021-11-01 LAB — TSH: TSH: 1.03 u[IU]/mL (ref 0.35–5.50)

## 2021-11-01 MED ORDER — ATORVASTATIN CALCIUM 20 MG PO TABS
20.0000 mg | ORAL_TABLET | Freq: Every day | ORAL | 1 refills | Status: DC
  Filled 2021-11-01: qty 90, 90d supply, fill #0
  Filled 2021-11-01: qty 90, fill #0
  Filled 2021-11-01 – 2021-11-07 (×3): qty 90, 90d supply, fill #0
  Filled 2022-02-02: qty 90, 90d supply, fill #1

## 2021-11-01 MED ORDER — ALENDRONATE SODIUM 70 MG PO TABS
70.0000 mg | ORAL_TABLET | ORAL | 3 refills | Status: DC
  Filled 2021-11-01: qty 12, 84d supply, fill #0
  Filled 2021-11-01: qty 12, fill #0
  Filled 2022-01-24: qty 12, 84d supply, fill #1
  Filled 2022-04-26: qty 12, 84d supply, fill #2
  Filled 2022-07-11: qty 12, 84d supply, fill #3

## 2021-11-01 MED ORDER — CETIRIZINE HCL 10 MG PO TABS
10.0000 mg | ORAL_TABLET | Freq: Every day | ORAL | 2 refills | Status: DC
  Filled 2021-11-01 (×2): qty 100, 100d supply, fill #0
  Filled 2021-12-19: qty 100, 100d supply, fill #1
  Filled 2022-01-24: qty 100, 90d supply, fill #2

## 2021-11-01 NOTE — Patient Instructions (Signed)

## 2021-11-01 NOTE — Progress Notes (Signed)
Subjective:   By signing my name below, I, [name], attest that this documentation has been prepared under the direction and in the presence of [name], DO. 11/01/2021      Patient ID: Rachel Kline, female    DOB: [birth year], 66 y.o.   MRN: [id number]  Chief Complaint  Patient presents with   Hyperlipidemia   Hypothyroidism   Follow-up    Hyperlipidemia Pertinent negatives include no chest pain or shortness of breath.   Patient is in today for a follow up visit.   She is requesting a refill for 70 mg fosamax, 20 mg Lipitor.  She is maintaining a healthy diet to help improve her cholesterol levels.  Lab Results  Component Value Date   CHOL 168 11/01/2021   HDL 56.20 11/01/2021   LDLCALC 90 11/01/2021   LDLDIRECT 149.7 03/31/2009   TRIG 110.0 11/01/2021   CHOLHDL 3 11/01/2021   She is receiving the flu vaccine during this visit. She is UTD on pneumonia vaccines.   Past Medical History:  Diagnosis Date   Contact lens/glasses fitting    wears contacts or glasses   Hyperlipidemia    Macular degeneration 07/02/2012   Primary hypertension 10/24/2019    Past Surgical History:  Procedure Laterality Date   COLONOSCOPY     GANGLION CYST EXCISION  6/99   rt wrist   TONSILLECTOMY N/A 11/22/2012   Procedure: TONSILLECTOMY;  Surgeon: Rozetta Nunnery, MD;  Location: [facility];  Service: ENT;  Laterality: N/A;    Family History  Problem Relation Age of Onset   Pancreatic cancer Mother    Diabetes Mother    Heart failure Father    Kidney failure Father        end stage   Heart disease Father        chf, cabg   Hypertension Father    Hypertension Brother    Hyperlipidemia Brother    Coronary artery disease Other        female 1st degree relative <50    Social History   Socioeconomic History   Marital status: Married    Spouse name: Not on file   Number of children: Not on file   Years of education: Not on file   Highest  education level: Not on file  Occupational History   Occupation: Optician, dispensing: [employer]   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  Tobacco Use   Smoking status: Never   Smokeless tobacco: Never  Substance and Sexual Activity   Alcohol use: Yes    Comment: rare   Drug use: No   Sexual activity: Yes    Partners: Male  Other Topics Concern   Not on file  Social History Narrative   Exercise-- 30 min daily    Social Determinants of Health   Financial Resource Strain: Not on file  Food Insecurity: Not on file  Transportation Needs: Not on file  Physical Activity: Not on file  Stress: Not on file  Social Connections: Not on file  Intimate Partner Violence: Not on file    Outpatient Medications Prior to Visit  Medication Sig Dispense Refill   Calcium-Vitamin D-Vitamin K 500-500-40 MG-UNT-MCG CHEW 1 po tid 60 tablet    cetirizine (ZYRTEC) 10 MG tablet Take 1 tablet (10 mg total) by mouth daily. 100 tablet 2   Coenzyme Q10 (COQ10) 30 MG CAPS Take by mouth 2 (two)  times daily.     levothyroxine (SYNTHROID) 50 MCG tablet Take 1 tablet (50 mcg total) by mouth daily before breakfast. 90 tablet 1   Multiple Vitamins-Iron (MULTIVITAMINS WITH IRON) TABS Take 1 tablet by mouth daily.  0   Multiple Vitamins-Minerals (ICAPS AREDS 2) CAPS Take 2 capsules by mouth daily.     Omega-3 350 MG CAPS Take by mouth.     sodium fluoride (FLUORISHIELD) 1.1 % GEL dental gel Apply a thin ribbon or pea-sized amount to toothbrush and brush teeth for at least 2 minutes, spit excess 100 mL 12   alendronate (FOSAMAX) 70 MG tablet TAKE 1 TABLET (70 MG TOTAL) BY MOUTH EVERY 7 (SEVEN) DAYS. TAKE WITH A FULL GLASS OF WATER ON AN EMPTY STOMACH. 12 tablet 3   atorvastatin (LIPITOR) 20 MG tablet TAKE 1 TABLET BY MOUTH ONCE DAILY 90 tablet 1   No facility-administered medications prior to visit.    No Known Allergies  Review of Systems  Constitutional:   Negative for fever and malaise/fatigue.  HENT:  Negative for congestion.   Eyes:  Negative for blurred vision.  Respiratory:  Negative for shortness of breath.   Cardiovascular:  Negative for chest pain, palpitations and leg swelling.  Gastrointestinal:  Negative for abdominal pain, blood in stool and nausea.  Genitourinary:  Negative for dysuria and frequency.  Musculoskeletal:  Negative for falls.  Skin:  Negative for rash.  Neurological:  Negative for dizziness, loss of consciousness and headaches.  Endo/Heme/Allergies:  Negative for environmental allergies.  Psychiatric/Behavioral:  Negative for depression. The patient is not nervous/anxious.        Objective:    Physical Exam Vitals and nursing note reviewed.  Constitutional:      General: She is not in acute distress.    Appearance: Normal appearance. She is not ill-appearing.  HENT:     Head: Normocephalic and atraumatic.     Right Ear: External ear normal.     Left Ear: External ear normal.  Eyes:     Extraocular Movements: Extraocular movements intact.     Pupils: Pupils are equal, round, and reactive to light.  Cardiovascular:     Rate and Rhythm: Normal rate and regular rhythm.     Heart sounds: Normal heart sounds. No murmur heard.    No gallop.  Pulmonary:     Effort: Pulmonary effort is normal. No respiratory distress.     Breath sounds: Normal breath sounds. No wheezing or rales.  Skin:    General: Skin is warm and dry.  Neurological:     Mental Status: She is alert and oriented to person, place, and time.  Psychiatric:        Judgment: Judgment normal.     BP 124/70 (BP Location: Left Arm, Patient Position: Sitting, Cuff Size: Large)   Pulse 63   Temp 98.2 F (36.8 C) (Oral)   Resp 18   Ht '5\' 4"'$  (1.626 m)   Wt 174 lb 9.6 oz (79.2 kg)   SpO2 96%   BMI 29.97 kg/m  Wt Readings from Last 3 Encounters:  11/01/21 174 lb 9.6 oz (79.2 kg)  05/03/21 174 lb 3.2 oz (79 kg)  12/07/20 174 lb 8 oz (79.2 kg)     Diabetic Foot Exam - Simple   No data filed    Lab Results  Component Value Date   WBC 5.0 11/01/2021   HGB 14.9 11/01/2021   HCT 43.8 11/01/2021   PLT 238.0 11/01/2021   GLUCOSE 98  11/01/2021   CHOL 168 11/01/2021   TRIG 110.0 11/01/2021   HDL 56.20 11/01/2021   LDLDIRECT 149.7 03/31/2009   LDLCALC 90 11/01/2021   ALT 30 11/01/2021   AST 27 11/01/2021   NA 138 11/01/2021   K 4.3 11/01/2021   CL 102 11/01/2021   CREATININE 0.61 11/01/2021   BUN 12 11/01/2021   CO2 29 11/01/2021   TSH 1.03 11/01/2021   MICROALBUR 0.2 07/15/2012    Lab Results  Component Value Date   TSH 1.03 11/01/2021   Lab Results  Component Value Date   WBC 5.0 11/01/2021   HGB 14.9 11/01/2021   HCT 43.8 11/01/2021   MCV 94.1 11/01/2021   PLT 238.0 11/01/2021   Lab Results  Component Value Date   NA 138 11/01/2021   K 4.3 11/01/2021   CO2 29 11/01/2021   GLUCOSE 98 11/01/2021   BUN 12 11/01/2021   CREATININE 0.61 11/01/2021   BILITOT 0.6 11/01/2021   ALKPHOS 50 11/01/2021   AST 27 11/01/2021   ALT 30 11/01/2021   PROT 6.9 11/01/2021   ALBUMIN 4.7 11/01/2021   CALCIUM 9.8 11/01/2021   GFR 91.76 11/01/2021   Lab Results  Component Value Date   CHOL 168 11/01/2021   Lab Results  Component Value Date   HDL 56.20 11/01/2021   Lab Results  Component Value Date   LDLCALC 90 11/01/2021   Lab Results  Component Value Date   TRIG 110.0 11/01/2021   Lab Results  Component Value Date   CHOLHDL 3 11/01/2021   No results found for: "HGBA1C"     Assessment & Plan:   Problem List Items Addressed This Visit       Unprioritized   Hyperlipidemia LDL goal <100   Relevant Medications   atorvastatin (LIPITOR) 20 MG tablet   Other Relevant Orders   Comprehensive metabolic panel (Completed)   Lipid panel (Completed)   CBC with Differential/Platelet (Completed)   TSH (Completed)   VITAMIN D 25 Hydroxy (Vit-D Deficiency, Fractures) (Completed)   Hypothyroidism    Check  labs  con't synthroid       Relevant Orders   TSH (Completed)   Hyperlipidemia    Encourage heart healthy diet such as MIND or DASH diet, increase exercise, avoid trans fats, simple carbohydrates and processed foods, consider a krill or fish or flaxseed oil cap daily.       Relevant Medications   atorvastatin (LIPITOR) 20 MG tablet   Other Relevant Orders   Comprehensive metabolic panel (Completed)   Lipid panel (Completed)   CBC with Differential/Platelet (Completed)   TSH (Completed)   VITAMIN D 25 Hydroxy (Vit-D Deficiency, Fractures) (Completed)   Other Visit Diagnoses     Need for influenza vaccination    -  Primary   Relevant Orders   Flu Vaccine QUAD High Dose(Fluad) (Completed)   Age-related osteoporosis without current pathological fracture       Relevant Medications   alendronate (FOSAMAX) 70 MG tablet   Other Relevant Orders   VITAMIN D 25 Hydroxy (Vit-D Deficiency, Fractures) (Completed)        Meds ordered this encounter  Medications   atorvastatin (LIPITOR) 20 MG tablet    Sig: Take 1 tablet (20 mg total) by mouth daily.    Dispense:  90 tablet    Refill:  1   alendronate (FOSAMAX) 70 MG tablet    Sig: TAKE 1 TABLET (70 MG TOTAL) BY MOUTH EVERY 7 (SEVEN) DAYS. TAKE WITH A  FULL GLASS OF WATER ON AN EMPTY STOMACH.    Dispense:  12 tablet    Refill:  3    I, [name], DO, personally preformed the services described in this documentation.  All medical record entries made by the scribe were at my direction and in my presence.  I have reviewed the chart and discharge instructions (if applicable) and agree that the record reflects my personal performance and is accurate and complete. 11/01/2021   I,[name],acting as a scribe for [name], DO.,have documented all relevant documentation on the behalf of [name], DO,as directed by  [name], DO while in the presence of [name], DO.   [name], DO

## 2021-11-02 ENCOUNTER — Other Ambulatory Visit (HOSPITAL_BASED_OUTPATIENT_CLINIC_OR_DEPARTMENT_OTHER): Payer: Self-pay

## 2021-11-02 NOTE — Assessment & Plan Note (Signed)
Check labs con't synthroid 

## 2021-11-02 NOTE — Assessment & Plan Note (Signed)
Encourage heart healthy diet such as MIND or DASH diet, increase exercise, avoid trans fats, simple carbohydrates and processed foods, consider a krill or fish or flaxseed oil cap daily.  °

## 2021-11-07 ENCOUNTER — Other Ambulatory Visit (HOSPITAL_BASED_OUTPATIENT_CLINIC_OR_DEPARTMENT_OTHER): Payer: Self-pay

## 2021-12-19 ENCOUNTER — Other Ambulatory Visit: Payer: Self-pay | Admitting: Family Medicine

## 2021-12-19 ENCOUNTER — Other Ambulatory Visit (HOSPITAL_BASED_OUTPATIENT_CLINIC_OR_DEPARTMENT_OTHER): Payer: Self-pay

## 2021-12-19 DIAGNOSIS — E038 Other specified hypothyroidism: Secondary | ICD-10-CM

## 2021-12-19 MED ORDER — LEVOTHYROXINE SODIUM 50 MCG PO TABS
50.0000 ug | ORAL_TABLET | Freq: Every day | ORAL | 1 refills | Status: DC
  Filled 2021-12-19: qty 90, 90d supply, fill #0
  Filled 2022-01-24 – 2022-03-20 (×2): qty 90, 90d supply, fill #1

## 2022-01-24 ENCOUNTER — Other Ambulatory Visit (HOSPITAL_BASED_OUTPATIENT_CLINIC_OR_DEPARTMENT_OTHER): Payer: Self-pay

## 2022-01-31 DIAGNOSIS — H353231 Exudative age-related macular degeneration, bilateral, with active choroidal neovascularization: Secondary | ICD-10-CM | POA: Diagnosis not present

## 2022-03-20 ENCOUNTER — Other Ambulatory Visit: Payer: Self-pay

## 2022-03-20 ENCOUNTER — Other Ambulatory Visit (HOSPITAL_BASED_OUTPATIENT_CLINIC_OR_DEPARTMENT_OTHER): Payer: Self-pay

## 2022-04-04 DIAGNOSIS — H524 Presbyopia: Secondary | ICD-10-CM | POA: Diagnosis not present

## 2022-04-04 DIAGNOSIS — H52203 Unspecified astigmatism, bilateral: Secondary | ICD-10-CM | POA: Diagnosis not present

## 2022-04-12 DIAGNOSIS — H353231 Exudative age-related macular degeneration, bilateral, with active choroidal neovascularization: Secondary | ICD-10-CM | POA: Diagnosis not present

## 2022-04-13 ENCOUNTER — Other Ambulatory Visit (HOSPITAL_BASED_OUTPATIENT_CLINIC_OR_DEPARTMENT_OTHER): Payer: Self-pay | Admitting: Family Medicine

## 2022-04-13 DIAGNOSIS — Z1231 Encounter for screening mammogram for malignant neoplasm of breast: Secondary | ICD-10-CM

## 2022-04-26 ENCOUNTER — Other Ambulatory Visit (HOSPITAL_BASED_OUTPATIENT_CLINIC_OR_DEPARTMENT_OTHER): Payer: Self-pay

## 2022-04-26 ENCOUNTER — Other Ambulatory Visit: Payer: Self-pay

## 2022-05-01 ENCOUNTER — Other Ambulatory Visit: Payer: Self-pay | Admitting: Family Medicine

## 2022-05-01 ENCOUNTER — Other Ambulatory Visit (HOSPITAL_BASED_OUTPATIENT_CLINIC_OR_DEPARTMENT_OTHER): Payer: Self-pay

## 2022-05-01 DIAGNOSIS — E785 Hyperlipidemia, unspecified: Secondary | ICD-10-CM

## 2022-05-01 MED ORDER — ATORVASTATIN CALCIUM 20 MG PO TABS
20.0000 mg | ORAL_TABLET | Freq: Every day | ORAL | 0 refills | Status: DC
  Filled 2022-05-01: qty 90, 90d supply, fill #0

## 2022-05-05 ENCOUNTER — Encounter: Payer: Self-pay | Admitting: Family Medicine

## 2022-05-05 ENCOUNTER — Ambulatory Visit (INDEPENDENT_AMBULATORY_CARE_PROVIDER_SITE_OTHER): Payer: 59 | Admitting: Family Medicine

## 2022-05-05 VITALS — BP 130/80 | HR 65 | Temp 97.6°F | Resp 18 | Ht 64.0 in | Wt 175.0 lb

## 2022-05-05 DIAGNOSIS — E785 Hyperlipidemia, unspecified: Secondary | ICD-10-CM

## 2022-05-05 DIAGNOSIS — M810 Age-related osteoporosis without current pathological fracture: Secondary | ICD-10-CM | POA: Diagnosis not present

## 2022-05-05 DIAGNOSIS — H3530 Unspecified macular degeneration: Secondary | ICD-10-CM

## 2022-05-05 DIAGNOSIS — E039 Hypothyroidism, unspecified: Secondary | ICD-10-CM

## 2022-05-05 DIAGNOSIS — Z0000 Encounter for general adult medical examination without abnormal findings: Secondary | ICD-10-CM | POA: Diagnosis not present

## 2022-05-05 LAB — CBC WITH DIFFERENTIAL/PLATELET
Basophils Absolute: 0 10*3/uL (ref 0.0–0.1)
Basophils Relative: 1 % (ref 0.0–3.0)
Eosinophils Absolute: 0.1 10*3/uL (ref 0.0–0.7)
Eosinophils Relative: 2.2 % (ref 0.0–5.0)
HCT: 43.1 % (ref 36.0–46.0)
Hemoglobin: 14.8 g/dL (ref 12.0–15.0)
Lymphocytes Relative: 30.9 % (ref 12.0–46.0)
Lymphs Abs: 1.4 10*3/uL (ref 0.7–4.0)
MCHC: 34.5 g/dL (ref 30.0–36.0)
MCV: 93 fl (ref 78.0–100.0)
Monocytes Absolute: 0.4 10*3/uL (ref 0.1–1.0)
Monocytes Relative: 9.2 % (ref 3.0–12.0)
Neutro Abs: 2.6 10*3/uL (ref 1.4–7.7)
Neutrophils Relative %: 56.7 % (ref 43.0–77.0)
Platelets: 242 10*3/uL (ref 150.0–400.0)
RBC: 4.63 Mil/uL (ref 3.87–5.11)
RDW: 12.4 % (ref 11.5–15.5)
WBC: 4.7 10*3/uL (ref 4.0–10.5)

## 2022-05-05 LAB — TSH: TSH: 0.95 u[IU]/mL (ref 0.35–5.50)

## 2022-05-05 LAB — LIPID PANEL
Cholesterol: 163 mg/dL (ref 0–200)
HDL: 49.3 mg/dL (ref >39.00)
LDL Cholesterol: 89 mg/dL (ref 0–99)
NonHDL: 113.58
Total CHOL/HDL Ratio: 3
Triglycerides: 121 mg/dL (ref 0.0–149.0)
VLDL: 24.2 mg/dL (ref 0.0–40.0)

## 2022-05-05 LAB — VITAMIN D 25 HYDROXY (VIT D DEFICIENCY, FRACTURES): VITD: 60.22 ng/mL (ref 30.00–100.00)

## 2022-05-05 LAB — COMPREHENSIVE METABOLIC PANEL
ALT: 25 U/L (ref 0–35)
AST: 24 U/L (ref 0–37)
Albumin: 4.6 g/dL (ref 3.5–5.2)
Alkaline Phosphatase: 54 U/L (ref 39–117)
BUN: 11 mg/dL (ref 6–23)
CO2: 28 mEq/L (ref 19–32)
Calcium: 9.4 mg/dL (ref 8.4–10.5)
Chloride: 102 mEq/L (ref 96–112)
Creatinine, Ser: 0.66 mg/dL (ref 0.40–1.20)
GFR: 89.71 mL/min (ref >60.00)
Glucose, Bld: 100 mg/dL — ABNORMAL HIGH (ref 70–99)
Potassium: 4.8 mEq/L (ref 3.5–5.1)
Sodium: 138 mEq/L (ref 135–145)
Total Bilirubin: 0.5 mg/dL (ref 0.2–1.2)
Total Protein: 6.7 g/dL (ref 6.0–8.3)

## 2022-05-05 NOTE — Assessment & Plan Note (Signed)
Encourage heart healthy diet such as MIND or DASH diet, increase exercise, avoid trans fats, simple carbohydrates and processed foods, consider a krill or fish or flaxseed oil cap daily.  °

## 2022-05-05 NOTE — Progress Notes (Addendum)
Subjective:   By signing my name below, I, Rachel Kline, attest that this documentation has been prepared under the direction and in the presence of Rachel Schultz, DO 05/05/22   Patient ID: Rachel Kline, female    DOB: 1955/10/08, 67 y.o.   MRN: [id number]  Chief Complaint  Patient presents with   Annual Exam    Pt states fasting     HPI Patient is in today for a comprehensive physical exam.   She is overall doing well and has no new concerns. She stays active by walking for about an hour 4-5 days a week.   Last mammogram: 05/09/2021. Results were normal. Next scheduled on 05/18/2022.   Last colonoscopy: 11/02/2017. Polyps found and removed. Otherwise normal.   Last bone density: 05/09/2021. Osteoporosis.   She is UTD on shingles vaccine.   She is UTD on routine vision care. She is now following up with Dr. [name] for her macular degeneration.   She is UTD on routine dental care.   She has been retired since  2019.   Past Medical History:  Diagnosis Date   Contact lens/glasses fitting    wears contacts or glasses   Hyperlipidemia    Macular degeneration 07/02/2012   Primary hypertension 10/24/2019    Past Surgical History:  Procedure Laterality Date   COLONOSCOPY     GANGLION CYST EXCISION  6/99   rt wrist   TONSILLECTOMY N/A 11/22/2012   Procedure: TONSILLECTOMY;  Surgeon: [name], MD;  Location: [facility];  Service: ENT;  Laterality: N/A;    Family History  Problem Relation Age of Onset   Pancreatic cancer Mother    Diabetes Mother    Heart failure Father    Kidney failure Father        end stage   Heart disease Father        chf, cabg   Hypertension Father    Hypertension Brother    Hyperlipidemia Brother    Coronary artery disease Other        female 1st degree relative <50    Social History   Socioeconomic History   Marital status: Married    Spouse name: Not on file   Number of children: Not on file    Years of education: Not on file   Highest education level: Not on file  Occupational History   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  Tobacco Use   Smoking status: Never   Smokeless tobacco: Never  Substance and Sexual Activity   Alcohol use: Yes    Comment: rare   Drug use: No   Sexual activity: Yes    Partners: Male  Other Topics Concern   Not on file  Social History Narrative   Exercise-- 60 min daily  4-5 days a week    Social Determinants of Health   Financial Resource Strain: Not on file  Food Insecurity: Not on file  Transportation Needs: Not on file  Physical Activity: Not on file  Stress: Not on file  Social Connections: Not on file  Intimate Partner Violence: Not on file    Outpatient Medications Prior to Visit  Medication Sig Dispense Refill   alendronate (FOSAMAX) 70 MG tablet TAKE 1 TABLET (70 MG TOTAL) BY MOUTH EVERY 7 (SEVEN) DAYS. TAKE WITH A FULL GLASS OF WATER ON  AN EMPTY STOMACH. 12 tablet 3   atorvastatin (LIPITOR) 20 MG tablet Take 1 tablet (20 mg total) by mouth daily. 90 tablet 0   Calcium-Vitamin D-Vitamin K 500-500-40 MG-UNT-MCG CHEW 1 po tid 60 tablet    cetirizine (ZYRTEC) 10 MG tablet Take 1 tablet (10 mg total) by mouth daily. 100 tablet 2   Coenzyme Q10 (COQ10) 30 MG CAPS Take by mouth 2 (two) times daily.     levothyroxine (SYNTHROID) 50 MCG tablet Take 1 tablet (50 mcg total) by mouth daily before breakfast. 90 tablet 1   Multiple Vitamins-Iron (MULTIVITAMINS WITH IRON) TABS Take 1 tablet by mouth daily.  0   Multiple Vitamins-Minerals (ICAPS AREDS 2) CAPS Take 2 capsules by mouth daily.     Omega-3 350 MG CAPS Take by mouth.     sodium fluoride (FLUORISHIELD) 1.1 % GEL dental gel Apply a thin ribbon or pea-sized amount to toothbrush and brush teeth for at least 2 minutes, spit excess 100 mL 12   No facility-administered  medications prior to visit.    No Known Allergies  Review of Systems  Constitutional:  Negative for fever and malaise/fatigue.  HENT:  Negative for congestion.   Eyes:  Negative for blurred vision.  Respiratory:  Negative for shortness of breath.   Cardiovascular:  Negative for chest pain, palpitations and leg swelling.  Gastrointestinal:  Negative for abdominal pain, blood in stool and nausea.  Genitourinary:  Negative for dysuria and frequency.  Musculoskeletal:  Negative for falls.  Skin:  Negative for rash.  Neurological:  Negative for dizziness, loss of consciousness and headaches.  Endo/Heme/Allergies:  Negative for environmental allergies.  Psychiatric/Behavioral:  Negative for depression. The patient is not nervous/anxious.        Objective:    Physical Exam Vitals and nursing note reviewed.  Constitutional:      General: She is not in acute distress.    Appearance: She is well-developed.  HENT:     Right Ear: External ear normal.     Left Ear: External ear normal.     Nose: Nose normal.  Eyes:     Pupils: Pupils are equal, round, and reactive to light.  Cardiovascular:     Rate and Rhythm: Normal rate and regular rhythm.     Heart sounds: Normal heart sounds. No murmur heard. Pulmonary:     Effort: Pulmonary effort is normal. No respiratory distress.     Breath sounds: Normal breath sounds. No wheezing or rales.  Chest:     Chest wall: No tenderness.  Musculoskeletal:        General: Normal range of motion.     Cervical back: Normal range of motion and neck supple.  Neurological:     General: No focal deficit present.     Mental Status: She is alert and oriented to person, place, and time.  Psychiatric:        Behavior: Behavior normal.        Thought Content: Thought content normal.        Judgment: Judgment normal.     BP 130/80 (BP Location: Left Arm, Patient Position: Sitting, Cuff Size: Normal)   Pulse 65   Temp 97.6 F (36.4 C) (Oral)   Resp  18   Ht 5\' 4"  (1.626 m)   Wt 175 lb (79.4 kg)   SpO2 98%   BMI 30.04 kg/m  Wt Readings from Last 3 Encounters:  05/05/22 175 lb (79.4 kg)  11/01/21 174 lb 9.6 oz (79.2  kg)  05/03/21 174 lb 3.2 oz (79 kg)       Assessment & Plan:  Preventative health care Assessment & Plan: Ghm utd Check labs  See AVS Health Maintenance  Topic Date Due   COVID-19 Vaccine (4 - 2023-24 season) 09/16/2021   INFLUENZA VACCINE  08/17/2022   COLONOSCOPY (Pts 45-70yrs Insurance coverage will need to be confirmed)  11/03/2022   MAMMOGRAM  05/10/2023   DEXA SCAN  05/10/2023   DTaP/Tdap/Td (3 - Td or Tdap) 05/16/2026   Pneumonia Vaccine 29+ Years old  Completed   Hepatitis C Screening  Completed   Zoster Vaccines- Shingrix  Completed   HPV VACCINES  Aged Out     Orders: -     CBC with Differential/Platelet  Hyperlipidemia LDL goal <100 -     CBC with Differential/Platelet  Age-related osteoporosis without current pathological fracture -     VITAMIN D 25 Hydroxy (Vit-D Deficiency, Fractures)  Hypothyroidism, unspecified type Assessment & Plan: Check labs  Con't synthroid   Orders: -     TSH  Hyperlipidemia, unspecified hyperlipidemia type Assessment & Plan: Encourage heart healthy diet such as MIND or DASH diet, increase exercise, avoid trans fats, simple carbohydrates and processed foods, consider a krill or fish or flaxseed oil cap daily.    Orders: -     CBC with Differential/Platelet -     Comprehensive metabolic panel -     Lipid panel  Macular degeneration of both eyes, unspecified type  Morbid obesity -     CBC with Differential/Platelet -     VITAMIN D 25 Hydroxy (Vit-D Deficiency, Fractures)     I,Rachel Rivera,acting as a scribe for Rachel Schultz, DO.,have documented all relevant documentation on the behalf of Rachel Schultz, DO,as directed by  Rachel Schultz, DO while in the presence of Rachel Schultz, DO.   I, Rachel Schultz, DO,  personally preformed the services described in this documentation.  All medical record entries made by the scribe were at my direction and in my presence.  I have reviewed the chart and discharge instructions (if applicable) and agree that the record reflects my personal performance and is accurate and complete. 05/05/22   Rachel Schultz, DO

## 2022-05-05 NOTE — Assessment & Plan Note (Signed)
Check labs Cont synthroid 

## 2022-05-05 NOTE — Assessment & Plan Note (Signed)
Ghm utd Check labs  See AVS Health Maintenance  Topic Date Due   COVID-19 Vaccine (4 - 2023-24 season) 09/16/2021   INFLUENZA VACCINE  08/17/2022   COLONOSCOPY (Pts 45-49yrs Insurance coverage will need to be confirmed)  11/03/2022   MAMMOGRAM  05/10/2023   DEXA SCAN  05/10/2023   DTaP/Tdap/Td (3 - Td or Tdap) 05/16/2026   Pneumonia Vaccine 14+ Years old  Completed   Hepatitis C Screening  Completed   Zoster Vaccines- Shingrix  Completed   HPV VACCINES  Aged Out

## 2022-05-18 ENCOUNTER — Ambulatory Visit (HOSPITAL_BASED_OUTPATIENT_CLINIC_OR_DEPARTMENT_OTHER)
Admission: RE | Admit: 2022-05-18 | Discharge: 2022-05-18 | Disposition: A | Discharge status: Home / Self Care | Payer: 59 | Source: Ambulatory Visit | Attending: Family Medicine | Admitting: Family Medicine

## 2022-05-18 ENCOUNTER — Encounter (HOSPITAL_BASED_OUTPATIENT_CLINIC_OR_DEPARTMENT_OTHER): Payer: Self-pay

## 2022-05-18 DIAGNOSIS — Z1231 Encounter for screening mammogram for malignant neoplasm of breast: Secondary | ICD-10-CM | POA: Insufficient documentation

## 2022-06-07 DIAGNOSIS — H353231 Exudative age-related macular degeneration, bilateral, with active choroidal neovascularization: Secondary | ICD-10-CM | POA: Diagnosis not present

## 2022-06-13 ENCOUNTER — Other Ambulatory Visit (HOSPITAL_BASED_OUTPATIENT_CLINIC_OR_DEPARTMENT_OTHER): Payer: Self-pay

## 2022-06-13 ENCOUNTER — Other Ambulatory Visit: Payer: Self-pay | Admitting: Family Medicine

## 2022-06-13 DIAGNOSIS — E038 Other specified hypothyroidism: Secondary | ICD-10-CM

## 2022-06-13 MED ORDER — CETIRIZINE HCL 10 MG PO TABS
10.0000 mg | ORAL_TABLET | Freq: Every day | ORAL | 2 refills | Status: DC
  Filled 2022-06-13: qty 100, 90d supply, fill #0
  Filled 2022-09-12: qty 100, 90d supply, fill #1
  Filled 2022-12-04: qty 100, 90d supply, fill #2

## 2022-06-13 MED ORDER — LEVOTHYROXINE SODIUM 50 MCG PO TABS
50.0000 ug | ORAL_TABLET | Freq: Every day | ORAL | 1 refills | Status: DC
  Filled 2022-06-13: qty 90, 90d supply, fill #0
  Filled 2022-09-12: qty 90, 90d supply, fill #1

## 2022-07-19 DIAGNOSIS — H353231 Exudative age-related macular degeneration, bilateral, with active choroidal neovascularization: Secondary | ICD-10-CM | POA: Diagnosis not present

## 2022-08-04 ENCOUNTER — Other Ambulatory Visit: Payer: Self-pay | Admitting: Family Medicine

## 2022-08-04 ENCOUNTER — Other Ambulatory Visit (HOSPITAL_BASED_OUTPATIENT_CLINIC_OR_DEPARTMENT_OTHER): Payer: Self-pay

## 2022-08-04 DIAGNOSIS — E785 Hyperlipidemia, unspecified: Secondary | ICD-10-CM

## 2022-08-04 MED ORDER — ATORVASTATIN CALCIUM 20 MG PO TABS
20.0000 mg | ORAL_TABLET | Freq: Every day | ORAL | 3 refills | Status: DC
Start: 2022-08-04 — End: 2023-08-12
  Filled 2022-08-04: qty 90, 90d supply, fill #0
  Filled 2022-11-06 – 2022-11-07 (×2): qty 90, 90d supply, fill #1
  Filled 2023-02-17: qty 90, 90d supply, fill #2
  Filled 2023-05-16: qty 90, 90d supply, fill #3

## 2022-09-12 ENCOUNTER — Other Ambulatory Visit (HOSPITAL_BASED_OUTPATIENT_CLINIC_OR_DEPARTMENT_OTHER): Payer: Self-pay

## 2022-09-12 ENCOUNTER — Other Ambulatory Visit: Payer: Self-pay

## 2022-09-14 ENCOUNTER — Other Ambulatory Visit (HOSPITAL_BASED_OUTPATIENT_CLINIC_OR_DEPARTMENT_OTHER): Payer: Self-pay

## 2022-09-14 MED ORDER — SODIUM FLUORIDE 1.1 % DT GEL
1.0000 | DENTAL | 11 refills | Status: DC
  Filled 2022-09-14: qty 112, 30d supply, fill #0

## 2022-09-18 ENCOUNTER — Telehealth: Payer: 59 | Admitting: Nurse Practitioner

## 2022-09-18 DIAGNOSIS — L309 Dermatitis, unspecified: Secondary | ICD-10-CM | POA: Diagnosis not present

## 2022-09-18 MED ORDER — PREDNISONE 10 MG PO TABS
ORAL_TABLET | ORAL | 0 refills | Status: DC
Start: 2022-09-18 — End: 2022-11-07

## 2022-09-18 NOTE — Progress Notes (Signed)
E Visit for Rash  We are sorry that you are not feeling well. Here is how we plan to help!  Based on what you shared with me it looks like you have contact dermatitis.  Contact dermatitis is a skin rash caused by something that touches the skin and causes irritation or inflammation.  Your skin may be red, swollen, dry, cracked, and itch.  The rash should go away in a few days but can last a few weeks.  If you get a rash, it's important to figure out what caused it so the irritant can be avoided in the future. and I am prescribing a two week course of steroids (37 tablets of 10 mg prednisone).  Days 1-4 take 4 tablets (40 mg) daily  Days 5-8 take 3 tablets (30 mg) daily, Days 9-11 take 2 tablets (20 mg) daily, Days 12-14 take 1 tablet (10 mg) daily.      HOME CARE:  Take cool showers and avoid direct sunlight. Apply cool compress or wet dressings. Take a bath in an oatmeal bath.  Sprinkle content of one Aveeno packet under running faucet with comfortably warm water.  Bathe for 15-20 minutes, 1-2 times daily.  Pat dry with a towel. Do not rub the rash. Use hydrocortisone cream. Take an antihistamine like Benadryl for widespread rashes that itch.  The adult dose of Benadryl is 25-50 mg by mouth 4 times daily. Caution:  This type of medication may cause sleepiness.  Do not drink alcohol, drive, or operate dangerous machinery while taking antihistamines.  Do not take these medications if you have prostate enlargement.  Read package instructions thoroughly on all medications that you take.  GET HELP RIGHT AWAY IF:  Symptoms don't go away after treatment. Severe itching that persists. If you rash spreads or swells. If you rash begins to smell. If it blisters and opens or develops a yellow-brown crust. You develop a fever. You have a sore throat. You become short of breath.  MAKE SURE YOU:  Understand these instructions. Will watch your condition. Will get help right away if you are not  doing well or get worse.  Thank you for choosing an e-visit.  Your e-visit answers were reviewed by a board certified advanced clinical practitioner to complete your personal care plan. Depending upon the condition, your plan could have included both over the counter or prescription medications.  Please review your pharmacy choice. Make sure the pharmacy is open so you can pick up prescription now. If there is a problem, you may contact your provider through MyChart messaging and have the prescription routed to another pharmacy.  Your safety is important to us. If you have drug allergies check your prescription carefully.   For the next 24 hours you can use MyChart to ask questions about today's visit, request a non-urgent call back, or ask for a work or school excuse. You will get an email in the next two days asking about your experience. I hope that your e-visit has been valuable and will speed your recovery.   Meds ordered this encounter  Medications   predniSONE (DELTASONE) 10 MG tablet    Sig: Take 4 tablets (40mg) on days 1-4, then 3 tablets (30mg) on days 5-8, then 2 tablets (20mg) on days 9-11, then 1 tablet daily for days 12-14. Take with food.    Dispense:  37 tablet    Refill:  0    I spent approximately 5 minutes reviewing the patient's history, current symptoms and   coordinating their care today.   

## 2022-09-20 DIAGNOSIS — H353231 Exudative age-related macular degeneration, bilateral, with active choroidal neovascularization: Secondary | ICD-10-CM | POA: Diagnosis not present

## 2022-10-08 ENCOUNTER — Other Ambulatory Visit: Payer: Self-pay | Admitting: Family Medicine

## 2022-10-08 DIAGNOSIS — M810 Age-related osteoporosis without current pathological fracture: Secondary | ICD-10-CM

## 2022-10-09 ENCOUNTER — Other Ambulatory Visit (HOSPITAL_BASED_OUTPATIENT_CLINIC_OR_DEPARTMENT_OTHER): Payer: Self-pay

## 2022-10-09 MED ORDER — ALENDRONATE SODIUM 70 MG PO TABS
70.0000 mg | ORAL_TABLET | ORAL | 3 refills | Status: DC
Start: 2022-10-09 — End: 2023-09-10
  Filled 2022-10-09: qty 12, 84d supply, fill #0
  Filled 2022-12-25: qty 12, 84d supply, fill #1
  Filled 2023-03-26: qty 12, 84d supply, fill #2
  Filled 2023-06-15: qty 12, 84d supply, fill #3

## 2022-10-22 ENCOUNTER — Telehealth: Payer: 59 | Admitting: Physician Assistant

## 2022-10-22 DIAGNOSIS — L304 Erythema intertrigo: Secondary | ICD-10-CM | POA: Diagnosis not present

## 2022-10-22 MED ORDER — NYSTATIN 100000 UNIT/GM EX CREA
1.0000 | TOPICAL_CREAM | Freq: Two times a day (BID) | CUTANEOUS | 0 refills | Status: DC
Start: 2022-10-22 — End: 2023-05-08

## 2022-10-22 NOTE — Progress Notes (Signed)
E Visit for Rash  We are sorry that you are not feeling well. Here is how we plan to help!  Based upon your presentation it appears you have a fungal infection.  I have prescribed: and Nystatin cream apply to the affected area twice daily   HOME CARE:  Take cool showers and avoid direct sunlight. Apply cool compress or wet dressings. Take a bath in an oatmeal bath.  Sprinkle content of one Aveeno packet under running faucet with comfortably warm water.  Bathe for 15-20 minutes, 1-2 times daily.  Pat dry with a towel. Do not rub the rash. Use hydrocortisone cream. Take an antihistamine like Benadryl for widespread rashes that itch.  The adult dose of Benadryl is 25-50 mg by mouth 4 times daily. Caution:  This type of medication may cause sleepiness.  Do not drink alcohol, drive, or operate dangerous machinery while taking antihistamines.  Do not take these medications if you have prostate enlargement.  Read package instructions thoroughly on all medications that you take.  GET HELP RIGHT AWAY IF:  Symptoms don't go away after treatment. Severe itching that persists. If you rash spreads or swells. If you rash begins to smell. If it blisters and opens or develops a yellow-brown crust. You develop a fever. You have a sore throat. You become short of breath.  MAKE SURE YOU:  Understand these instructions. Will watch your condition. Will get help right away if you are not doing well or get worse.  Thank you for choosing an e-visit.  Your e-visit answers were reviewed by a board certified advanced clinical practitioner to complete your personal care plan. Depending upon the condition, your plan could have included both over the counter or prescription medications.  Please review your pharmacy choice. Make sure the pharmacy is open so you can pick up prescription now. If there is a problem, you may contact your provider through MyChart messaging and have the prescription routed to  another pharmacy.  Your safety is important to us. If you have drug allergies check your prescription carefully.   For the next 24 hours you can use MyChart to ask questions about today's visit, request a non-urgent call back, or ask for a work or school excuse. You will get an email in the next two days asking about your experience. I hope that your e-visit has been valuable and will speed your recovery.  I have spent 5 minutes in review of e-visit questionnaire, review and updating patient chart, medical decision making and response to patient.   Sherrol Vicars M Tamarick Kovalcik, PA-C  

## 2022-10-23 ENCOUNTER — Telehealth: Payer: Self-pay | Admitting: Family Medicine

## 2022-10-23 ENCOUNTER — Ambulatory Visit
Admission: RE | Admit: 2022-10-23 | Discharge: 2022-10-23 | Disposition: A | Discharge status: Home / Self Care | Payer: 59 | Source: Ambulatory Visit | Attending: Internal Medicine | Admitting: Internal Medicine

## 2022-10-23 VITALS — BP 161/90 | HR 79 | Temp 97.9°F | Resp 20

## 2022-10-23 DIAGNOSIS — U071 COVID-19: Secondary | ICD-10-CM | POA: Diagnosis not present

## 2022-10-23 DIAGNOSIS — T2124XA Burn of second degree of lower back, initial encounter: Secondary | ICD-10-CM

## 2022-10-23 DIAGNOSIS — B379 Candidiasis, unspecified: Secondary | ICD-10-CM

## 2022-10-23 DIAGNOSIS — S21202A Unspecified open wound of left back wall of thorax without penetration into thoracic cavity, initial encounter: Secondary | ICD-10-CM

## 2022-10-23 MED ORDER — DOXYCYCLINE HYCLATE 100 MG PO CAPS: 100.0000 mg | ORAL_CAPSULE | Freq: Two times a day (BID) | ORAL | 0 refills | Status: DC

## 2022-10-23 MED ORDER — FLUCONAZOLE 150 MG PO TABS: 150.0000 mg | ORAL_TABLET | ORAL | 0 refills | Status: DC

## 2022-10-23 NOTE — Telephone Encounter (Signed)
Initial Comment Caller states that she was bitten by a spider 2 weeks ago. She wants to schedule an appointment. She is light headed, short of breath, and she is weak. The office transfered her to speak to the nurse because they do not have any appointments available today. Translation No Nurse Assessment Nurse: Andrey Campanile, RN, Marylene Land Date/Time Lamount Cohen Time): 10/23/2022 11:58:12 AM Confirm and document reason for call. If symptomatic, describe symptoms. ---Caller states that she was bitten by a spider 2 weeks ago on lower back. No drainage. She is light headed, short of breath, and she is weak. Symptoms started yesterday. Does the patient have any new or worsening symptoms? ---Yes Will a triage be completed? ---Yes Related visit to physician within the last 2 weeks? ---No Does the PT have any chronic conditions? (i.e. diabetes, asthma, this includes High risk factors for pregnancy, etc.) ---Yes List chronic conditions. ---Osteoporosis, Hypothyroidism, High Cholesterol Is this a behavioral health or substance abuse call? ---No Guidelines Guideline Title Affirmed Question Affirmed Notes Nurse Date/Time Lamount Cohen Time) Spider Bite - [place] Difficulty breathing or swallowing Andrey Campanile, RN, Marylene Land 10/23/2022 12:01:30 PM PLEASE NOTE: All timestamps contained within this report are represented as [place] Standard Time. CONFIDENTIALTY NOTICE: This fax transmission is intended only for the addressee. It contains information that is legally privileged, confidential or otherwise protected from use or disclosure. If you are not the intended recipient, you are strictly prohibited from reviewing, disclosing, copying using or disseminating any of this information or taking any action in reliance on or regarding this information. If you have received this fax in error, please notify us immediately by telephone so that we can arrange for its return to [place]. Phone: [phone], Toll-Free:  [phone], Fax: [phone] Page: 2 of 2 Call Id: [id number] Disp. Time Lamount Cohen Time) Disposition Final User 10/23/2022 11:55:08 AM Send to Urgent Queue Brooke Pace 10/23/2022 12:04:59 PM Call EMS 911 Now Yes Andrey Campanile, RN, Marylene Land 10/23/2022 12:05:26 PM 911 Outcome Documentation Andrey Campanile, RN, Marylene Land Reason: Refused. Final Disposition 10/23/2022 12:04:59 PM Call EMS 911 Now Yes Andrey Campanile, RN, Rosalyn Charters Disagree/Comply Disagree Caller Understands Yes PreDisposition Did not know what to do Care Advice Given Per Guideline CALL EMS 911 NOW: * Immediate medical attention is needed. You need to hang up and call 911 (or an ambulance). Comments User: Donney Dice, RN Date/Time Lamount Cohen Time): 10/23/2022 12:01:10 PM Area on back is one inch by half an inch. Black center with bright red ring around it. Referrals GO TO FACILITY REFUSED

## 2022-10-23 NOTE — Telephone Encounter (Signed)
Noted. Wait for triage note

## 2022-10-23 NOTE — Discharge Instructions (Addendum)
Continue your same wound care. You're doing a great job with it. Let's use doxycycline as a good antibiotic to help with your skin infection, prevention. Use oral fluconazole to help with the yeast infection of your skin as the antibiotic can make it worse.   If you develop worsening shortness of breath, chest pain, a fast or racing heart beat then come back to the clinic or to the ER if your symptoms are severe.

## 2022-10-23 NOTE — Telephone Encounter (Signed)
Patient said she thinks she was bitten by a spider a couple of weeks ago but starting yesterday she started feeling lightheaded, short of breath and weak. Transferred to triage

## 2022-10-23 NOTE — ED Provider Notes (Signed)
[facility]  Note:  This document was prepared using Conservation officer, historic buildings and may include unintentional dictation errors.  MRN: [id number] DOB: 11-10-1955  Subjective:   Rachel Kline is a 67 y.o. female presenting for multiple concerns.  Reports 2-week history of persistent red wound over the back.  Symptoms started after she used a heating pad to the area for over an hour.  Patient laid on it and when she took it off, her husband noticed that she had blistered over and was very red.  Patient had some pain to the area.  She thought it was from a spider bite.  Over the next few days, there was weeping and sloughing of the skin.  Has not had fever or drainage of pus, bleeding in the past few days.  Has been tending to the wound very carefully at home. Reports 2-week history of persistent itchy red spots over the underside of her breast, armpit area.  She is currently getting treated for this with nystatin cream but wanted to be evaluated.  Would like to know if this is related to the rash on her back.  Per patient, they do feel different. Reports 2-week history of persistent malaise, fatigue, shortness of breath.  Patient tested positive for COVID-19 10/10/2022 using an at-home test.  Overall she is improving but still continues to have fatigue, shortness of breath.  No history of asthma.  No cough, hemoptysis, chest pain.  No smoking of any kind including cigarettes, cigars, vaping, marijuana use.    No current facility-administered medications for this encounter.  Current Outpatient Medications:    alendronate (FOSAMAX) 70 MG tablet, Take 1 tablet (70 mg total) by mouth once a week., Disp: 12 tablet, Rfl: 3   atorvastatin (LIPITOR) 20 MG tablet, Take 1 tablet (20 mg total) by mouth daily., Disp: 90 tablet, Rfl: 3   Calcium-Vitamin D-Vitamin K 500-500-40 MG-UNT-MCG CHEW, 1 po tid, Disp: 60 tablet, Rfl:    cetirizine (ZYRTEC) 10 MG tablet, Take 1 tablet  (10 mg total) by mouth daily., Disp: 100 tablet, Rfl: 2   Coenzyme Q10 (COQ10) 30 MG CAPS, Take by mouth 2 (two) times daily., Disp: , Rfl:    levothyroxine (SYNTHROID) 50 MCG tablet, Take 1 tablet (50 mcg total) by mouth daily before breakfast., Disp: 90 tablet, Rfl: 1   Multiple Vitamins-Iron (MULTIVITAMINS WITH IRON) TABS, Take 1 tablet by mouth daily., Disp: , Rfl: 0   Multiple Vitamins-Minerals (ICAPS AREDS 2) CAPS, Take 2 capsules by mouth daily., Disp: , Rfl:    nystatin cream (MYCOSTATIN), Apply 1 Application topically 2 (two) times daily., Disp: 30 g, Rfl: 0   Omega-3 350 MG CAPS, Take by mouth., Disp: , Rfl:    predniSONE (DELTASONE) 10 MG tablet, Take 4 tablets (40mg ) on days 1-4, then 3 tablets (30mg ) on days 5-8, then 2 tablets (20mg ) on days 9-11, then 1 tablet daily for days 12-14. Take with food., Disp: 37 tablet, Rfl: 0   sodium fluoride (FLUORISHIELD) 1.1 % GEL dental gel, Apply a thin ribbon or pea-sized amount to toothbrush and brush teeth for at least 2 minutes, spit excess, Disp: 100 mL, Rfl: 12   sodium fluoride (FLUORISHIELD) 1.1 % GEL dental gel, Apply a thin ribbon or pea-sized amount to toothbrush and brush teeth for at least 2 minutes. Spit excess., Disp: 112 g, Rfl: 11   No Known Allergies  Past Medical History:  Diagnosis Date   Contact lens/glasses fitting  wears contacts or glasses   Hyperlipidemia    Macular degeneration 07/02/2012   Primary hypertension 10/24/2019     Past Surgical History:  Procedure Laterality Date   COLONOSCOPY     GANGLION CYST EXCISION  6/99   rt wrist   TONSILLECTOMY N/A 11/22/2012   Procedure: TONSILLECTOMY;  Surgeon: [name], MD;  Location: [facility];  Service: ENT;  Laterality: N/A;    Family History  Problem Relation Age of Onset   Pancreatic cancer Mother    Diabetes Mother    Heart failure Father    Kidney failure Father        end stage   Heart disease Father        chf, cabg    Hypertension Father    Hypertension Brother    Hyperlipidemia Brother    Coronary artery disease Other        female 1st degree relative <50    Social History   Tobacco Use   Smoking status: Never   Smokeless tobacco: Never  Vaping Use   Vaping status: Never Used  Substance Use Topics   Alcohol use: Yes    Comment: rare   Drug use: No    ROS   Objective:   Vitals: BP (!) 161/90 (BP Location: Right Arm)   Pulse 79   Temp 97.9 F (36.6 C) (Oral)   Resp 20   SpO2 95%   Physical Exam Constitutional:      General: She is not in acute distress.    Appearance: Normal appearance. She is well-developed. She is not ill-appearing, toxic-appearing or diaphoretic.  HENT:     Head: Normocephalic and atraumatic.     Nose: Nose normal.     Mouth/Throat:     Mouth: Mucous membranes are moist.  Eyes:     General: No scleral icterus.       Right eye: No discharge.        Left eye: No discharge.     Extraocular Movements: Extraocular movements intact.  Cardiovascular:     Rate and Rhythm: Normal rate and regular rhythm.     Heart sounds: Normal heart sounds. No murmur heard.    No friction rub. No gallop.  Pulmonary:     Effort: Pulmonary effort is normal. No respiratory distress.     Breath sounds: No stridor. No wheezing, rhonchi or rales.  Chest:     Chest wall: No tenderness.    Skin:    General: Skin is warm and dry.     Findings: Rash present.       Neurological:     General: No focal deficit present.     Mental Status: She is alert and oriented to person, place, and time.  Psychiatric:        Mood and Affect: Mood normal.        Behavior: Behavior normal.     Assessment and Plan :   PDMP not reviewed this encounter.  1. Partial thickness burn of lower back, initial encounter   2. Open wound of left side of back, initial encounter   3. Candidiasis    Offered doxycycline to help with secondary wound infection.  Recommended oral fluconazole given her  current candidiasis infection, can continue nystatin cream.  Patient has a clear cardiopulmonary exam and hemodynamically stable vital signs.  However, I offered a chest x-ray to evaluate for secondary pneumonia following her COVID-19 infection.  Patient declined.  Will continue with supportive care.  Counseled patient on potential for adverse effects with medications prescribed/recommended today, ER and return-to-clinic precautions discussed, patient verbalized understanding.    Wallis Bamberg, PA-C 10/23/22 1504

## 2022-10-23 NOTE — Telephone Encounter (Signed)
Pt seen at ED 

## 2022-10-23 NOTE — ED Triage Notes (Addendum)
Pt c/o weakness, light headed and SHOB started yesterday-states she feels she suffered a spider bite to left flank area ~2 weeks ago-states she also has had covid and scattered "yeast rash" in the last the 2 weeks-NAD-steady gait-later added that her PCP called in rx nystatin yesterday for rash

## 2022-10-25 ENCOUNTER — Encounter: Payer: Self-pay | Admitting: Family Medicine

## 2022-11-07 ENCOUNTER — Other Ambulatory Visit (HOSPITAL_BASED_OUTPATIENT_CLINIC_OR_DEPARTMENT_OTHER): Payer: Self-pay

## 2022-11-07 ENCOUNTER — Ambulatory Visit (INDEPENDENT_AMBULATORY_CARE_PROVIDER_SITE_OTHER): Payer: 59 | Admitting: Family Medicine

## 2022-11-07 ENCOUNTER — Encounter: Payer: Self-pay | Admitting: Family Medicine

## 2022-11-07 VITALS — BP 140/82 | HR 72 | Temp 98.1°F | Resp 16 | Ht 64.0 in | Wt 174.8 lb

## 2022-11-07 DIAGNOSIS — E785 Hyperlipidemia, unspecified: Secondary | ICD-10-CM | POA: Diagnosis not present

## 2022-11-07 DIAGNOSIS — T300 Burn of unspecified body region, unspecified degree: Secondary | ICD-10-CM | POA: Diagnosis not present

## 2022-11-07 DIAGNOSIS — E039 Hypothyroidism, unspecified: Secondary | ICD-10-CM

## 2022-11-07 DIAGNOSIS — M810 Age-related osteoporosis without current pathological fracture: Secondary | ICD-10-CM | POA: Diagnosis not present

## 2022-11-07 DIAGNOSIS — Z23 Encounter for immunization: Secondary | ICD-10-CM

## 2022-11-07 DIAGNOSIS — B354 Tinea corporis: Secondary | ICD-10-CM

## 2022-11-07 LAB — LIPID PANEL
Cholesterol: 179 mg/dL (ref 0–200)
HDL: 50.3 mg/dL (ref >39.00)
LDL Cholesterol: 104 mg/dL — ABNORMAL HIGH (ref 0–99)
NonHDL: 128.31
Total CHOL/HDL Ratio: 4
Triglycerides: 124 mg/dL (ref 0.0–149.0)
VLDL: 24.8 mg/dL (ref 0.0–40.0)

## 2022-11-07 LAB — COMPREHENSIVE METABOLIC PANEL
ALT: 18 U/L (ref 0–35)
AST: 22 U/L (ref 0–37)
Albumin: 4.5 g/dL (ref 3.5–5.2)
Alkaline Phosphatase: 56 U/L (ref 39–117)
BUN: 13 mg/dL (ref 6–23)
CO2: 29 meq/L (ref 19–32)
Calcium: 9.8 mg/dL (ref 8.4–10.5)
Chloride: 103 meq/L (ref 96–112)
Creatinine, Ser: 0.57 mg/dL (ref 0.40–1.20)
GFR: 92.61 mL/min (ref >60.00)
Glucose, Bld: 100 mg/dL — ABNORMAL HIGH (ref 70–99)
Potassium: 4.6 meq/L (ref 3.5–5.1)
Sodium: 141 meq/L (ref 135–145)
Total Bilirubin: 0.4 mg/dL (ref 0.2–1.2)
Total Protein: 6.9 g/dL (ref 6.0–8.3)

## 2022-11-07 LAB — TSH: TSH: 0.48 u[IU]/mL (ref 0.35–5.50)

## 2022-11-07 MED ORDER — SILVER SULFADIAZINE 1 % EX CREA
1.0000 | TOPICAL_CREAM | Freq: Every day | CUTANEOUS | 0 refills | Status: DC
Start: 2022-11-07 — End: 2023-05-08
  Filled 2022-11-07: qty 50, 50d supply, fill #0

## 2022-11-07 MED ORDER — PREDNISONE 10 MG PO TABS
ORAL_TABLET | ORAL | 0 refills | Status: DC
  Filled 2022-11-07: qty 20, 12d supply, fill #0

## 2022-11-07 MED ORDER — NYSTATIN 100000 UNIT/GM EX POWD
1.0000 | Freq: Three times a day (TID) | CUTANEOUS | 0 refills | Status: DC
  Filled 2022-11-07: qty 15, 5d supply, fill #0

## 2022-11-07 NOTE — Patient Instructions (Signed)
Body Ringworm  Body ringworm is an infection of the skin that often causes a ring-shaped rash. Body ringworm is also called tinea corporis.  Body ringworm can affect any part of your skin. This condition is easily spread from person to person (is very contagious).  What are the causes?  This condition is caused by fungi called dermatophytes. The condition develops when these fungi grow out of control on the skin.  You can get this condition if you touch a person or animal that has it. You can also get it if you share any items with an infected person or pet. These include:  Clothing, bedding, and towels.  Brushes or combs.  Gym equipment.  Any other object that has the fungus on it.  What increases the risk?  You are more likely to develop this condition if you:  Play sports that involve close physical contact, such as wrestling.  Sweat a lot.  Live in areas that are hot and humid.  Use public showers.  Have a weakened disease-fighting system (immune system).  What are the signs or symptoms?    Symptoms of this condition include:  Itchy, raised red spots and bumps.  Red scaly patches.  A ring-shaped rash. The rash may have:  A clear center.  Scales or red bumps at its center.  Redness near its borders.  Dry and scaly skin on or around it.  How is this diagnosed?  This condition can usually be diagnosed with a skin exam. A skin scraping may be taken from the affected area and examined under a microscope to see if the fungus is present.  How is this treated?  This condition may be treated with:  An antifungal cream or ointment.  An antifungal shampoo.  Antifungal medicines. These may be prescribed if your ringworm:  Is severe.  Keeps coming back or lasts a long time.  Follow these instructions at home:  Take over-the-counter and prescription medicines only as told by your health care provider.  If you were given an antifungal cream or ointment:  Use it as told by your health care provider.  Wash the infected area and  dry it completely before applying the cream or ointment.  If you were given an antifungal shampoo:  Use it as told by your health care provider.  Leave the shampoo on your body for 3-5 minutes before rinsing.  While you have a rash:  Wear loose clothing to stop clothes from rubbing and irritating it.  Wash or change your bed sheets every night.  Wash clothes and bed sheets in hot water.  Disinfect or throw out items that may be infected.  Wash your hands often with soap and water for at least 20 seconds. If soap and water are not available, use hand sanitizer.  If your pet has the same infection, take your pet to see a veterinarian for treatment.  How is this prevented?  Take a bath or shower every day and after every time you work out or play sports.  Dry your skin completely after bathing.  Wear sandals or shoes in public places and showers.  Wash athletic clothes after each use.  Do not share personal items with others.  Avoid touching red patches of skin on other people.  Avoid touching pets that have bald spots.  If you touch an animal that has a bald spot, wash your hands.  Contact a health care provider if:  Your rash continues to spread after 7 days of  treatment.  Your rash is not gone in 4 weeks.  The area around your rash gets red, warm, tender, and swollen.  This information is not intended to replace advice given to you by your health care provider. Make sure you discuss any questions you have with your health care provider.  Document Revised: 06/16/2021 Document Reviewed: 06/16/2021  Elsevier Patient Education  2024 [employer].

## 2022-11-07 NOTE — Progress Notes (Signed)
Established Patient Office Visit  Subjective   Patient ID: Rachel Kline, female    DOB: [birth year]  Age: 67 y.o. MRN: [id number]  Chief Complaint  Patient presents with   Hyperlipidemia   Hypothyroidism   Follow-up    HPI Discussed the use of AI scribe software for clinical note transcription with the patient, who gave verbal consent to proceed.  History of Present Illness   The patient, with a history of recent COVID-19 infection, presents with a persistent rash that has been present for over a month. The rash is located under the breasts, in the groin area, up the back, and on the side of the head. The patient reports that the itching associated with the rash has improved but is not completely resolved. She was previously treated with fluconazole, doxycycline, and gynazole by urgent care.  In addition to the rash, the patient also has a burn on her lower back, which she initially thought might be a spider bite. The burn occurred a month ago and has been treated with triple antibiotic ointment. The patient reports that the burn is itchy.  She reports no problems with the Fosamax and is due for a bone density test next year.      Patient Active Problem List   Diagnosis Date Noted   Morbid obesity (HCC) 07/05/2017   Elevated BP without diagnosis of hypertension 07/05/2017   Hyperlipidemia LDL goal <100 01/02/2017   Preventative health care 01/02/2017   Hypothyroidism 05/15/2016   Rash 03/09/2016   History of colonic polyps 09/22/2014   Macular degeneration of both eyes 07/15/2012   FASCIITIS, PLANTAR 03/31/2009   Hyperlipidemia 12/09/2007   Past Medical History:  Diagnosis Date   Contact lens/glasses fitting    wears contacts or glasses   Hyperlipidemia    Macular degeneration 07/02/2012   Primary hypertension 10/24/2019   Past Surgical History:  Procedure Laterality Date   COLONOSCOPY     GANGLION CYST EXCISION  6/99   rt wrist   TONSILLECTOMY N/A 11/22/2012    Procedure: TONSILLECTOMY;  Surgeon: [name], MD;  Location: [facility];  Service: ENT;  Laterality: N/A;   Social History   Tobacco Use   Smoking status: Never   Smokeless tobacco: Never  Vaping Use   Vaping status: Never Used  Substance Use Topics   Alcohol use: Yes    Comment: rare   Drug use: No   Social History   Socioeconomic History   Marital status: Married    Spouse name: Not on file   Number of children: Not on file   Years of education: Not on file   Highest education level: Associate degree: occupational, Scientist, product/process development, or vocational program  Occupational History   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  Tobacco Use   Smoking status: Never   Smokeless tobacco: Never  Vaping Use   Vaping status: Never Used  Substance and Sexual Activity   Alcohol use: Yes    Comment: rare   Drug use: No   Sexual activity: Yes    Partners: Male  Other Topics Concern   Not on file  Social History Narrative   Exercise-- 60 min daily  4-5 days a week    Social Determinants of Health   Financial Resource Strain: Low Risk  (10/31/2022)   Overall Financial Resource Strain (CARDIA)  Difficulty of Paying Living Expenses: Not very hard  Food Insecurity: No Food Insecurity (10/31/2022)   Hunger Vital Sign    Worried About Running Out of Food in the Last Year: Never true    Ran Out of Food in the Last Year: Never true  Transportation Needs: No Transportation Needs (10/31/2022)   PRAPARE - Administrator, Civil Service (Medical): No    Lack of Transportation (Non-Medical): No  Physical Activity: Sufficiently Active (10/31/2022)   Exercise Vital Sign    Days of Exercise per Week: 5 days    Minutes of Exercise per Session: 50 min  Stress: No Stress Concern Present (10/31/2022)   [employer] of Occupational Health -  Occupational Stress Questionnaire    Feeling of Stress : Only a little  Social Connections: Moderately Integrated (10/31/2022)   Social Connection and Isolation Panel [NHANES]    Frequency of Communication with Friends and Family: Twice a week    Frequency of Social Gatherings with Friends and Family: Once a week    Attends Religious Services: 1 to 4 times per year    Active Member of [employer] or Organizations: No    Attends Engineer, structural: Not on file    Marital Status: Married  Catering manager Violence: Not on file   Family Status  Relation Name Status   Mother  Deceased at age 35       pancreatic    Father  Deceased at age 35       subdural hematoma   Brother  Armed forces training and education officer   Other  (Not Specified)  No partnership data on file   Family History  Problem Relation Age of Onset   Pancreatic cancer Mother    Diabetes Mother    Heart failure Father    Kidney failure Father        end stage   Heart disease Father        chf, cabg   Hypertension Father    Hypertension Brother    Hyperlipidemia Brother    Coronary artery disease Other        female 1st degree relative <50   No Known Allergies    Review of Systems  Constitutional:  Negative for fever and malaise/fatigue.  HENT:  Negative for congestion.   Eyes:  Negative for blurred vision.  Respiratory:  Negative for cough and shortness of breath.   Cardiovascular:  Negative for chest pain, palpitations and leg swelling.  Gastrointestinal:  Negative for abdominal pain, blood in stool, nausea and vomiting.  Genitourinary:  Negative for dysuria and frequency.  Musculoskeletal:  Negative for back pain and falls.  Skin:  Negative for rash.  Neurological:  Negative for dizziness, loss of consciousness and headaches.  Endo/Heme/Allergies:  Negative for environmental allergies.  Psychiatric/Behavioral:  Negative for depression. The patient is not nervous/anxious.       Objective:     BP (!) 140/82 (BP Location: Left  Arm, Patient Position: Sitting, Cuff Size: Large)   Pulse 72   Temp 98.1 F (36.7 C) (Oral)   Resp 16   Ht 5\' 4"  (1.626 m)   Wt 174 lb 12.8 oz (79.3 kg)   SpO2 98%   BMI 30.00 kg/m  BP Readings from Last 3 Encounters:  11/07/22 (!) 140/82  10/23/22 (!) 161/90  05/05/22 130/80   Wt Readings from Last 3 Encounters:  11/07/22 174 lb 12.8 oz (79.3 kg)  05/05/22 175 lb (79.4 kg)  11/01/21 174 lb 9.6 oz (  79.2 kg)   SpO2 Readings from Last 3 Encounters:  11/07/22 98%  10/23/22 95%  05/05/22 98%      Physical Exam Vitals and nursing note reviewed.  Constitutional:      General: She is not in acute distress.    Appearance: Normal appearance. She is well-developed.  HENT:     Head: Normocephalic and atraumatic.     Right Ear: Tympanic membrane, ear canal and external ear normal. There is no impacted cerumen.     Left Ear: Tympanic membrane, ear canal and external ear normal. There is no impacted cerumen.     Nose: Nose normal.     Mouth/Throat:     Mouth: Mucous membranes are moist.     Pharynx: Oropharynx is clear. No oropharyngeal exudate or posterior oropharyngeal erythema.  Eyes:     General: No scleral icterus.       Right eye: No discharge.        Left eye: No discharge.     Conjunctiva/sclera: Conjunctivae normal.     Pupils: Pupils are equal, round, and reactive to light.  Neck:     Thyroid: No thyromegaly or thyroid tenderness.     Vascular: No JVD.  Cardiovascular:     Rate and Rhythm: Normal rate and regular rhythm.     Heart sounds: Normal heart sounds. No murmur heard. Pulmonary:     Effort: Pulmonary effort is normal. No respiratory distress.     Breath sounds: Normal breath sounds.  Abdominal:     General: Bowel sounds are normal. There is no distension.     Palpations: Abdomen is soft. There is no mass.     Tenderness: There is no abdominal tenderness. There is no guarding or rebound.  Genitourinary:    Vagina: Normal.  Musculoskeletal:         General: Normal range of motion.     Cervical back: Normal range of motion and neck supple.     Right lower leg: No edema.     Left lower leg: No edema.  Lymphadenopathy:     Cervical: No cervical adenopathy.  Skin:    General: Skin is warm and dry.     Findings: No erythema or rash.  Neurological:     Mental Status: She is alert and oriented to person, place, and time.     Cranial Nerves: No cranial nerve deficit.     Deep Tendon Reflexes: Reflexes are normal and symmetric.  Psychiatric:        Mood and Affect: Mood normal.        Behavior: Behavior normal.        Thought Content: Thought content normal.        Judgment: Judgment normal.      No results found for any visits on 11/07/22.  Last CBC Lab Results  Component Value Date   WBC 4.7 05/05/2022   HGB 14.8 05/05/2022   HCT 43.1 05/05/2022   MCV 93.0 05/05/2022   RDW 12.4 05/05/2022   PLT 242.0 05/05/2022   Last metabolic panel Lab Results  Component Value Date   GLUCOSE 100 (H) 05/05/2022   NA 138 05/05/2022   K 4.8 05/05/2022   CL 102 05/05/2022   CO2 28 05/05/2022   BUN 11 05/05/2022   CREATININE 0.66 05/05/2022   GFR 89.71 05/05/2022   CALCIUM 9.4 05/05/2022   PROT 6.7 05/05/2022   ALBUMIN 4.6 05/05/2022   BILITOT 0.5 05/05/2022   ALKPHOS 54 05/05/2022   AST  24 05/05/2022   ALT 25 05/05/2022   Last lipids Lab Results  Component Value Date   CHOL 163 05/05/2022   HDL 49.30 05/05/2022   LDLCALC 89 05/05/2022   LDLDIRECT 149.7 03/31/2009   TRIG 121.0 05/05/2022   CHOLHDL 3 05/05/2022   Last hemoglobin A1c No results found for: "HGBA1C" Last thyroid functions Lab Results  Component Value Date   TSH 0.95 05/05/2022   T4TOTAL 7.9 10/24/2019   Last vitamin D Lab Results  Component Value Date   VD25OH 60.22 05/05/2022   Last vitamin B12 and Folate Lab Results  Component Value Date   VITAMINB12 857 03/30/2011      The 10-year ASCVD risk score (Arnett DK, et al., 2019) is: 9.7%     Assessment & Plan:   Problem List Items Addressed This Visit       Unprioritized   Hyperlipidemia LDL goal <100 - Primary   Relevant Orders   Comprehensive metabolic panel   Lipid panel   Hypothyroidism   Relevant Orders   TSH   Other Visit Diagnoses     Need for influenza vaccination       Relevant Medications   predniSONE (DELTASONE) 10 MG tablet   Other Relevant Orders   Flu Vaccine Trivalent High Dose (Fluad) (Completed)   Burn       Relevant Medications   silver sulfADIAZINE (SILVADENE) 1 % cream   Tinea corporis       Relevant Medications   nystatin powder   Age-related osteoporosis without current pathological fracture       Relevant Orders   DG Bone Density     Assessment and Plan    Cutaneous Candidiasis Persistent despite treatment with fluconazole, doxycycline, and gynazole. Itching improved but not resolved. Rash located under breasts, groin, and back. -Start Prednisone taper. -Use prescribed powder under breasts and in groin to keep area dry and prevent recurrence.  Thermal Burn One month old burn on back, possibly from heating pad. Treated with triple antibiotic ointment, but no Silvadene. -Prescribe Silvadene cream for burn.  General Health Maintenance -Scheduled for colonoscopy consult on 11/09/2022. -Order bone density test for April 2025, to be scheduled on the same day as mammogram. -Continue Fosamax. -No need for additional COVID-19 vaccinations at this time. -Next physical in 6 months.        No follow-ups on file.    [name], DO

## 2022-11-21 ENCOUNTER — Other Ambulatory Visit (HOSPITAL_BASED_OUTPATIENT_CLINIC_OR_DEPARTMENT_OTHER): Payer: Self-pay | Admitting: Family Medicine

## 2022-11-21 DIAGNOSIS — Z1231 Encounter for screening mammogram for malignant neoplasm of breast: Secondary | ICD-10-CM

## 2022-11-29 DIAGNOSIS — H353231 Exudative age-related macular degeneration, bilateral, with active choroidal neovascularization: Secondary | ICD-10-CM | POA: Diagnosis not present

## 2022-12-01 DIAGNOSIS — H43813 Vitreous degeneration, bilateral: Secondary | ICD-10-CM | POA: Diagnosis not present

## 2022-12-01 DIAGNOSIS — H353231 Exudative age-related macular degeneration, bilateral, with active choroidal neovascularization: Secondary | ICD-10-CM | POA: Diagnosis not present

## 2022-12-01 DIAGNOSIS — H25013 Cortical age-related cataract, bilateral: Secondary | ICD-10-CM | POA: Diagnosis not present

## 2022-12-01 DIAGNOSIS — H2513 Age-related nuclear cataract, bilateral: Secondary | ICD-10-CM | POA: Diagnosis not present

## 2022-12-04 ENCOUNTER — Other Ambulatory Visit: Payer: Self-pay | Admitting: Family Medicine

## 2022-12-04 DIAGNOSIS — E038 Other specified hypothyroidism: Secondary | ICD-10-CM

## 2022-12-05 ENCOUNTER — Other Ambulatory Visit (HOSPITAL_BASED_OUTPATIENT_CLINIC_OR_DEPARTMENT_OTHER): Payer: Self-pay

## 2022-12-05 MED ORDER — LEVOTHYROXINE SODIUM 50 MCG PO TABS
50.0000 ug | ORAL_TABLET | Freq: Every day | ORAL | 1 refills | Status: DC
  Filled 2022-12-05: qty 90, 90d supply, fill #0
  Filled 2023-03-05: qty 90, 90d supply, fill #1

## 2022-12-17 HISTORY — PX: CATARACT EXTRACTION: SUR2

## 2022-12-21 DIAGNOSIS — H2512 Age-related nuclear cataract, left eye: Secondary | ICD-10-CM | POA: Diagnosis not present

## 2022-12-21 DIAGNOSIS — H25812 Combined forms of age-related cataract, left eye: Secondary | ICD-10-CM | POA: Diagnosis not present

## 2022-12-21 DIAGNOSIS — H25012 Cortical age-related cataract, left eye: Secondary | ICD-10-CM | POA: Diagnosis not present

## 2022-12-26 ENCOUNTER — Other Ambulatory Visit (HOSPITAL_BASED_OUTPATIENT_CLINIC_OR_DEPARTMENT_OTHER): Payer: Self-pay

## 2022-12-26 MED ORDER — PEG-3350/ELECTROLYTES 236 G PO SOLR
4000.0000 mL | ORAL | 0 refills | Status: DC
  Filled 2022-12-26: qty 4000, 1d supply, fill #0

## 2022-12-26 MED ORDER — BISACODYL 5 MG PO TBEC
5.0000 mg | DELAYED_RELEASE_TABLET | ORAL | 0 refills | Status: DC
  Filled 2022-12-26: qty 100, 30d supply, fill #0

## 2022-12-27 ENCOUNTER — Other Ambulatory Visit (HOSPITAL_BASED_OUTPATIENT_CLINIC_OR_DEPARTMENT_OTHER): Payer: Self-pay

## 2023-01-04 DIAGNOSIS — H2511 Age-related nuclear cataract, right eye: Secondary | ICD-10-CM | POA: Diagnosis not present

## 2023-01-04 DIAGNOSIS — H25011 Cortical age-related cataract, right eye: Secondary | ICD-10-CM | POA: Diagnosis not present

## 2023-01-04 DIAGNOSIS — H25811 Combined forms of age-related cataract, right eye: Secondary | ICD-10-CM | POA: Diagnosis not present

## 2023-01-16 DIAGNOSIS — Z860100 Personal history of colon polyps, unspecified: Secondary | ICD-10-CM | POA: Diagnosis not present

## 2023-01-16 DIAGNOSIS — Z09 Encounter for follow-up examination after completed treatment for conditions other than malignant neoplasm: Secondary | ICD-10-CM | POA: Diagnosis not present

## 2023-01-16 DIAGNOSIS — Z83719 Family history of colon polyps, unspecified: Secondary | ICD-10-CM | POA: Diagnosis not present

## 2023-01-16 DIAGNOSIS — Z9889 Other specified postprocedural states: Secondary | ICD-10-CM | POA: Diagnosis not present

## 2023-01-16 LAB — HM COLONOSCOPY

## 2023-01-18 ENCOUNTER — Other Ambulatory Visit (HOSPITAL_BASED_OUTPATIENT_CLINIC_OR_DEPARTMENT_OTHER): Payer: Self-pay

## 2023-01-18 MED ORDER — SODIUM FLUORIDE 1.1 % DT GEL
1.0000 | Freq: Every day | DENTAL | 12 refills | Status: AC
  Filled 2023-01-18: qty 100, 30d supply, fill #0
  Filled 2023-01-18: qty 56, 30d supply, fill #0
  Filled 2023-02-17: qty 56, 30d supply, fill #1
  Filled 2023-03-26: qty 56, 30d supply, fill #2
  Filled 2023-05-16: qty 56, 30d supply, fill #3
  Filled 2023-06-15: qty 56, 30d supply, fill #4
  Filled 2023-07-23: qty 56, 30d supply, fill #5
  Filled 2023-08-29 (×2): qty 56, 30d supply, fill #6
  Filled 2023-10-04: qty 56, 30d supply, fill #7
  Filled 2023-10-30: qty 56, 30d supply, fill #8
  Filled 2023-11-25: qty 56, 30d supply, fill #9

## 2023-01-24 DIAGNOSIS — H353231 Exudative age-related macular degeneration, bilateral, with active choroidal neovascularization: Secondary | ICD-10-CM | POA: Diagnosis not present

## 2023-01-27 ENCOUNTER — Telehealth: Payer: Commercial Managed Care - PPO | Admitting: Family Medicine

## 2023-01-27 DIAGNOSIS — L309 Dermatitis, unspecified: Secondary | ICD-10-CM | POA: Diagnosis not present

## 2023-01-27 MED ORDER — PREDNISONE 10 MG (21) PO TBPK: ORAL_TABLET | ORAL | 0 refills | Status: DC

## 2023-01-27 NOTE — Progress Notes (Signed)
 E Visit for Rash  We are sorry that you are not feeling well. Here is how we plan to help!  Based on what you shared with me it looks like you have contact dermatitis.  Contact dermatitis is a skin rash caused by something that touches the skin and causes irritation or inflammation.  Your skin may be red, swollen, dry, cracked, and itch.  The rash should go away in a few days but can last a few weeks.  If you get a rash, it's important to figure out what caused it so the irritant can be avoided in the future.   I have prescribed Prednisone  10 mg daily for 6 days (please follow pharmacy instructions for taper)   HOME CARE:  Take cool showers and avoid direct sunlight. Apply cool compress or wet dressings. Take a bath in an oatmeal bath.  Sprinkle content of one Aveeno packet under running faucet with comfortably warm water.  Bathe for 15-20 minutes, 1-2 times daily.  Pat dry with a towel. Do not rub the rash. Use hydrocortisone cream. Take an antihistamine like Benadryl for widespread rashes that itch.  The adult dose of Benadryl is 25-50 mg by mouth 4 times daily. Caution:  This type of medication may cause sleepiness.  Do not drink alcohol, drive, or operate dangerous machinery while taking antihistamines.  Do not take these medications if you have prostate enlargement.  Read package instructions thoroughly on all medications that you take.  GET HELP RIGHT AWAY IF:  Symptoms don't go away after treatment. Severe itching that persists. If you rash spreads or swells. If you rash begins to smell. If it blisters and opens or develops a yellow-brown crust. You develop a fever. You have a sore throat. You become short of breath.  MAKE SURE YOU:  Understand these instructions. Will watch your condition. Will get help right away if you are not doing well or get worse.  Thank you for choosing an e-visit.  Your e-visit answers were reviewed by a board certified advanced clinical  practitioner to complete your personal care plan. Depending upon the condition, your plan could have included both over the counter or prescription medications.  Please review your pharmacy choice. Make sure the pharmacy is open so you can pick up prescription now. If there is a problem, you may contact your provider through [employer] and have the prescription routed to another pharmacy.  Your safety is important to us . If you have drug allergies check your prescription carefully.   For the next 24 hours you can use MyChart to ask questions about today's visit, request a non-urgent call back, or ask for a work or school excuse. You will get an email in the next two days asking about your experience. I hope that your e-visit has been valuable and will speed your recovery.  I have spent 5 minutes in review of e-visit questionnaire, review and updating patient chart, medical decision making and response to patient.   Roosvelt Mater, PA-C

## 2023-02-15 DIAGNOSIS — Z961 Presence of intraocular lens: Secondary | ICD-10-CM | POA: Diagnosis not present

## 2023-02-15 DIAGNOSIS — H353230 Exudative age-related macular degeneration, bilateral, stage unspecified: Secondary | ICD-10-CM | POA: Diagnosis not present

## 2023-02-17 ENCOUNTER — Other Ambulatory Visit: Payer: Self-pay | Admitting: Family Medicine

## 2023-02-19 ENCOUNTER — Other Ambulatory Visit (HOSPITAL_BASED_OUTPATIENT_CLINIC_OR_DEPARTMENT_OTHER): Payer: Self-pay

## 2023-02-19 ENCOUNTER — Other Ambulatory Visit: Payer: Self-pay

## 2023-02-19 MED ORDER — CETIRIZINE HCL 10 MG PO TABS
10.0000 mg | ORAL_TABLET | Freq: Every day | ORAL | 2 refills | Status: AC
  Filled 2023-02-19: qty 90, 90d supply, fill #0
  Filled 2023-06-04: qty 90, 90d supply, fill #1
  Filled 2023-08-29 (×2): qty 90, 90d supply, fill #2

## 2023-03-21 DIAGNOSIS — H353231 Exudative age-related macular degeneration, bilateral, with active choroidal neovascularization: Secondary | ICD-10-CM | POA: Diagnosis not present

## 2023-05-08 ENCOUNTER — Ambulatory Visit: Payer: 59 | Admitting: Family Medicine

## 2023-05-08 ENCOUNTER — Other Ambulatory Visit (HOSPITAL_BASED_OUTPATIENT_CLINIC_OR_DEPARTMENT_OTHER): Payer: Self-pay

## 2023-05-08 ENCOUNTER — Encounter: Payer: Self-pay | Admitting: Family Medicine

## 2023-05-08 VITALS — BP 136/68 | HR 66 | Temp 98.0°F | Resp 18 | Ht 64.0 in | Wt 178.0 lb

## 2023-05-08 DIAGNOSIS — E782 Mixed hyperlipidemia: Secondary | ICD-10-CM | POA: Diagnosis not present

## 2023-05-08 DIAGNOSIS — E039 Hypothyroidism, unspecified: Secondary | ICD-10-CM | POA: Diagnosis not present

## 2023-05-08 DIAGNOSIS — R21 Rash and other nonspecific skin eruption: Secondary | ICD-10-CM | POA: Diagnosis not present

## 2023-05-08 DIAGNOSIS — E785 Hyperlipidemia, unspecified: Secondary | ICD-10-CM

## 2023-05-08 LAB — COMPREHENSIVE METABOLIC PANEL WITH GFR
ALT: 26 U/L (ref 0–35)
AST: 25 U/L (ref 0–37)
Albumin: 4.5 g/dL (ref 3.5–5.2)
Alkaline Phosphatase: 43 U/L (ref 39–117)
BUN: 14 mg/dL (ref 6–23)
CO2: 29 meq/L (ref 19–32)
Calcium: 9.5 mg/dL (ref 8.4–10.5)
Chloride: 103 meq/L (ref 96–112)
Creatinine, Ser: 0.56 mg/dL (ref 0.40–1.20)
GFR: 92.68 mL/min (ref >60.00)
Glucose, Bld: 105 mg/dL — ABNORMAL HIGH (ref 70–99)
Potassium: 4.5 meq/L (ref 3.5–5.1)
Sodium: 140 meq/L (ref 135–145)
Total Bilirubin: 0.5 mg/dL (ref 0.2–1.2)
Total Protein: 6.6 g/dL (ref 6.0–8.3)

## 2023-05-08 LAB — LIPID PANEL
Cholesterol: 171 mg/dL (ref 0–200)
HDL: 52.4 mg/dL (ref >39.00)
LDL Cholesterol: 95 mg/dL (ref 0–99)
NonHDL: 118.69
Total CHOL/HDL Ratio: 3
Triglycerides: 120 mg/dL (ref 0.0–149.0)
VLDL: 24 mg/dL (ref 0.0–40.0)

## 2023-05-08 LAB — TSH: TSH: 0.97 u[IU]/mL (ref 0.35–5.50)

## 2023-05-08 MED ORDER — TRIAMCINOLONE ACETONIDE 0.1 % EX CREA
1.0000 | TOPICAL_CREAM | Freq: Two times a day (BID) | CUTANEOUS | 0 refills | Status: DC
Start: 2023-05-08 — End: 2023-08-12
  Filled 2023-05-08: qty 30, 15d supply, fill #0

## 2023-05-08 NOTE — Patient Instructions (Addendum)
                Elsevier Patient Education  2024 [employer].

## 2023-05-08 NOTE — Assessment & Plan Note (Addendum)
Encourage heart healthy diet such as MIND or DASH diet, increase exercise, avoid trans fats, simple carbohydrates and processed foods, consider a krill or fish or flaxseed oil cap daily.   Con't statin

## 2023-05-08 NOTE — Assessment & Plan Note (Signed)
Check tsh  Con't synthroid  

## 2023-05-08 NOTE — Progress Notes (Signed)
 Established Patient Office Visit  Subjective   Patient ID: Rachel Kline, female    DOB: [birth year]  Age: 68 y.o. MRN: [id number]  Chief Complaint  Patient presents with   Hyperlipidemia   Hypothyroidism   Follow-up    HPI Discussed the use of AI scribe software for clinical note transcription with the patient, who gave verbal consent to proceed.  History of Present Illness Rachel Kline "Rachel Kline" is a 68 year old female who presents with recurrent itchy rashes.  She experiences recurrent itchy rashes that manifest as spots on her skin. Currently, she has one spot, but in December and January, the rashes were more extensive, covering her back. She uses over-the-counter cortisone cream (Cortisone 10) for treatment, which provides some relief, but the rashes tend to recur. There have been no recent changes in soaps or detergents, and she continues to use Dove sensitive skin bar soap. She has not identified any specific triggers for the rashes, such as exposure to allergens or changes in her environment.  In December, she underwent cataract surgery on both eyes and a colonoscopy, which showed no polyps. Her mother had a challenging experience with colonoscopy preparation, requiring multiple attempts due to complications and COVID-19 infection.  No swelling in her ankles and no other significant symptoms were noted during the review of systems.   Patient Active Problem List   Diagnosis Date Noted   Morbid obesity (HCC) 07/05/2017   Elevated BP without diagnosis of hypertension 07/05/2017   Hyperlipidemia LDL goal <100 01/02/2017   Preventative health care 01/02/2017   Hypothyroidism 05/15/2016   Rash 03/09/2016   History of colonic polyps 09/22/2014   Macular degeneration of both eyes 07/15/2012   FASCIITIS, PLANTAR 03/31/2009   Hyperlipidemia 12/09/2007   Past Medical History:  Diagnosis Date   Contact lens/glasses fitting    wears contacts or glasses   Hyperlipidemia     Macular degeneration 07/02/2012   Primary hypertension 10/24/2019   Past Surgical History:  Procedure Laterality Date   CATARACT EXTRACTION Bilateral 12/2022   COLONOSCOPY     GANGLION CYST EXCISION  06/16/1997   rt wrist   TONSILLECTOMY N/A 11/22/2012   Procedure: TONSILLECTOMY;  Surgeon: Prescott Brodie, MD;  Location: [facility];  Service: ENT;  Laterality: N/A;   Social History   Tobacco Use   Smoking status: Never   Smokeless tobacco: Never  Vaping Use   Vaping status: Never Used  Substance Use Topics   Alcohol use: Yes    Comment: rare   Drug use: No   Social History   Socioeconomic History   Marital status: Married    Spouse name: Not on file   Number of children: Not on file   Years of education: Not on file   Highest education level: Associate degree: occupational, Scientist, product/process development, or vocational program  Occupational History   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  Tobacco Use   Smoking status: Never   Smokeless tobacco: Never  Vaping Use   Vaping status: Never Used  Substance and Sexual Activity   Alcohol use: Yes    Comment: rare   Drug use: No   Sexual activity: Yes    Partners: Male  Other Topics Concern   Not on file  Social History Narrative   Exercise-- 60 min daily  4-5 days a week  Social Drivers of Corporate investment banker Strain: Low Risk  (05/07/2023)   Overall Financial Resource Strain (CARDIA)    Difficulty of Paying Living Expenses: Not very hard  Food Insecurity: No Food Insecurity (05/07/2023)   Hunger Vital Sign    Worried About Running Out of Food in the Last Year: Never true    Ran Out of Food in the Last Year: Never true  Transportation Needs: No Transportation Needs (05/07/2023)   PRAPARE - Administrator, Civil Service (Medical): No    Lack of Transportation (Non-Medical): No   Physical Activity: Sufficiently Active (05/07/2023)   Exercise Vital Sign    Days of Exercise per Week: 5 days    Minutes of Exercise per Session: 40 min  Stress: No Stress Concern Present (05/07/2023)   [employer] of Occupational Health - Occupational Stress Questionnaire    Feeling of Stress : Not at all  Social Connections: Moderately Integrated (05/07/2023)   Social Connection and Isolation Panel [NHANES]    Frequency of Communication with Friends and Family: More than three times a week    Frequency of Social Gatherings with Friends and Family: Once a week    Attends Religious Services: 1 to 4 times per year    Active Member of [employer] or Organizations: No    Attends Engineer, structural: Not on file    Marital Status: Married  Catering manager Violence: Not on file   Family Status  Relation Name Status   Mother  Deceased at age 82       pancreatic    Father  Deceased at age 68       subdural hematoma   Brother  Armed forces training and education officer   Other  (Not Specified)  No partnership data on file   Family History  Problem Relation Age of Onset   Pancreatic cancer Mother    Diabetes Mother    Heart failure Father    Kidney failure Father        end stage   Heart disease Father        chf, cabg   Hypertension Father    Hypertension Brother    Hyperlipidemia Brother    Coronary artery disease Other        female 1st degree relative <50   No Known Allergies    Review of Systems  Constitutional:  Negative for fever and malaise/fatigue.  HENT:  Negative for congestion.   Eyes:  Negative for blurred vision.  Respiratory:  Negative for cough and shortness of breath.   Cardiovascular:  Negative for chest pain, palpitations and leg swelling.  Gastrointestinal:  Negative for vomiting.  Musculoskeletal:  Negative for back pain.  Skin:  Negative for rash.  Neurological:  Negative for loss of consciousness and headaches.      Objective:     BP 136/68 (BP Location: Left Arm,  Patient Position: Sitting, Cuff Size: Large)   Pulse 66   Temp 98 F (36.7 C) (Oral)   Resp 18   Ht 5\' 4"  (1.626 m)   Wt 178 lb (80.7 kg)   SpO2 96%   BMI 30.55 kg/m  BP Readings from Last 3 Encounters:  05/08/23 136/68  11/07/22 (!) 140/82  10/23/22 (!) 161/90   Wt Readings from Last 3 Encounters:  05/08/23 178 lb (80.7 kg)  11/07/22 174 lb 12.8 oz (79.3 kg)  05/05/22 175 lb (79.4 kg)   SpO2 Readings from Last 3 Encounters:  05/08/23 96%  11/07/22 98%  10/23/22 95%      Physical Exam Vitals and nursing note reviewed.  Constitutional:      General: She is not in acute distress.    Appearance: Normal appearance. She is well-developed.  HENT:     Head: Normocephalic and atraumatic.  Eyes:     General: No scleral icterus.       Right eye: No discharge.        Left eye: No discharge.  Cardiovascular:     Rate and Rhythm: Normal rate and regular rhythm.     Heart sounds: No murmur heard. Pulmonary:     Effort: Pulmonary effort is normal. No respiratory distress.     Breath sounds: Normal breath sounds.  Musculoskeletal:        General: Normal range of motion.     Cervical back: Normal range of motion and neck supple.     Right lower leg: No edema.     Left lower leg: No edema.  Skin:    General: Skin is warm and dry.     Findings: Erythema and rash present.  Neurological:     General: No focal deficit present.     Mental Status: She is alert and oriented to person, place, and time.  Psychiatric:        Mood and Affect: Mood normal.        Behavior: Behavior normal.        Thought Content: Thought content normal.        Judgment: Judgment normal.      No results found for any visits on 05/08/23.  Last CBC Lab Results  Component Value Date   WBC 4.7 05/05/2022   HGB 14.8 05/05/2022   HCT 43.1 05/05/2022   MCV 93.0 05/05/2022   RDW 12.4 05/05/2022   PLT 242.0 05/05/2022   Last metabolic panel Lab Results  Component Value Date   GLUCOSE 100 (H)  11/07/2022   NA 141 11/07/2022   K 4.6 11/07/2022   CL 103 11/07/2022   CO2 29 11/07/2022   BUN 13 11/07/2022   CREATININE 0.57 11/07/2022   GFR 92.61 11/07/2022   CALCIUM  9.8 11/07/2022   PROT 6.9 11/07/2022   ALBUMIN 4.5 11/07/2022   BILITOT 0.4 11/07/2022   ALKPHOS 56 11/07/2022   AST 22 11/07/2022   ALT 18 11/07/2022   Last lipids Lab Results  Component Value Date   CHOL 179 11/07/2022   HDL 50.30 11/07/2022   LDLCALC 104 (H) 11/07/2022   LDLDIRECT 149.7 03/31/2009   TRIG 124.0 11/07/2022   CHOLHDL 4 11/07/2022   Last hemoglobin A1c No results found for: "HGBA1C" Last thyroid  functions Lab Results  Component Value Date   TSH 0.48 11/07/2022   T4TOTAL 7.9 10/24/2019   Last vitamin D  Lab Results  Component Value Date   VD25OH 60.22 05/05/2022   Last vitamin B12 and Folate Lab Results  Component Value Date   VITAMINB12 857 03/30/2011      The 10-year ASCVD risk score (Arnett DK, et al., 2019) is: 10.5%    Assessment & Plan:   Problem List Items Addressed This Visit       Unprioritized   Rash   Relevant Medications   triamcinolone  cream (KENALOG ) 0.1 %   Hyperlipidemia LDL goal <100   Relevant Orders   Comprehensive metabolic panel with GFR   Lipid panel   Hypothyroidism - Primary   Check tsh Con't synthroid       Relevant Orders   TSH  Hyperlipidemia   Encourage heart healthy diet such as MIND or DASH diet, increase exercise, avoid trans fats, simple carbohydrates and processed foods, consider a krill or fish or flaxseed oil cap daily.       Assessment and Plan Assessment & Plan Itchy rash   She experiences an intermittent pruritic rash with xerosis and desquamation, previously responsive to prednisone  but recurrent. The rash is currently localized to one area with no identifiable trigger, and eczema is considered in the differential diagnosis. Over-the-counter cortisone cream provides partial relief but may worsen xerosis. Heat or sweating  may exacerbate the condition. Prescribe triamcinolone  cream. Instruct her to mix triamcinolone  cream with a thick moisturizer like Eucerin, Lubriderm, or CeraVe in a 1:3 ratio to prevent xerosis. Advise monitoring the rash and reporting if it worsens or does not improve. Refer to a dermatologist if the rash becomes severe or persistent.  Wellness Visit   She attended a routine wellness visit with up-to-date preventative screenings and vaccinations. She recently underwent cataract surgery and a colonoscopy in December, which found no polyps. Discussed the possibility of ceasing future colonoscopy screenings due to age.    No follow-ups on file.    Deidre Carino R Lowne Chase, DO

## 2023-05-09 ENCOUNTER — Encounter: Payer: Self-pay | Admitting: Family Medicine

## 2023-05-15 ENCOUNTER — Ambulatory Visit (HOSPITAL_BASED_OUTPATIENT_CLINIC_OR_DEPARTMENT_OTHER): Payer: 59

## 2023-05-15 ENCOUNTER — Other Ambulatory Visit (HOSPITAL_BASED_OUTPATIENT_CLINIC_OR_DEPARTMENT_OTHER): Payer: 59

## 2023-05-16 DIAGNOSIS — H353231 Exudative age-related macular degeneration, bilateral, with active choroidal neovascularization: Secondary | ICD-10-CM | POA: Diagnosis not present

## 2023-05-29 ENCOUNTER — Inpatient Hospital Stay (HOSPITAL_BASED_OUTPATIENT_CLINIC_OR_DEPARTMENT_OTHER): Admission: RE | Admit: 2023-05-29 | Payer: Self-pay | Source: Ambulatory Visit

## 2023-05-29 ENCOUNTER — Other Ambulatory Visit (HOSPITAL_BASED_OUTPATIENT_CLINIC_OR_DEPARTMENT_OTHER): Payer: Self-pay

## 2023-06-04 ENCOUNTER — Other Ambulatory Visit (HOSPITAL_BASED_OUTPATIENT_CLINIC_OR_DEPARTMENT_OTHER): Payer: Self-pay

## 2023-06-04 ENCOUNTER — Other Ambulatory Visit: Payer: Self-pay | Admitting: Family Medicine

## 2023-06-04 DIAGNOSIS — E038 Other specified hypothyroidism: Secondary | ICD-10-CM

## 2023-06-04 MED ORDER — LEVOTHYROXINE SODIUM 50 MCG PO TABS
50.0000 ug | ORAL_TABLET | Freq: Every day | ORAL | 1 refills | Status: DC
  Filled 2023-06-04: qty 90, 90d supply, fill #0
  Filled 2023-08-29 (×2): qty 90, 90d supply, fill #1

## 2023-06-05 ENCOUNTER — Other Ambulatory Visit: Payer: Self-pay

## 2023-06-05 ENCOUNTER — Other Ambulatory Visit (HOSPITAL_BASED_OUTPATIENT_CLINIC_OR_DEPARTMENT_OTHER): Payer: Self-pay

## 2023-07-04 IMAGING — MG MM DIGITAL SCREENING BILAT W/ TOMO AND CAD
8 series · 8 of 24 positions shown · non-contrast
Comparison: Previous exam(s).

CLINICAL DATA: Screening.

EXAM:
DIGITAL SCREENING BILATERAL MAMMOGRAM WITH TOMOSYNTHESIS AND CAD
TECHNIQUE: Bilateral screening digital craniocaudal and mediolateral oblique
mammograms were obtained. Bilateral screening digital breast
tomosynthesis was performed. The images were evaluated with
computer-aided detection.

[L MLO synth-2D]
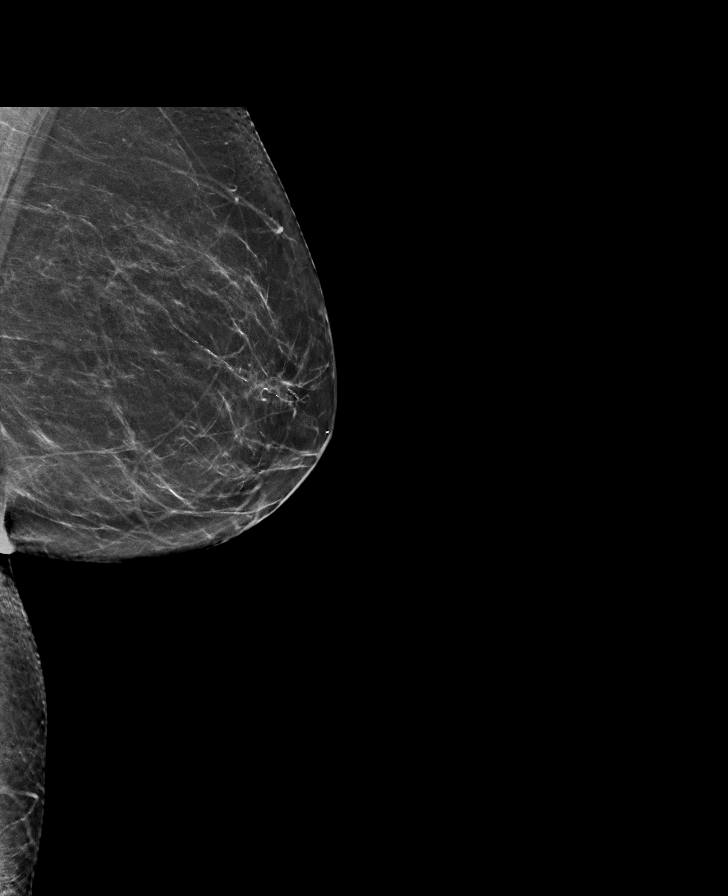

[L CC synth-2D]
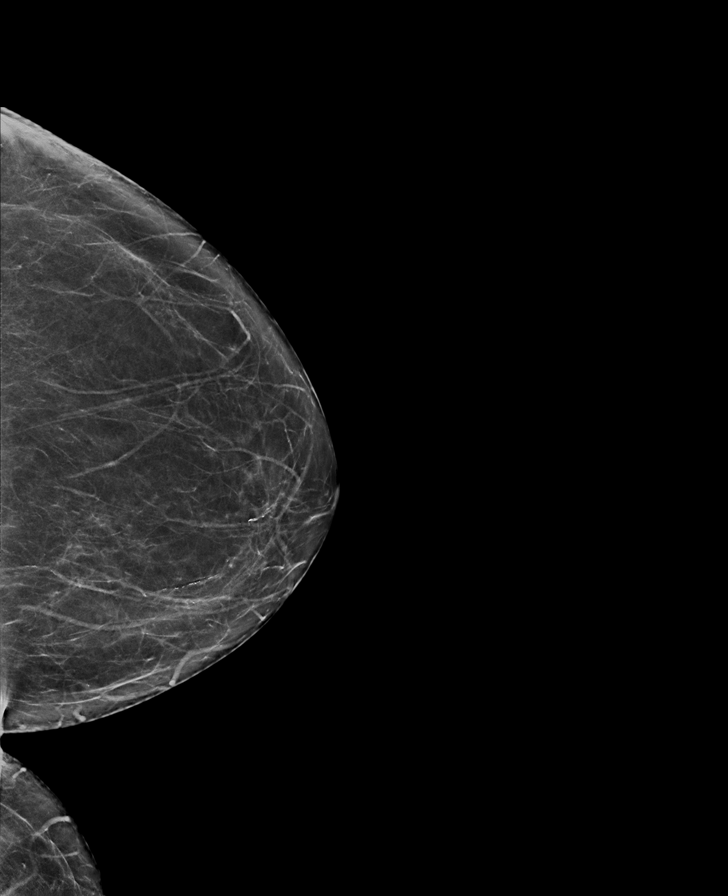

[R MLO synth-2D]
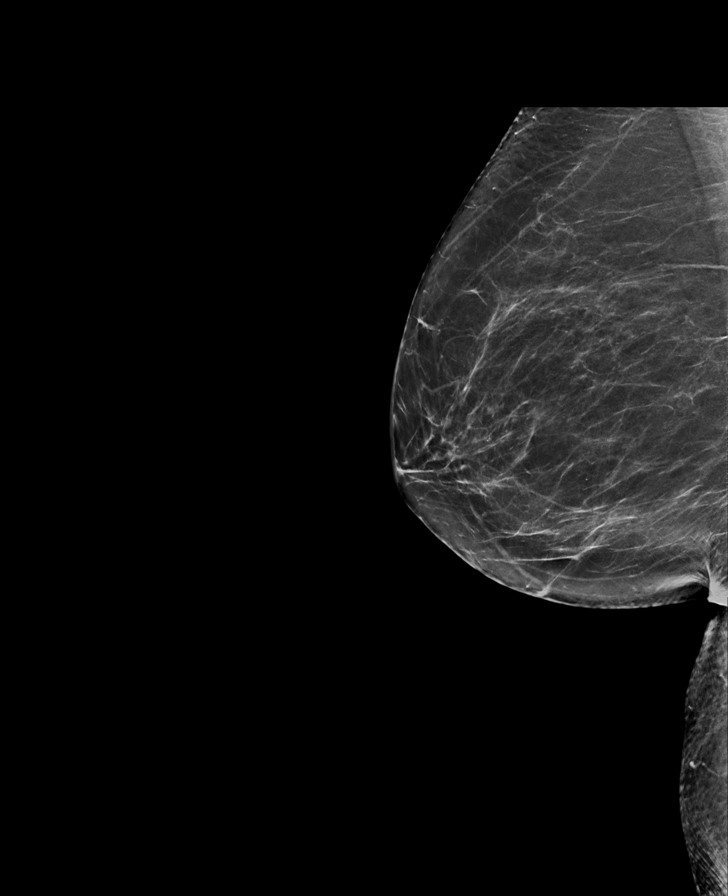

[R CC synth-2D]
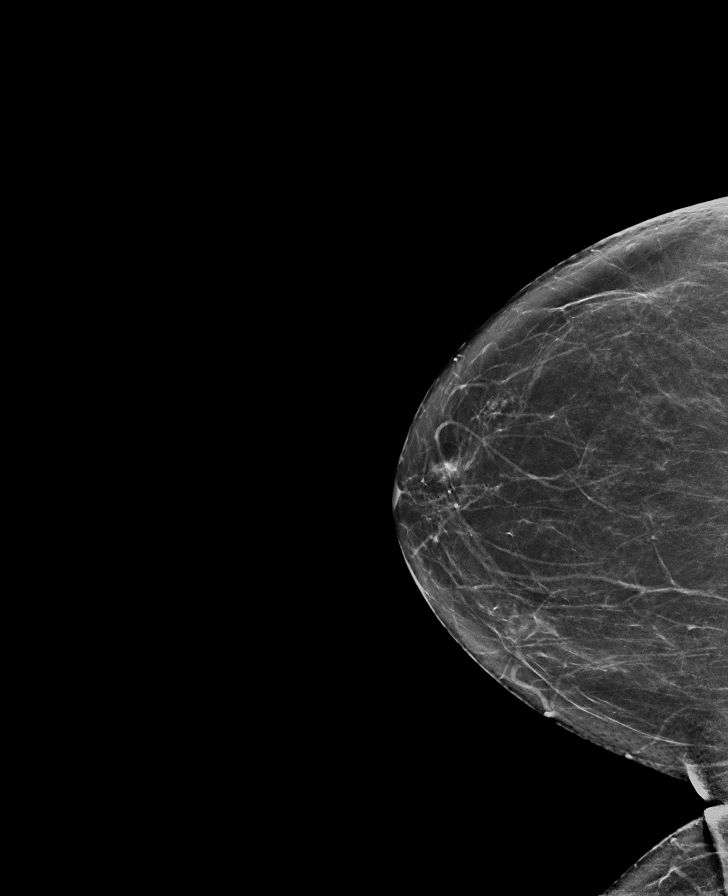

[R CC tomo · tomo slice 33/65.0]
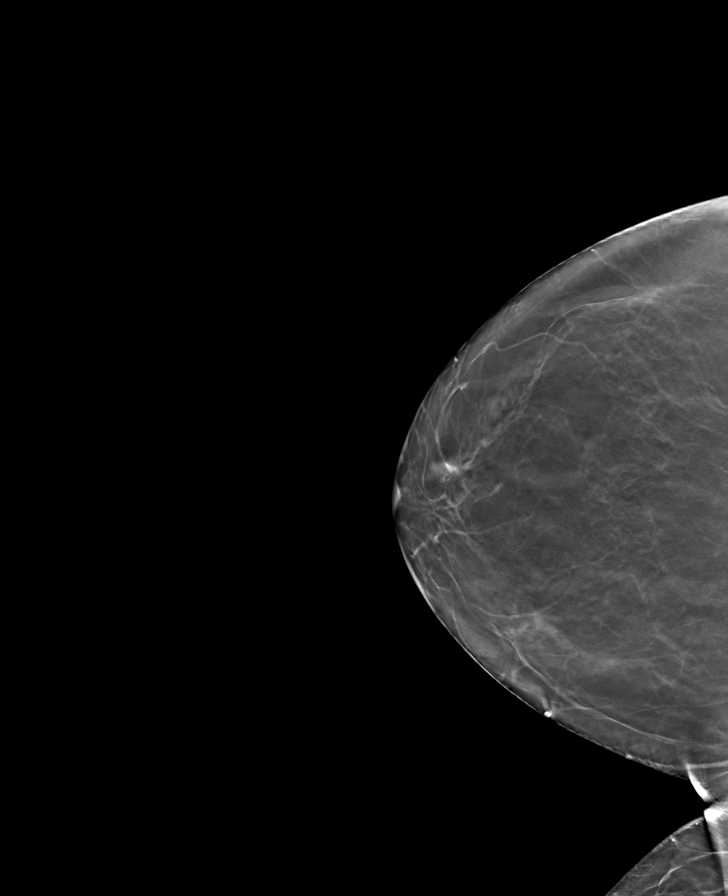

[L CC tomo · tomo slice 32/63.0]
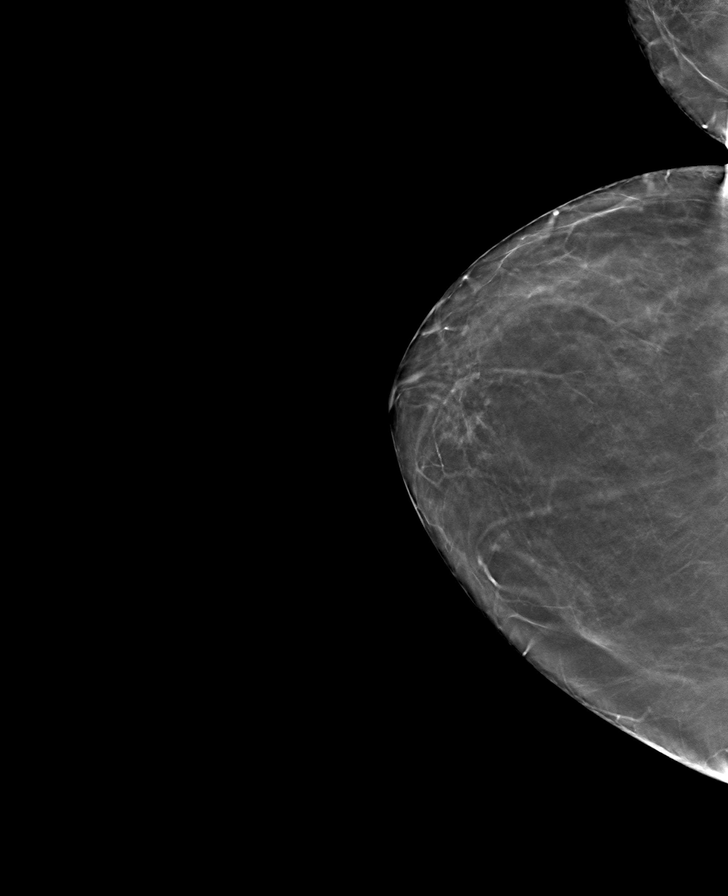

[L MLO tomo · tomo slice 33/64.0]
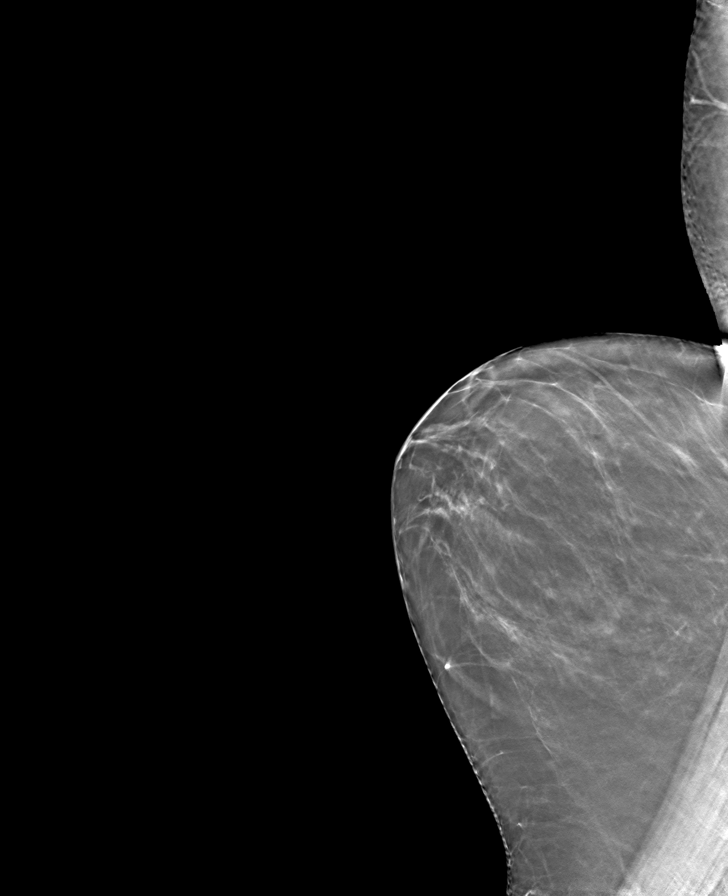

[R MLO tomo · tomo slice 34/67.0]
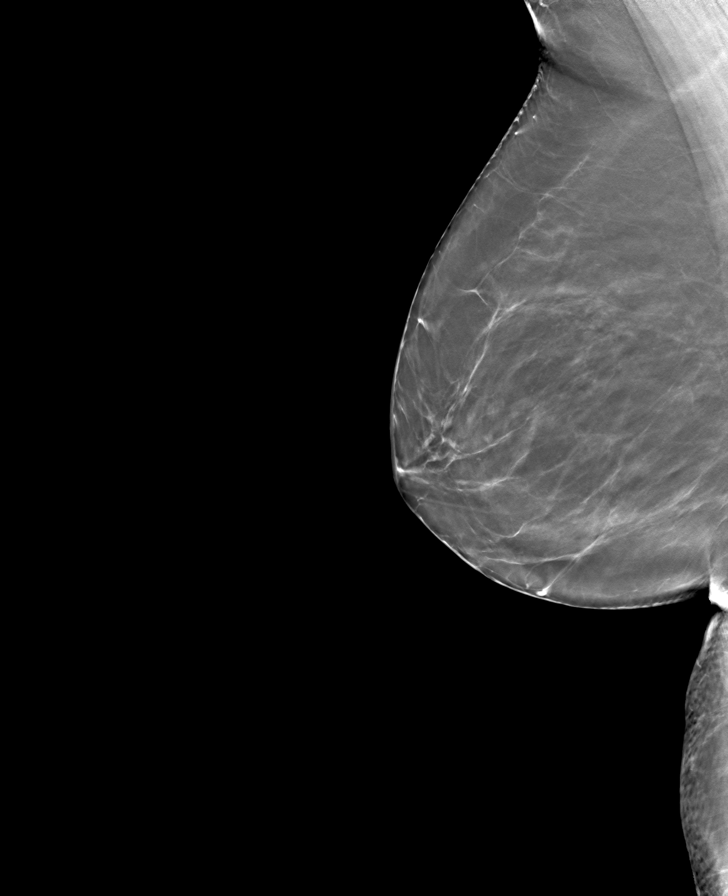

[8 of 24 positions shown; findings below may reference images not displayed]

ACR Breast Density Category b: There are scattered areas of
fibroglandular density.
FINDINGS: There are no findings suspicious for malignancy.
IMPRESSION: No mammographic evidence of malignancy. A result letter of this
screening mammogram will be mailed directly to the patient.

RECOMMENDATION:
Screening mammogram in one year. (Code:51-O-LD2)

BI-RADS CATEGORY  1: Negative.

## 2023-07-10 ENCOUNTER — Ambulatory Visit (HOSPITAL_BASED_OUTPATIENT_CLINIC_OR_DEPARTMENT_OTHER)
Admission: RE | Admit: 2023-07-10 | Discharge: 2023-07-10 | Disposition: A | Discharge status: Home / Self Care | Source: Ambulatory Visit | Attending: Family Medicine | Admitting: Family Medicine

## 2023-07-10 ENCOUNTER — Encounter (HOSPITAL_BASED_OUTPATIENT_CLINIC_OR_DEPARTMENT_OTHER): Payer: Self-pay

## 2023-07-10 ENCOUNTER — Other Ambulatory Visit (HOSPITAL_BASED_OUTPATIENT_CLINIC_OR_DEPARTMENT_OTHER)

## 2023-07-10 DIAGNOSIS — M810 Age-related osteoporosis without current pathological fracture: Secondary | ICD-10-CM | POA: Insufficient documentation

## 2023-07-10 DIAGNOSIS — Z1231 Encounter for screening mammogram for malignant neoplasm of breast: Secondary | ICD-10-CM | POA: Diagnosis not present

## 2023-07-15 ENCOUNTER — Ambulatory Visit: Payer: Self-pay | Admitting: Family Medicine

## 2023-07-18 DIAGNOSIS — H353231 Exudative age-related macular degeneration, bilateral, with active choroidal neovascularization: Secondary | ICD-10-CM | POA: Diagnosis not present

## 2023-08-12 ENCOUNTER — Other Ambulatory Visit: Payer: Self-pay | Admitting: Family Medicine

## 2023-08-12 DIAGNOSIS — R21 Rash and other nonspecific skin eruption: Secondary | ICD-10-CM

## 2023-08-12 DIAGNOSIS — E785 Hyperlipidemia, unspecified: Secondary | ICD-10-CM

## 2023-08-13 ENCOUNTER — Other Ambulatory Visit (HOSPITAL_BASED_OUTPATIENT_CLINIC_OR_DEPARTMENT_OTHER): Payer: Self-pay

## 2023-08-13 MED ORDER — TRIAMCINOLONE ACETONIDE 0.1 % EX CREA
1.0000 | TOPICAL_CREAM | Freq: Two times a day (BID) | CUTANEOUS | 0 refills | Status: DC
Start: 2023-08-13 — End: 2023-11-06
  Filled 2023-08-13: qty 30, 15d supply, fill #0

## 2023-08-13 MED ORDER — ATORVASTATIN CALCIUM 20 MG PO TABS
20.0000 mg | ORAL_TABLET | Freq: Every day | ORAL | 1 refills | Status: AC
  Filled 2023-08-13: qty 90, 90d supply, fill #0
  Filled 2023-11-12: qty 90, 90d supply, fill #1

## 2023-08-30 ENCOUNTER — Other Ambulatory Visit: Payer: Self-pay

## 2023-08-30 ENCOUNTER — Other Ambulatory Visit (HOSPITAL_BASED_OUTPATIENT_CLINIC_OR_DEPARTMENT_OTHER): Payer: Self-pay

## 2023-09-10 ENCOUNTER — Other Ambulatory Visit (HOSPITAL_BASED_OUTPATIENT_CLINIC_OR_DEPARTMENT_OTHER): Payer: Self-pay

## 2023-09-10 ENCOUNTER — Other Ambulatory Visit: Payer: Self-pay | Admitting: Family Medicine

## 2023-09-10 DIAGNOSIS — M810 Age-related osteoporosis without current pathological fracture: Secondary | ICD-10-CM

## 2023-09-10 MED ORDER — ALENDRONATE SODIUM 70 MG PO TABS
70.0000 mg | ORAL_TABLET | ORAL | 3 refills | Status: AC
  Filled 2023-09-10: qty 12, 84d supply, fill #0
  Filled 2023-11-25: qty 12, 84d supply, fill #1

## 2023-09-19 DIAGNOSIS — H353231 Exudative age-related macular degeneration, bilateral, with active choroidal neovascularization: Secondary | ICD-10-CM | POA: Diagnosis not present

## 2023-10-05 ENCOUNTER — Encounter: Payer: Self-pay | Admitting: Pharmacist

## 2023-10-05 ENCOUNTER — Other Ambulatory Visit (HOSPITAL_COMMUNITY): Payer: Self-pay

## 2023-10-05 ENCOUNTER — Other Ambulatory Visit: Payer: Self-pay

## 2023-10-30 ENCOUNTER — Other Ambulatory Visit (HOSPITAL_COMMUNITY): Payer: Self-pay

## 2023-11-06 ENCOUNTER — Other Ambulatory Visit (HOSPITAL_BASED_OUTPATIENT_CLINIC_OR_DEPARTMENT_OTHER): Payer: Self-pay

## 2023-11-06 ENCOUNTER — Ambulatory Visit: Admitting: Family Medicine

## 2023-11-06 ENCOUNTER — Encounter: Payer: Self-pay | Admitting: Family Medicine

## 2023-11-06 VITALS — BP 136/76 | HR 70 | Temp 98.1°F | Resp 16 | Ht 64.0 in | Wt 178.2 lb

## 2023-11-06 DIAGNOSIS — Z23 Encounter for immunization: Secondary | ICD-10-CM

## 2023-11-06 DIAGNOSIS — E785 Hyperlipidemia, unspecified: Secondary | ICD-10-CM

## 2023-11-06 DIAGNOSIS — H3530 Unspecified macular degeneration: Secondary | ICD-10-CM

## 2023-11-06 DIAGNOSIS — E039 Hypothyroidism, unspecified: Secondary | ICD-10-CM | POA: Diagnosis not present

## 2023-11-06 DIAGNOSIS — E782 Mixed hyperlipidemia: Secondary | ICD-10-CM

## 2023-11-06 DIAGNOSIS — M810 Age-related osteoporosis without current pathological fracture: Secondary | ICD-10-CM

## 2023-11-06 DIAGNOSIS — R21 Rash and other nonspecific skin eruption: Secondary | ICD-10-CM | POA: Diagnosis not present

## 2023-11-06 LAB — COMPREHENSIVE METABOLIC PANEL WITH GFR
ALT: 35 U/L (ref 0–35)
AST: 29 U/L (ref 0–37)
Albumin: 4.8 g/dL (ref 3.5–5.2)
Alkaline Phosphatase: 46 U/L (ref 39–117)
BUN: 16 mg/dL (ref 6–23)
CO2: 28 meq/L (ref 19–32)
Calcium: 9.6 mg/dL (ref 8.4–10.5)
Chloride: 102 meq/L (ref 96–112)
Creatinine, Ser: 0.66 mg/dL (ref 0.40–1.20)
GFR: 88.77 mL/min (ref >60.00)
Glucose, Bld: 108 mg/dL — ABNORMAL HIGH (ref 70–99)
Potassium: 4.4 meq/L (ref 3.5–5.1)
Sodium: 140 meq/L (ref 135–145)
Total Bilirubin: 0.4 mg/dL (ref 0.2–1.2)
Total Protein: 6.9 g/dL (ref 6.0–8.3)

## 2023-11-06 LAB — CBC WITH DIFFERENTIAL/PLATELET
Basophils Absolute: 0 K/uL (ref 0.0–0.1)
Basophils Relative: 1 % (ref 0.0–3.0)
Eosinophils Absolute: 0.1 K/uL (ref 0.0–0.7)
Eosinophils Relative: 2.1 % (ref 0.0–5.0)
HCT: 45 % (ref 36.0–46.0)
Hemoglobin: 15.2 g/dL — ABNORMAL HIGH (ref 12.0–15.0)
Lymphocytes Relative: 27.1 % (ref 12.0–46.0)
Lymphs Abs: 1.3 K/uL (ref 0.7–4.0)
MCHC: 33.8 g/dL (ref 30.0–36.0)
MCV: 94.5 fl (ref 78.0–100.0)
Monocytes Absolute: 0.3 K/uL (ref 0.1–1.0)
Monocytes Relative: 7.3 % (ref 3.0–12.0)
Neutro Abs: 3 K/uL (ref 1.4–7.7)
Neutrophils Relative %: 62.5 % (ref 43.0–77.0)
Platelets: 247 K/uL (ref 150.0–400.0)
RBC: 4.76 Mil/uL (ref 3.87–5.11)
RDW: 12.6 % (ref 11.5–15.5)
WBC: 4.8 K/uL (ref 4.0–10.5)

## 2023-11-06 LAB — TSH: TSH: 1.36 u[IU]/mL (ref 0.35–5.50)

## 2023-11-06 LAB — LIPID PANEL
Cholesterol: 172 mg/dL (ref 0–200)
HDL: 58 mg/dL (ref >39.00)
LDL Cholesterol: 94 mg/dL (ref 0–99)
NonHDL: 113.86
Total CHOL/HDL Ratio: 3
Triglycerides: 97 mg/dL (ref 0.0–149.0)
VLDL: 19.4 mg/dL (ref 0.0–40.0)

## 2023-11-06 LAB — VITAMIN D 25 HYDROXY (VIT D DEFICIENCY, FRACTURES): VITD: 91.79 ng/mL (ref 30.00–100.00)

## 2023-11-06 MED ORDER — TRIAMCINOLONE ACETONIDE 0.1 % EX CREA
2.0000 | TOPICAL_CREAM | Freq: Two times a day (BID) | CUTANEOUS | 2 refills | Status: AC
  Filled 2023-11-06: qty 30, 8d supply, fill #0
  Filled 2023-11-09 (×2): qty 30, 8d supply, fill #1
  Filled 2023-11-25: qty 30, 8d supply, fill #2

## 2023-11-06 NOTE — Assessment & Plan Note (Signed)
Con't synthroid  Check tsh 

## 2023-11-06 NOTE — Progress Notes (Signed)
 Subjective:    Patient ID: Rachel Kline, female    DOB: [birth year], 68 y.o.   MRN: [id number]  Chief Complaint  Patient presents with   Hypothyroidism   Hyperlipidemia   Follow-up    HPI Patient is in today for f/u chol and thyroid ..  Discussed the use of AI scribe software for clinical note transcription with the patient, who gave verbal consent to proceed.  History of Present Illness Rachel Kline is a 68 year old female who presents for a routine follow-up visit.  She has no complaints regarding her eye condition.  She is currently taking Lipitor, Synthroid , and Fosamax . She does not require refills for these medications at this time but needs a refill for her cream, which is available at the pharmacy downstairs.  She has not received her flu shot yet. She is not currently receiving COVID shots and has not decided on the RSV vaccine.  No swelling in the ankles is reported, and she has been engaging in yard work recently. She has no big trips planned and anticipates a smaller family gathering for Christmas as her stepdaughter will be traveling to [place]    Past Medical History:  Diagnosis Date   Contact lens/glasses fitting    wears contacts or glasses   Hyperlipidemia    Macular degeneration 07/02/2012   Primary hypertension 10/24/2019    Past Surgical History:  Procedure Laterality Date   CATARACT EXTRACTION Bilateral 12/2022   COLONOSCOPY     GANGLION CYST EXCISION  06/16/1997   rt wrist   TONSILLECTOMY N/A 11/22/2012   Procedure: TONSILLECTOMY;  Surgeon: Lonni FORBES Angle, MD;  Location: [facility];  Service: ENT;  Laterality: N/A;    Family History  Problem Relation Age of Onset   Pancreatic cancer Mother    Diabetes Mother    Heart failure Father    Kidney failure Father        end stage   Heart disease Father        chf, cabg   Hypertension Father    Hypertension Brother    Hyperlipidemia Brother    Coronary  artery disease Other        female 1st degree relative <50    Social History   Socioeconomic History   Marital status: Married    Spouse name: Not on file   Number of children: Not on file   Years of education: Not on file   Highest education level: Associate degree: occupational, Scientist, product/process development, or vocational program  Occupational History   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  Tobacco Use   Smoking status: Never   Smokeless tobacco: Never  Vaping Use   Vaping status: Never Used  Substance and Sexual Activity   Alcohol use: Yes    Comment: rare   Drug use: No   Sexual activity: Yes    Partners: Male  Other Topics Concern   Not on file  Social History Narrative   Exercise-- 60 min daily  4-5 days a week    Social Drivers of Corporate investment banker Strain: Low Risk  (10/30/2023)   Overall Financial Resource Strain (CARDIA)    Difficulty of Paying Living Expenses: Not very hard  Food Insecurity: No Food Insecurity (10/30/2023)   Hunger Vital Sign    Worried About Running Out of Food in the  Last Year: Never true    Ran Out of Food in the Last Year: Never true  Transportation Needs: No Transportation Needs (10/30/2023)   PRAPARE - Administrator, Civil Service (Medical): No    Lack of Transportation (Non-Medical): No  Physical Activity: Sufficiently Active (10/30/2023)   Exercise Vital Sign    Days of Exercise per Week: 5 days    Minutes of Exercise per Session: 40 min  Stress: No Stress Concern Present (10/30/2023)   [employer] of Occupational Health - Occupational Stress Questionnaire    Feeling of Stress: Only a little  Social Connections: Moderately Integrated (10/30/2023)   Social Connection and Isolation Panel    Frequency of Communication with Friends and Family: More than three times a week    Frequency of Social Gatherings with  Friends and Family: Once a week    Attends Religious Services: 1 to 4 times per year    Active Member of [employer] or Organizations: No    Attends Engineer, structural: Not on file    Marital Status: Married  Catering manager Violence: Not on file    Outpatient Medications Prior to Visit  Medication Sig Dispense Refill   alendronate  (FOSAMAX ) 70 MG tablet Take 1 tablet (70 mg total) by mouth once a week. 12 tablet 3   atorvastatin  (LIPITOR) 20 MG tablet Take 1 tablet (20 mg total) by mouth daily. 90 tablet 1   cetirizine  (ZYRTEC ) 10 MG tablet Take 1 tablet (10 mg total) by mouth daily. 100 tablet 2   levothyroxine  (SYNTHROID ) 50 MCG tablet Take 1 tablet (50 mcg total) by mouth daily before breakfast. 90 tablet 1   sodium fluoride  (FLUORISHIELD) 1.1 % GEL dental gel Apply a thin ribbon or pea-sized amount to toothbrush and brush teeth for at least 2 minutes. Spit excess. 112 mL 12   triamcinolone  cream (KENALOG ) 0.1 % Apply 1 Application topically 2 (two) times daily. 30 g 0   Calcium -Vitamin D -Vitamin K  500-500-40 MG-UNT-MCG CHEW 1 po tid 60 tablet    Coenzyme Q10 (COQ10) 30 MG CAPS Take by mouth 2 (two) times daily.     Multiple Vitamins-Iron  (MULTIVITAMINS WITH IRON ) TABS Take 1 tablet by mouth daily.  0   Multiple Vitamins-Minerals (ICAPS AREDS 2) CAPS Take 2 capsules by mouth daily.     Omega-3 350 MG CAPS Take by mouth.     No facility-administered medications prior to visit.    No Known Allergies  Review of Systems  Constitutional:  Negative for chills, fever and malaise/fatigue.  HENT:  Negative for congestion and hearing loss.   Eyes:  Negative for blurred vision and discharge.  Respiratory:  Negative for cough, sputum production and shortness of breath.   Cardiovascular:  Negative for chest pain, palpitations and leg swelling.  Gastrointestinal:  Negative for abdominal pain, blood in stool, constipation, diarrhea, heartburn, nausea and vomiting.  Genitourinary:   Negative for dysuria, frequency, hematuria and urgency.  Musculoskeletal:  Negative for back pain, falls and myalgias.  Skin:  Negative for rash.  Neurological:  Negative for dizziness, sensory change, loss of consciousness, weakness and headaches.  Endo/Heme/Allergies:  Negative for environmental allergies. Does not bruise/bleed easily.  Psychiatric/Behavioral:  Negative for depression and suicidal ideas. The patient is not nervous/anxious and does not have insomnia.        Objective:    Physical Exam Vitals and nursing note reviewed.  Constitutional:      General: She is not in acute distress.  Appearance: Normal appearance. She is well-developed.  HENT:     Head: Normocephalic and atraumatic.     Right Ear: Tympanic membrane, ear canal and external ear normal. There is no impacted cerumen.     Left Ear: Tympanic membrane, ear canal and external ear normal. There is no impacted cerumen.     Nose: Nose normal.     Mouth/Throat:     Mouth: Mucous membranes are moist.     Pharynx: Oropharynx is clear. No oropharyngeal exudate or posterior oropharyngeal erythema.  Eyes:     General: No scleral icterus.       Right eye: No discharge.        Left eye: No discharge.     Conjunctiva/sclera: Conjunctivae normal.     Pupils: Pupils are equal, round, and reactive to light.  Neck:     Thyroid : No thyromegaly or thyroid  tenderness.     Vascular: No JVD.  Cardiovascular:     Rate and Rhythm: Normal rate and regular rhythm.     Heart sounds: Normal heart sounds. No murmur heard. Pulmonary:     Effort: Pulmonary effort is normal. No respiratory distress.     Breath sounds: Normal breath sounds.  Abdominal:     General: Bowel sounds are normal. There is no distension.     Palpations: Abdomen is soft. There is no mass.     Tenderness: There is no abdominal tenderness. There is no guarding or rebound.  Genitourinary:    Vagina: Normal.  Musculoskeletal:        General: Normal range of  motion.     Cervical back: Normal range of motion and neck supple.     Right lower leg: No edema.     Left lower leg: No edema.  Lymphadenopathy:     Cervical: No cervical adenopathy.  Skin:    General: Skin is warm and dry.     Findings: No erythema or rash.  Neurological:     Mental Status: She is alert and oriented to person, place, and time.     Cranial Nerves: No cranial nerve deficit.     Deep Tendon Reflexes: Reflexes are normal and symmetric.  Psychiatric:        Mood and Affect: Mood normal.        Behavior: Behavior normal.        Thought Content: Thought content normal.        Judgment: Judgment normal.     BP 136/76 (BP Location: Left Arm, Patient Position: Sitting, Cuff Size: Large)   Pulse 70   Temp 98.1 F (36.7 C) (Oral)   Resp 16   Ht 5' 4 (1.626 m)   Wt 178 lb 3.2 oz (80.8 kg)   SpO2 95%   BMI 30.59 kg/m  Wt Readings from Last 3 Encounters:  11/06/23 178 lb 3.2 oz (80.8 kg)  05/08/23 178 lb (80.7 kg)  11/07/22 174 lb 12.8 oz (79.3 kg)    Diabetic Foot Exam - Simple   No data filed    Lab Results  Component Value Date   WBC 4.7 05/05/2022   HGB 14.8 05/05/2022   HCT 43.1 05/05/2022   PLT 242.0 05/05/2022   GLUCOSE 105 (H) 05/08/2023   CHOL 171 05/08/2023   TRIG 120.0 05/08/2023   HDL 52.40 05/08/2023   LDLDIRECT 149.7 03/31/2009   LDLCALC 95 05/08/2023   ALT 26 05/08/2023   AST 25 05/08/2023   NA 140 05/08/2023   K 4.5 05/08/2023  CL 103 05/08/2023   CREATININE 0.56 05/08/2023   BUN 14 05/08/2023   CO2 29 05/08/2023   TSH 0.97 05/08/2023    Lab Results  Component Value Date   TSH 0.97 05/08/2023   Lab Results  Component Value Date   WBC 4.7 05/05/2022   HGB 14.8 05/05/2022   HCT 43.1 05/05/2022   MCV 93.0 05/05/2022   PLT 242.0 05/05/2022   Lab Results  Component Value Date   NA 140 05/08/2023   K 4.5 05/08/2023   CO2 29 05/08/2023   GLUCOSE 105 (H) 05/08/2023   BUN 14 05/08/2023   CREATININE 0.56 05/08/2023    BILITOT 0.5 05/08/2023   ALKPHOS 43 05/08/2023   AST 25 05/08/2023   ALT 26 05/08/2023   PROT 6.6 05/08/2023   ALBUMIN 4.5 05/08/2023   CALCIUM  9.5 05/08/2023   GFR 92.68 05/08/2023   Lab Results  Component Value Date   CHOL 171 05/08/2023   Lab Results  Component Value Date   HDL 52.40 05/08/2023   Lab Results  Component Value Date   LDLCALC 95 05/08/2023   Lab Results  Component Value Date   TRIG 120.0 05/08/2023   Lab Results  Component Value Date   CHOLHDL 3 05/08/2023   No results found for: HGBA1C     Assessment & Plan:  Hyperlipidemia LDL goal <100 -     CBC with Differential/Platelet -     Comprehensive metabolic panel with GFR -     Lipid panel  Rash -     Triamcinolone  Acetonide; Apply topically 2 (two) times daily.  Dispense: 30 g; Refill: 2  Need for influenza vaccination -     Flu vaccine HIGH DOSE PF(Fluzone Trivalent)  Age-related osteoporosis without current pathological fracture -     VITAMIN D  25 Hydroxy (Vit-D Deficiency, Fractures)  Hypothyroidism, unspecified type Assessment & Plan: Con't synthroid  Check tsh  Orders: -     CBC with Differential/Platelet -     TSH  Macular degeneration of both eyes, unspecified type  Mixed hyperlipidemia Assessment & Plan: Encourage heart healthy diet such as MIND or DASH diet, increase exercise, avoid trans fats, simple carbohydrates and processed foods, consider a krill or fish or flaxseed oil cap daily.     Assessment and Plan Assessment & Plan Rash   Requires ongoing management with a cream. Refill cream prescription.    Dearia Wilmouth R Lowne Chase, DO

## 2023-11-06 NOTE — Assessment & Plan Note (Signed)
 Encourage heart healthy diet such as MIND or DASH diet, increase exercise, avoid trans fats, simple carbohydrates and processed foods, consider a krill or fish or flaxseed oil cap daily.

## 2023-11-09 ENCOUNTER — Other Ambulatory Visit (HOSPITAL_COMMUNITY): Payer: Self-pay

## 2023-11-11 ENCOUNTER — Ambulatory Visit: Payer: Self-pay | Admitting: Family Medicine

## 2023-11-12 ENCOUNTER — Other Ambulatory Visit: Payer: Self-pay

## 2023-11-14 ENCOUNTER — Other Ambulatory Visit (HOSPITAL_COMMUNITY): Payer: Self-pay

## 2023-11-21 DIAGNOSIS — H353231 Exudative age-related macular degeneration, bilateral, with active choroidal neovascularization: Secondary | ICD-10-CM | POA: Diagnosis not present

## 2023-11-25 ENCOUNTER — Other Ambulatory Visit: Payer: Self-pay | Admitting: Family Medicine

## 2023-11-25 DIAGNOSIS — E038 Other specified hypothyroidism: Secondary | ICD-10-CM

## 2023-11-26 ENCOUNTER — Other Ambulatory Visit: Payer: Self-pay

## 2023-11-26 ENCOUNTER — Other Ambulatory Visit (HOSPITAL_COMMUNITY): Payer: Self-pay

## 2023-11-26 MED ORDER — LEVOTHYROXINE SODIUM 50 MCG PO TABS
50.0000 ug | ORAL_TABLET | Freq: Every day | ORAL | 1 refills | Status: AC
  Filled 2023-11-26: qty 90, 90d supply, fill #0

## 2024-01-03 ENCOUNTER — Telehealth: Admitting: Physician Assistant

## 2024-01-03 DIAGNOSIS — J069 Acute upper respiratory infection, unspecified: Secondary | ICD-10-CM

## 2024-01-03 NOTE — Progress Notes (Signed)
 We are sorry you are not feeling well.  Here is how we plan to help!  Based on what you have shared with me, it looks like you may have a viral upper respiratory infection.  Upper respiratory infections are caused by a large number of viruses; however, rhinovirus is the most common cause.  As a precaution I recommend taking a home COVID/Flu test, notifying us  if positive for either ASAP.  Symptoms vary from person to person, with common symptoms including sore throat, cough, and fatigue or lack of energy, and a feeling of general discomfort.  A low-grade fever of up to 100.4 may present, but is often uncommon.  Symptoms vary however, and are closely related to a person's age or underlying illnesses.  The most common symptoms associated with an upper respiratory infection are nasal discharge or congestion, cough, sneezing, headache and pressure in the ears and face.  These symptoms usually persist for about 3 to 10 days, but can last up to 2 weeks.  It is important to know that upper respiratory infections do not cause serious illness or complications in most cases.    Upper respiratory infections can be transmitted from person to person, with the most common method of transmission being a person's hands.  The virus is able to live on the skin and can infect other persons for up to 2 hours after direct contact.  Also, these can be transmitted when someone coughs or sneezes; thus, it is important to cover the mouth to reduce this risk.  To keep the spread of the illness at bay, good hand hygiene is very important!  Because this is a viral infection, there are no specific treatments other than to help you with the symptoms until the infection runs its course.    For nasal congestion, you may use an oral decongestants such as Mucinex D or if you have glaucoma or high blood pressure use plain Mucinex.  Saline nasal spray or nasal drops can help and can safely be used as often as needed for congestion.   If  you do not have a history of heart disease, hypertension, diabetes or thyroid  disease, prostate/bladder issues or glaucoma, you may also use Sudafed to treat nasal congestion.  It is highly recommended that you consult with a pharmacist or your primary care physician to ensure this medication is safe for you to take.     If you have a cough, you may use over-the-counter cough suppressants such as Delsym and Robitussin.  If you have glaucoma or high blood pressure, you can also use Coricidin HBP.     If you have a sore or scratchy throat, use a saltwater gargle-  to  teaspoon of salt dissolved in a 4-ounce to 8-ounce glass of warm water.  Gargle the solution for approximately 15-30 seconds and then spit.  It is important not to swallow the solution.  You can also use throat lozenges/cough drops and Chloraseptic spray to help with throat pain or discomfort.  Warm or cold liquids can also be helpful in relieving throat pain.   For headache, pain, or general discomfort, you can use Ibuprofen or Tylenol  as directed.   Some authorities believe that zinc sprays or the use of Echinacea may shorten the course of your symptoms.   HOME CARE Only take medications as instructed by your medical team. Be sure to drink plenty of fluids. Water is fine as well as fruit juices, sodas and electrolyte beverages. You may want to stay  away from caffeine or alcohol. If you are nauseated, try taking small sips of liquids. How do you know if you are getting enough fluid? Your urine should be a pale yellow or almost colorless. Get rest. Taking a steamy shower or using a humidifier may help nasal congestion and ease sore throat pain. You can place a towel over your head and breathe in the steam from hot water coming from a faucet. Using a saline nasal spray works much the same way. Cough drops, hard candies and sore throat lozenges may ease your cough. Avoid close contacts especially the very young and the elderly Cover  your mouth if you cough or sneeze Always remember to wash your hands.   GET HELP RIGHT AWAY IF: You develop worsening fever. If your symptoms do not improve within 10 days You develop yellow or green discharge from your nose over 3 days. You have coughing fits You develop a severe head ache or visual changes. You develop shortness of breath, difficulty breathing or start having chest pain Your symptoms persist after you have completed your treatment plan  MAKE SURE YOU  Understand these instructions. Will watch your condition. Will get help right away if you are not doing well or get worse.  Your e-visit answers were reviewed by a board certified advanced clinical practitioner to complete your personal care plan. Depending upon the condition, your plan could have included both over-the-counter or prescription medications.  Please review your pharmacy choice. If there is a problem, you may call our nursing hot line at and have the prescription routed to another pharmacy. Your safety is important to us . If you have drug allergies, check your prescription carefully.   You can use MyChart to ask questions about todays visit, request a non-urgent call back, or ask for a work or school excuse for 24 hours related to this e-Visit. If it has been greater than 24 hours you will need to follow up with your provider, or enter a new e-Visit to address those concerns. You will get an e-mail in the next two days asking about your experience.  I hope that your e-visit has been valuable and will speed your recovery. Thank you for using e-visits.   I have spent 5 minutes in review of e-visit questionnaire, review and updating patient chart, medical decision making and response to patient.   Elsie Velma Lunger, PA-C

## 2024-05-06 ENCOUNTER — Ambulatory Visit: Admitting: Family Medicine
